# Patient Record
Sex: Male | Born: 1975
Health system: Southern US, Community
[De-identification: ages and names within clinical notes are randomized; demographics above are authoritative.]

## PROBLEM LIST (undated history)

## (undated) DIAGNOSIS — E785 Hyperlipidemia, unspecified: Secondary | ICD-10-CM

## (undated) DIAGNOSIS — B019 Varicella without complication: Secondary | ICD-10-CM

## (undated) DIAGNOSIS — L732 Hidradenitis suppurativa: Secondary | ICD-10-CM

## (undated) DIAGNOSIS — K612 Anorectal abscess: Secondary | ICD-10-CM

## (undated) DIAGNOSIS — I10 Essential (primary) hypertension: Secondary | ICD-10-CM

## (undated) DIAGNOSIS — L081 Erythrasma: Secondary | ICD-10-CM

## (undated) DIAGNOSIS — E781 Pure hyperglyceridemia: Secondary | ICD-10-CM

## (undated) HISTORY — DX: Varicella without complication: B01.9

## (undated) HISTORY — DX: Pure hyperglyceridemia: E78.1

## (undated) HISTORY — DX: Anorectal abscess: K61.2

## (undated) HISTORY — DX: Essential (primary) hypertension: I10

## (undated) HISTORY — DX: Erythrasma: L08.1

## (undated) HISTORY — DX: Hyperlipidemia, unspecified: E78.5

---

## 2003-10-26 ENCOUNTER — Ambulatory Visit: Payer: Self-pay | Admitting: General Practice

## 2005-02-22 ENCOUNTER — Ambulatory Visit: Payer: Self-pay | Admitting: General Practice

## 2006-05-01 ENCOUNTER — Ambulatory Visit: Payer: Self-pay | Admitting: General Practice

## 2007-10-27 ENCOUNTER — Ambulatory Visit: Payer: Self-pay | Admitting: General Practice

## 2017-04-25 ENCOUNTER — Ambulatory Visit: Payer: BLUE CROSS/BLUE SHIELD | Admitting: Primary Care

## 2017-04-25 ENCOUNTER — Encounter: Payer: Self-pay | Admitting: Primary Care

## 2017-04-25 ENCOUNTER — Encounter (INDEPENDENT_AMBULATORY_CARE_PROVIDER_SITE_OTHER): Payer: Self-pay

## 2017-04-25 VITALS — BP 154/94 | HR 93 | Temp 98.1°F | Ht 72.75 in | Wt 312.5 lb

## 2017-04-25 DIAGNOSIS — E781 Pure hyperglyceridemia: Secondary | ICD-10-CM | POA: Diagnosis not present

## 2017-04-25 DIAGNOSIS — I10 Essential (primary) hypertension: Secondary | ICD-10-CM | POA: Diagnosis not present

## 2017-04-25 HISTORY — DX: Essential (primary) hypertension: I10

## 2017-04-25 HISTORY — DX: Pure hyperglyceridemia: E78.1

## 2017-04-25 LAB — COMPREHENSIVE METABOLIC PANEL
ALT: 27 U/L (ref 0–53)
AST: 26 U/L (ref 0–37)
Albumin: 4.3 g/dL (ref 3.5–5.2)
Alkaline Phosphatase: 57 U/L (ref 39–117)
BILIRUBIN TOTAL: 0.6 mg/dL (ref 0.2–1.2)
BUN: 13 mg/dL (ref 6–23)
CO2: 26 meq/L (ref 19–32)
CREATININE: 0.8 mg/dL (ref 0.40–1.50)
Calcium: 9.4 mg/dL (ref 8.4–10.5)
Chloride: 99 mEq/L (ref 96–112)
GFR: 113.22 mL/min (ref >60.00)
GLUCOSE: 90 mg/dL (ref 70–99)
Potassium: 4.1 mEq/L (ref 3.5–5.1)
Sodium: 135 mEq/L (ref 135–145)
Total Protein: 8 g/dL (ref 6.0–8.3)

## 2017-04-25 LAB — HEMOGLOBIN A1C: HEMOGLOBIN A1C: 5 % (ref 4.6–6.5)

## 2017-04-25 LAB — LIPID PANEL
Cholesterol: 233 mg/dL — ABNORMAL HIGH (ref 0–200)
HDL: 38.2 mg/dL — AB (ref >39.00)
Total CHOL/HDL Ratio: 6
Triglycerides: 602 mg/dL — ABNORMAL HIGH (ref 0.0–149.0)

## 2017-04-25 LAB — LDL CHOLESTEROL, DIRECT: Direct LDL: 122 mg/dL

## 2017-04-25 MED ORDER — FENOFIBRATE 160 MG PO TABS: ORAL_TABLET | ORAL | 3 refills | Status: DC

## 2017-04-25 MED ORDER — AMLODIPINE BESYLATE 10 MG PO TABS: ORAL_TABLET | ORAL | 0 refills | Status: DC

## 2017-04-25 NOTE — Assessment & Plan Note (Signed)
Out of fenofibrate for several weeks, refills provided today. Discussed the importance of a healthy diet and regular exercise in order for weight loss, and to reduce the risk of any potential medical problems. Lipid panel pending today.

## 2017-04-25 NOTE — Progress Notes (Signed)
Subjective:    Patient ID: [name], male    DOB: 1975/01/31, 42 y.o.   MRN: [id number]  HPI  [name] is a 42 year old male who presents today to establish care and discuss the problems mentioned below. Will obtain old records.  1) Essential Hypertension: Diagnosed 5-6 years ago. He has family history of hypertension in mother and both sets of grandparents. Currently managed on lisinopril 20 mg. He would like to switch from lisinopril to another medication due to decrease in sexual drive. He ran out of his lisinopril 20 mg several weeks ago.   He checks his BP at home when on the medication which runs 130's/70's.   BP Readings from Last 3 Encounters:  04/25/17 (!) 154/94     2) Hypertriglyceridemia: Currently managed on fenofibrate 160 mg. His last cholesterol check was over one year ago. He ran out of his fenofibrate several weeks ago.   Diet currently consists of:  Breakfast: English muffin, bacon, egg whites; granola bar Lunch: Take out, fast food Dinner: Chicken, vegetables, steak, starch Snacks: Nuts, granola bar Desserts: None Beverages: Regular green tea, water, occasional soda, occasional alcohol  Exercise: He is exercising at home daily   Review of Systems  Constitutional: Negative for fatigue.  Eyes: Negative for visual disturbance.  Respiratory: Negative for shortness of breath.   Cardiovascular: Negative for chest pain.  Neurological: Negative for dizziness and headaches.  Psychiatric/Behavioral: The patient is nervous/anxious.        Past Medical History:  Diagnosis Date  . Hyperlipidemia   . Hypertension      Social History   Socioeconomic History  . Marital status: Single    Spouse name: Not on file  . Number of children: Not on file  . Years of education: Not on file  . Highest education level: Not on file  Occupational History  . Not on file  Social Needs  . Financial resource strain: Not on file  . Food insecurity:   Worry: Not on file    Inability: Not on file  . Transportation needs:    Medical: Not on file    Non-medical: Not on file  Tobacco Use  . Smoking status: Never Smoker  . Smokeless tobacco: Never Used  Substance and Sexual Activity  . Alcohol use: Yes  . Drug use: Never  . Sexual activity: Not on file  Lifestyle  . Physical activity:    Days per week: Not on file    Minutes per session: Not on file  . Stress: Not on file  Relationships  . Social connections:    Talks on phone: Not on file    Gets together: Not on file    Attends religious service: Not on file    Active member of club or organization: Not on file    Attends meetings of clubs or organizations: Not on file    Relationship status: Not on file  . Intimate partner violence:    Fear of current or ex partner: Not on file    Emotionally abused: Not on file    Physically abused: Not on file    Forced sexual activity: Not on file  Other Topics Concern  . Not on file  Social History Narrative   Married.   No children.   Works as Medical illustrator.    Enjoys playing guitar, lifting weights, walking.     Family History  Problem Relation Age of Onset  . Arthritis Mother   .  Diabetes Mother   . Heart disease Mother   . Hyperlipidemia Mother   . Hypertension Mother   . Cancer Father        lung  . COPD Father   . Arthritis Sister   . Arthritis Maternal Grandmother   . Diabetes Maternal Grandmother   . Heart disease Maternal Grandmother   . Hyperlipidemia Maternal Grandmother   . Hypertension Maternal Grandmother   . Arthritis Maternal Grandfather   . Heart attack Maternal Grandfather   . Heart disease Maternal Grandfather   . Hyperlipidemia Maternal Grandfather   . Hypertension Maternal Grandfather   . Arthritis Paternal Grandmother   . Melanoma Paternal Grandmother   . Hyperlipidemia Paternal Grandmother   . Hypertension Paternal Grandmother   . Arthritis Paternal Grandfather   . Diabetes  Paternal Grandfather   . Heart attack Paternal Grandfather   . Hyperlipidemia Paternal Grandfather   . Hypertension Paternal Grandfather     No Known Allergies  Current Outpatient Medications on File Prior to Visit  Medication Sig Dispense Refill  . APPLE CIDER VINEGAR PO Take 450 mg by mouth daily.    . B Complex-Biotin-FA (B-COMPLEX PO) Take 35 mg by mouth daily.    . famotidine (PEPCID) 20 MG tablet Take 20 mg by mouth daily.    . Misc Natural Products (RELAX & SLEEP PO) Take 325 mg by mouth daily.    Marland Kitchen. UNABLE TO FIND Take 2,000 mg by mouth 2 (two) times daily. Med Name: BEET ROOT     No current facility-administered medications on file prior to visit.     BP (!) 154/94   Pulse 93   Temp 98.1 F (36.7 C) (Oral)   Ht 6' 0.75" (1.848 m)   Wt (!) 312 lb 8 oz (141.7 kg)   SpO2 98%   BMI 41.51 kg/m    Objective:   Physical Exam  Constitutional: He is oriented to person, place, and time. He appears well-nourished.  Neck: Neck supple.  Cardiovascular: Normal rate and regular rhythm.  Pulmonary/Chest: Effort normal and breath sounds normal. He has no wheezes. He has no rales.  Neurological: He is alert and oriented to person, place, and time.  Skin: Skin is warm and dry.  Psychiatric: He has a normal mood and affect.          Assessment & Plan:

## 2017-04-25 NOTE — Assessment & Plan Note (Signed)
Above goal, off BP meds for 2 weeks. Rx for Amlodipine 10 mg tablets sent to pharmacy given side effects of decreased libido with lisinopril.  Follow up in 2-3 weeks for blood pressure check

## 2017-04-25 NOTE — Patient Instructions (Addendum)
Stop by the lab prior to leaving today. I will notify you of your results once received.   Start amlodipine 10 mg tablets for high blood pressure. Take 1 tablet by mouth once daily.   Resume your fenofibrate tablets.   Schedule a follow up visit in 2-3 weeks for blood pressure check and anxiety.   It was a pleasure to meet you today! Please don't hesitate to call or message me with any questions. Welcome to [facility]!

## 2017-05-09 ENCOUNTER — Encounter: Payer: Self-pay | Admitting: Primary Care

## 2017-05-09 ENCOUNTER — Ambulatory Visit: Payer: BLUE CROSS/BLUE SHIELD | Admitting: Primary Care

## 2017-05-09 VITALS — BP 160/84 | HR 110 | Temp 98.0°F | Ht 72.75 in | Wt 313.0 lb

## 2017-05-09 DIAGNOSIS — F411 Generalized anxiety disorder: Secondary | ICD-10-CM

## 2017-05-09 DIAGNOSIS — E781 Pure hyperglyceridemia: Secondary | ICD-10-CM

## 2017-05-09 DIAGNOSIS — I10 Essential (primary) hypertension: Secondary | ICD-10-CM | POA: Diagnosis not present

## 2017-05-09 MED ORDER — ESCITALOPRAM OXALATE 10 MG PO TABS: 10.0000 mg | ORAL_TABLET | Freq: Every day | ORAL | 1 refills | Status: DC

## 2017-05-09 NOTE — Progress Notes (Signed)
Subjective:    Patient ID: [name], male    DOB: 01/12/1976, 42 y.o.   MRN: [id number]  HPI  [name] is a 42 year old male who presents today for follow up of hypertension.   He was last evaluated in mid April 2019 with complaints of elevated blood pressure readings since off of lisinopril 20 mg. He was switched to Amlodipine 10 mg as he experienced decreased sexual libido with lisinopril 20 mg.   BP Readings from Last 3 Encounters:  05/09/17 (!) 160/92  04/25/17 (!) 154/94   Since his last visit he's been checking his BP at home which has been running 130/80's, sometimes lower. 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 He denies headaches, dizziness, chest pain.   He thinks his elevated blood pressure readings are secondary to stress and anxiety. He endorses daily worry, anxiety, stress. Symptoms have been present for about one year and are affecting his work and home life. He is interested in trying treatment for anxiety. GAD 7 score of 12 today.   Review of Systems  Eyes: Negative for visual disturbance.  Respiratory: Negative for shortness of breath.   Cardiovascular: Negative for chest pain.  Neurological: Negative for dizziness and headaches.  Psychiatric/Behavioral: The patient is nervous/anxious.        See HPI       Past Medical History:  Diagnosis Date  . Hyperlipidemia   . Hypertension      Social History   Socioeconomic History  . Marital status: Single    Spouse name: Not on file  . Number of children: Not on file  . Years of education: Not on file  . Highest education level: Not on file  Occupational History  . Not on file  Social Needs  . Financial resource strain: Not on file  . Food insecurity:    Worry: Not on file    Inability: Not on file  . Transportation needs:    Medical: Not on file    Non-medical: Not on file  Tobacco Use  .  Smoking status: Never Smoker  . Smokeless tobacco: Never Used  Substance and Sexual Activity  . Alcohol use: Yes  . Drug use: Never  . Sexual activity: Not on file  Lifestyle  . Physical activity:    Days per week: Not on file    Minutes per session: Not on file  . Stress: Not on file  Relationships  . Social connections:    Talks on phone: Not on file    Gets together: Not on file    Attends religious service: Not on file    Active member of club or organization: Not on file    Attends meetings of clubs or organizations: Not on file    Relationship status: Not on file  . Intimate partner violence:    Fear of current or ex partner: Not on file    Emotionally abused: Not on file    Physically abused: Not on file    Forced sexual activity: Not on file  Other Topics Concern  . Not on file  Social History Narrative   Married.   No children.   Works as Medical illustratorengineering technician.    Enjoys playing guitar, lifting weights, walking.      Family History  Problem Relation Age of Onset  . Arthritis Mother   . Diabetes Mother   .  Heart disease Mother   . Hyperlipidemia Mother   . Hypertension Mother   . Cancer Father        lung  . COPD Father   . Arthritis Sister   . Arthritis Maternal Grandmother   . Diabetes Maternal Grandmother   . Heart disease Maternal Grandmother   . Hyperlipidemia Maternal Grandmother   . Hypertension Maternal Grandmother   . Arthritis Maternal Grandfather   . Heart attack Maternal Grandfather   . Heart disease Maternal Grandfather   . Hyperlipidemia Maternal Grandfather   . Hypertension Maternal Grandfather   . Arthritis Paternal Grandmother   . Melanoma Paternal Grandmother   . Hyperlipidemia Paternal Grandmother   . Hypertension Paternal Grandmother   . Arthritis Paternal Grandfather   . Diabetes Paternal Grandfather   . Heart attack Paternal Grandfather   . Hyperlipidemia Paternal Grandfather   . Hypertension Paternal Grandfather     No  Known Allergies  Current Outpatient Medications on File Prior to Visit  Medication Sig Dispense Refill  . amLODipine (NORVASC) 10 MG tablet Take 1 tablet by mouth once daily for high blood pressure. 30 tablet 0  . APPLE CIDER VINEGAR PO Take 450 mg by mouth daily.    . B Complex-Biotin-FA (B-COMPLEX PO) Take 35 mg by mouth daily.    . famotidine (PEPCID) 20 MG tablet Take 20 mg by mouth daily.    . fenofibrate 160 MG tablet Take 1 tablet by mouth once daily for high cholesterol. 90 tablet 3  . Misc Natural Products (RELAX & SLEEP PO) Take 325 mg by mouth daily.    Marland Kitchen UNABLE TO FIND Take 2,000 mg by mouth 2 (two) times daily. Med Name: BEET ROOT     No current facility-administered medications on file prior to visit.     BP (!) 160/92 (BP Location: Left Arm, Patient Position: Sitting, Cuff Size: Large)   Pulse (!) 110   Temp 98 F (36.7 C) (Oral)   Ht 6' 0.75" (1.848 m)   Wt (!) 313 lb (142 kg)   SpO2 97%   BMI 41.58 kg/m    Objective:   Physical Exam  Constitutional: He appears well-nourished.  Neck: Neck supple.  Cardiovascular: Normal rate and regular rhythm.  Pulmonary/Chest: Effort normal and breath sounds normal.  Skin: Skin is warm and dry.  Psychiatric:  Appears nervous during exam          Assessment & Plan:

## 2017-05-09 NOTE — Patient Instructions (Signed)
Start escitalopram (Lexapro) 10 mg tablets for anxiety. Start by taking 1/2 tablet daily for 6 days, then increase to 1 full tablet thereafter.  Please call me if you have any problems with Lexapro.  Continue to monitor your blood pressure once daily at home, report readings at or above 135/90.  It's important to improve your diet by reducing consumption of fast food, fried food, processed snack foods, sugary drinks. Increase consumption of fresh vegetables and fruits, whole grains, water.  Ensure you are drinking 64 ounces of water daily.  Start exercising. You should be getting 150 minutes of moderate intensity exercise weekly.  Please schedule a follow up appointment in 1 month.  It was a pleasure to see you today!   DASH Eating Plan DASH stands for "Dietary Approaches to Stop Hypertension." The DASH eating plan is a healthy eating plan that has been shown to reduce high blood pressure (hypertension). It may also reduce your risk for type 2 diabetes, heart disease, and stroke. The DASH eating plan may also help with weight loss. What are tips for following this plan? General guidelines  Avoid eating more than 2,300 mg (milligrams) of salt (sodium) a day. If you have hypertension, you may need to reduce your sodium intake to 1,500 mg a day.  Limit alcohol intake to no more than 1 drink a day for nonpregnant women and 2 drinks a day for men. One drink equals 12 oz of beer, 5 oz of wine, or 1 oz of hard liquor.  Work with your health care provider to maintain a healthy body weight or to lose weight. Ask what an ideal weight is for you.  Get at least 30 minutes of exercise that causes your heart to beat faster (aerobic exercise) most days of the week. Activities may include walking, swimming, or biking.  Work with your health care provider or diet and nutrition specialist (dietitian) to adjust your eating plan to your individual calorie needs. Reading food labels  Check food labels  for the amount of sodium per serving. Choose foods with less than 5 percent of the Daily Value of sodium. Generally, foods with less than 300 mg of sodium per serving fit into this eating plan.  To find whole grains, look for the word "whole" as the first word in the ingredient list. Shopping  Buy products labeled as "low-sodium" or "no salt added."  Buy fresh foods. Avoid canned foods and premade or frozen meals. Cooking  Avoid adding salt when cooking. Use salt-free seasonings or herbs instead of table salt or sea salt. Check with your health care provider or pharmacist before using salt substitutes.  Do not fry foods. Cook foods using healthy methods such as baking, boiling, grilling, and broiling instead.  Cook with heart-healthy oils, such as olive, canola, soybean, or sunflower oil. Meal planning   Eat a balanced diet that includes: ? 5 or more servings of fruits and vegetables each day. At each meal, try to fill half of your plate with fruits and vegetables. ? Up to 6-8 servings of whole grains each day. ? Less than 6 oz of lean meat, poultry, or fish each day. A 3-oz serving of meat is about the same size as a deck of cards. One egg equals 1 oz. ? 2 servings of low-fat dairy each day. ? A serving of nuts, seeds, or beans 5 times each week. ? Heart-healthy fats. Healthy fats called Omega-3 fatty acids are found in foods such as flaxseeds and coldwater fish,  like sardines, salmon, and mackerel.  Limit how much you eat of the following: ? Canned or prepackaged foods. ? Food that is high in trans fat, such as fried foods. ? Food that is high in saturated fat, such as fatty meat. ? Sweets, desserts, sugary drinks, and other foods with added sugar. ? Full-fat dairy products.  Do not salt foods before eating.  Try to eat at least 2 vegetarian meals each week.  Eat more home-cooked food and less restaurant, buffet, and fast food.  When eating at a restaurant, ask that your food  be prepared with less salt or no salt, if possible. What foods are recommended? The items listed may not be a complete list. Talk with your dietitian about what dietary choices are best for you. Grains Whole-grain or whole-wheat bread. Whole-grain or whole-wheat pasta. Brown rice. Orpah Cobbatmeal. Quinoa. Bulgur. Whole-grain and low-sodium cereals. Pita bread. Low-fat, low-sodium crackers. Whole-wheat flour tortillas. Vegetables Fresh or frozen vegetables (raw, steamed, roasted, or grilled). Low-sodium or reduced-sodium tomato and vegetable juice. Low-sodium or reduced-sodium tomato sauce and tomato paste. Low-sodium or reduced-sodium canned vegetables. Fruits All fresh, dried, or frozen fruit. Canned fruit in natural juice (without added sugar). Meat and other protein foods Skinless chicken or Malaw[place]urkey. Ground chicken or [place]urkey. Pork with fat trimmed off. Fish and seafood. Egg whites. Dried beans, peas, or lentils. Unsalted nuts, nut butters, and seeds. Unsalted canned beans. Lean cuts of beef with fat trimmed off. Low-sodium, lean deli meat. Dairy Low-fat (1%) or fat-free (skim) milk. Fat-free, low-fat, or reduced-fat cheeses. Nonfat, low-sodium ricotta or cottage cheese. Low-fat or nonfat yogurt. Low-fat, low-sodium cheese. Fats and oils Soft margarine without trans fats. Vegetable oil. Low-fat, reduced-fat, or light mayonnaise and salad dressings (reduced-sodium). Canola, safflower, olive, soybean, and sunflower oils. Avocado. Seasoning and other foods Herbs. Spices. Seasoning mixes without salt. Unsalted popcorn and pretzels. Fat-free sweets. What foods are not recommended? The items listed may not be a complete list. Talk with your dietitian about what dietary choices are best for you. Grains Baked goods made with fat, such as croissants, muffins, or some breads. Dry pasta or rice meal packs. Vegetables Creamed or fried vegetables. Vegetables in a cheese sauce. Regular canned vegetables (not  low-sodium or reduced-sodium). Regular canned tomato sauce and paste (not low-sodium or reduced-sodium). Regular tomato and vegetable juice (not low-sodium or reduced-sodium). Rosita FirePickles. Olives. Fruits Canned fruit in a light or heavy syrup. Fried fruit. Fruit in cream or butter sauce. Meat and other protein foods Fatty cuts of meat. Ribs. Fried meat. Tomasa BlaseBacon. Sausage. Bologna and other processed lunch meats. Salami. Fatback. Hotdogs. Bratwurst. Salted nuts and seeds. Canned beans with added salt. Canned or smoked fish. Whole eggs or egg yolks. Chicken or Malaw[place]urkey with skin. Dairy Whole or 2% milk, cream, and half-and-half. Whole or full-fat cream cheese. Whole-fat or sweetened yogurt. Full-fat cheese. Nondairy creamers. Whipped toppings. Processed cheese and cheese spreads. Fats and oils Butter. Stick margarine. Lard. Shortening. Ghee. Bacon fat. Tropical oils, such as coconut, palm kernel, or palm oil. Seasoning and other foods Salted popcorn and pretzels. Onion salt, garlic salt, seasoned salt, table salt, and sea salt. Worcestershire sauce. Tartar sauce. Barbecue sauce. Teriyaki sauce. Soy sauce, including reduced-sodium. Steak sauce. Canned and packaged gravies. Fish sauce. Oyster sauce. Cocktail sauce. Horseradish that you find on the shelf. Ketchup. Mustard. Meat flavorings and tenderizers. Bouillon cubes. Hot sauce and Tabasco sauce. Premade or packaged marinades. Premade or packaged taco seasonings. Relishes. Regular salad dressings. Where to find more information:  National Heart, Lung, and Blood Institute: PopSteam.is  American Heart Association: www.heart.org Summary  The DASH eating plan is a healthy eating plan that has been shown to reduce high blood pressure (hypertension). It may also reduce your risk for type 2 diabetes, heart disease, and stroke.  With the DASH eating plan, you should limit salt (sodium) intake to 2,300 mg a day. If you have hypertension, you may need to reduce  your sodium intake to 1,500 mg a day.  When on the DASH eating plan, aim to eat more fresh fruits and vegetables, whole grains, lean proteins, low-fat dairy, and heart-healthy fats.  Work with your health care provider or diet and nutrition specialist (dietitian) to adjust your eating plan to your individual calorie needs. This information is not intended to replace advice given to you by your health care provider. Make sure you discuss any questions you have with your health care provider. Document Released: 12/21/2010 Document Revised: 12/26/2015 Document Reviewed: 12/26/2015 Elsevier Interactive Patient Education  [employer].

## 2017-05-09 NOTE — Assessment & Plan Note (Addendum)
Home BP readings running 130/80 on average, some times lower. Do suspect white coat syndrome and anxiety to be playing a role in high BP readings during office visits. It's hard to believe that his BP today is "worse" after initiation of medication. GAD 7 score of 12 today.  Given that his home readings are within normal range, clear evidence of anxiety, will treat anxiety today and will have him closely monitor home BP and report readings at or above 135/90.   Follow up in 4 weeks.

## 2017-05-09 NOTE — Assessment & Plan Note (Signed)
Resumed fenofibrate last visit, has been working on diet. Repeat cholesterol levels during next visit.

## 2017-05-09 NOTE — Assessment & Plan Note (Addendum)
GAD 7 score of 12 today. Do believe that anxiety is playing a role in elevated blood pressure readings during office visits.   Discussed different options for treatment, he opts for medication. Rx for Lexapro 10 mg tablets sent to pharmacy. Patient is to take 1/2 tablet daily for 8 days, then advance to 1 full tablet thereafter. We discussed possible side effects of headache, GI upset, drowsiness, and SI/HI. If thoughts of SI/HI develop, we discussed to present to the emergency immediately. Patient verbalized understanding.   Follow up in 4 weeks for re-evaluation.    Follow up in 4 weeks for re-evaluation.

## 2017-06-04 ENCOUNTER — Encounter: Payer: Self-pay | Admitting: Internal Medicine

## 2017-06-04 ENCOUNTER — Ambulatory Visit: Payer: BLUE CROSS/BLUE SHIELD | Admitting: Internal Medicine

## 2017-06-04 VITALS — BP 166/102 | HR 88 | Temp 98.7°F | Wt 306.0 lb

## 2017-06-04 DIAGNOSIS — M109 Gout, unspecified: Secondary | ICD-10-CM | POA: Diagnosis not present

## 2017-06-04 DIAGNOSIS — L03032 Cellulitis of left toe: Secondary | ICD-10-CM

## 2017-06-04 MED ORDER — PREDNISONE 10 MG PO TABS: ORAL_TABLET | ORAL | 0 refills | Status: DC

## 2017-06-04 MED ORDER — CEPHALEXIN 500 MG PO CAPS: 500.0000 mg | ORAL_CAPSULE | Freq: Three times a day (TID) | ORAL | 0 refills | Status: DC

## 2017-06-05 ENCOUNTER — Encounter: Payer: Self-pay | Admitting: Internal Medicine

## 2017-06-05 NOTE — Patient Instructions (Signed)

## 2017-06-05 NOTE — Progress Notes (Signed)
Subjective:    Patient ID: [name], male    DOB: 1975-06-02, 42 y.o.   MRN: [id number]  HPI  Pt presents to the clinic today with with c/o left foot pain, swelling and redness. He reports this started 1 week ago. He reports most of his pain is at the joint at the base of the left great toe. He describes the pain as sharp and throbbing. Most of the redness and swelling is located around the edge of the nail bed. He reports he had a callus on the side of the toe that he shaved off. After that, he started to notice the redness, pain and swelling. He has not taken anything OTC for his symptoms. He does have a history of gout and reports this feels the same.   Review of Systems      Past Medical History:  Diagnosis Date  . Hyperlipidemia   . Hypertension     Current Outpatient Medications  Medication Sig Dispense Refill  . amLODipine (NORVASC) 10 MG tablet Take 1 tablet by mouth once daily for high blood pressure. 30 tablet 0  . APPLE CIDER VINEGAR PO Take 450 mg by mouth daily.    . B Complex-Biotin-FA (B-COMPLEX PO) Take 35 mg by mouth daily.    Marland Kitchen escitalopram (LEXAPRO) 10 MG tablet Take 1 tablet (10 mg total) by mouth daily. 30 tablet 1  . famotidine (PEPCID) 20 MG tablet Take 20 mg by mouth daily.    . fenofibrate 160 MG tablet Take 1 tablet by mouth once daily for high cholesterol. 90 tablet 3  . Misc Natural Products (RELAX & SLEEP PO) Take 325 mg by mouth daily.    Marland Kitchen UNABLE TO FIND Take 2,000 mg by mouth 2 (two) times daily. Med Name: BEET ROOT    . cephALEXin (KEFLEX) 500 MG capsule Take 1 capsule (500 mg total) by mouth 3 (three) times daily. 30 capsule 0  . predniSONE (DELTASONE) 10 MG tablet Take 6 tabs day 1, 5 tabs day 2, 4 tabs day 3, 3 tabs day 4, 2 tabs day 5, 1 tab day 6 21 tablet 0   No current facility-administered medications for this visit.     No Known Allergies  Family History  Problem Relation Age of Onset  . Arthritis Mother   . Diabetes  Mother   . Heart disease Mother   . Hyperlipidemia Mother   . Hypertension Mother   . Cancer Father        lung  . COPD Father   . Arthritis Sister   . Arthritis Maternal Grandmother   . Diabetes Maternal Grandmother   . Heart disease Maternal Grandmother   . Hyperlipidemia Maternal Grandmother   . Hypertension Maternal Grandmother   . Arthritis Maternal Grandfather   . Heart attack Maternal Grandfather   . Heart disease Maternal Grandfather   . Hyperlipidemia Maternal Grandfather   . Hypertension Maternal Grandfather   . Arthritis Paternal Grandmother   . Melanoma Paternal Grandmother   . Hyperlipidemia Paternal Grandmother   . Hypertension Paternal Grandmother   . Arthritis Paternal Grandfather   . Diabetes Paternal Grandfather   . Heart attack Paternal Grandfather   . Hyperlipidemia Paternal Grandfather   . Hypertension Paternal Grandfather     Social History   Socioeconomic History  . Marital status: Single    Spouse name: Not on file  . Number of children: Not on file  . Years of education: Not on file  .  Highest education level: Not on file  Occupational History  . Not on file  Social Needs  . Financial resource strain: Not on file  . Food insecurity:    Worry: Not on file    Inability: Not on file  . Transportation needs:    Medical: Not on file    Non-medical: Not on file  Tobacco Use  . Smoking status: Never Smoker  . Smokeless tobacco: Never Used  Substance and Sexual Activity  . Alcohol use: Yes  . Drug use: Never  . Sexual activity: Not on file  Lifestyle  . Physical activity:    Days per week: Not on file    Minutes per session: Not on file  . Stress: Not on file  Relationships  . Social connections:    Talks on phone: Not on file    Gets together: Not on file    Attends religious service: Not on file    Active member of club or organization: Not on file    Attends meetings of clubs or organizations: Not on file    Relationship status:  Not on file  . Intimate partner violence:    Fear of current or ex partner: Not on file    Emotionally abused: Not on file    Physically abused: Not on file    Forced sexual activity: Not on file  Other Topics Concern  . Not on file  Social History Narrative   Married.   No children.   Works as Medical illustrator.    Enjoys playing guitar, lifting weights, walking.     Constitutional: Denies fever, malaise, fatigue, headache or abrupt weight changes.  Musculoskeletal: Pt reports left foot pain and swelling. Denies decrease in range of motion, difficulty with gait, muscle pain.  Skin: Pt reports redness and warmth of left foot. Denies rashes, lesions or ulcercations.   No other specific complaints in a complete review of systems (except as listed in HPI above).  Objective:   Physical Exam   BP (!) 166/102   Pulse 88   Temp 98.7 F (37.1 C) (Oral)   Wt (!) 306 lb (138.8 kg)   SpO2 98%   BMI 40.65 kg/m  Wt Readings from Last 3 Encounters:  06/04/17 (!) 306 lb (138.8 kg)  05/09/17 (!) 313 lb (142 kg)  04/25/17 (!) 312 lb 8 oz (141.7 kg)    General: Appears his stated age, well developed, well nourished in NAD. Skin: Open callus to left medial great toe, appears to have some pus just under the skin. Redness and swelling to the lateral nail bed concerning for paronychia. Musculoskeletal: Redness, swelling and pain with palpation over the left MTP.    BMET    Component Value Date/Time   NA 135 04/25/2017 0959   K 4.1 04/25/2017 0959   CL 99 04/25/2017 0959   CO2 26 04/25/2017 0959   GLUCOSE 90 04/25/2017 0959   BUN 13 04/25/2017 0959   CREATININE 0.80 04/25/2017 0959   CALCIUM 9.4 04/25/2017 0959    Lipid Panel     Component Value Date/Time   CHOL 233 (H) 04/25/2017 0959   TRIG (H) 04/25/2017 0959    602.0 Triglyceride is over 400; calculations on Lipids are invalid.   HDL 38.20 (L) 04/25/2017 0959   CHOLHDL 6 04/25/2017 0959    CBC No results found  for: WBC, RBC, HGB, HCT, PLT, MCV, MCH, MCHC, RDW, LYMPHSABS, MONOABS, EOSABS, BASOSABS  Hgb A1C Lab Results  Component Value Date  HGBA1C 5.0 04/25/2017           Assessment & Plan:   Paronychia of Left Great Toe:  Epsom Salt soaks for 10 minutes daily eRx for Keflex 500 mg TID x 10 days  Gout of Left MTP:  eRx for Pred Taper x 6 days Encouraged elevation and ice Discussed foods/medicatoins to avoid  Return precautions discussed Nicki Reaper, NP

## 2017-06-11 ENCOUNTER — Encounter: Payer: Self-pay | Admitting: Primary Care

## 2017-06-11 ENCOUNTER — Telehealth: Payer: Self-pay | Admitting: Primary Care

## 2017-06-11 ENCOUNTER — Ambulatory Visit: Payer: BLUE CROSS/BLUE SHIELD | Admitting: Primary Care

## 2017-06-11 VITALS — BP 144/80 | HR 108 | Temp 98.0°F | Ht 72.75 in | Wt 294.2 lb

## 2017-06-11 DIAGNOSIS — L03317 Cellulitis of buttock: Secondary | ICD-10-CM | POA: Diagnosis not present

## 2017-06-11 LAB — CBC WITH DIFFERENTIAL/PLATELET
BASOS PCT: 3.6 % — AB (ref 0.0–3.0)
Basophils Absolute: 0.7 10*3/uL — ABNORMAL HIGH (ref 0.0–0.1)
EOS PCT: 0.5 % (ref 0.0–5.0)
Eosinophils Absolute: 0.1 10*3/uL (ref 0.0–0.7)
HEMATOCRIT: 42.8 % (ref 39.0–52.0)
HEMOGLOBIN: 14.8 g/dL (ref 13.0–17.0)
LYMPHS PCT: 8 % — AB (ref 12.0–46.0)
Lymphs Abs: 1.6 10*3/uL (ref 0.7–4.0)
MCHC: 34.7 g/dL (ref 30.0–36.0)
MCV: 91.1 fl (ref 78.0–100.0)
Monocytes Absolute: 2.5 10*3/uL — ABNORMAL HIGH (ref 0.1–1.0)
Monocytes Relative: 12.5 % — ABNORMAL HIGH (ref 3.0–12.0)
NEUTROS ABS: 14.8 10*3/uL — AB (ref 1.4–7.7)
Neutrophils Relative %: 75.4 % (ref 43.0–77.0)
PLATELETS: 390 10*3/uL (ref 150.0–400.0)
RBC: 4.7 Mil/uL (ref 4.22–5.81)
RDW: 13.5 % (ref 11.5–15.5)

## 2017-06-11 LAB — BASIC METABOLIC PANEL
BUN: 17 mg/dL (ref 6–23)
CO2: 24 mEq/L (ref 19–32)
Calcium: 9.6 mg/dL (ref 8.4–10.5)
Chloride: 95 mEq/L — ABNORMAL LOW (ref 96–112)
Creatinine, Ser: 0.97 mg/dL (ref 0.40–1.50)
GFR: 90.59 mL/min (ref >60.00)
GLUCOSE: 115 mg/dL — AB (ref 70–99)
POTASSIUM: 4 meq/L (ref 3.5–5.1)
Sodium: 132 mEq/L — ABNORMAL LOW (ref 135–145)

## 2017-06-11 MED ORDER — SULFAMETHOXAZOLE-TRIMETHOPRIM 800-160 MG PO TABS: 1.0000 | ORAL_TABLET | Freq: Two times a day (BID) | ORAL | 0 refills | Status: DC

## 2017-06-11 MED ORDER — TRAMADOL HCL 50 MG PO TABS: 50.0000 mg | ORAL_TABLET | Freq: Three times a day (TID) | ORAL | 0 refills | Status: DC | PRN

## 2017-06-11 NOTE — Telephone Encounter (Signed)
Noted  

## 2017-06-11 NOTE — Patient Instructions (Addendum)
Start Bactrim DS (sulfamethoxazole/trimethoprim) tablets for infection. Take 1 tablet by mouth twice daily for 10 days.  Try using a Sitz bath for discomfort to the buttocks area.   Stop by the lab prior to leaving today. I will notify you of your results once received.   You may take Tramadol every 8 hours as needed for pain.   Please schedule a follow up visit for Thursday this week. Please go to the hospital sooner if you start to feel worse.   It was a pleasure to see you today!

## 2017-06-11 NOTE — Progress Notes (Signed)
Subjective:    Patient ID: [name], male    DOB: 10/22/1975, 42 y.o.   MRN: [id number]  HPI  Mr. Fakhouri is a 42 year old male who presents today with a chief complaint of buttocks and rectal pain. His symptoms began one week ago after getting off of a golf cart.He's often getting in and out of golf carts during the day in the heat.    His symptoms have progressed over the past weekend and are now severe.  He thinks he had fevers over the weekend while at the beach, did not check his temperature but felt chills with sweats.   He describes his pain as a constant knife like pain shooting into his buttocks and rectum. He'll have to roll over to lay down, cannot apply direct pressure to his buttocks without pain. He's been using preporation H wipes and cream OTC with very little improvement in symptoms. He denies rectal bleeding, constipation, diarrhea, abdominal pain, vomiting.   He was treated one week ago for paronychia and acute gout, treated with Cephalexin and prednisone courses for which he took for 4 days and quit as they caused nausea. He's been taking Ibuprofen and Tylenol for pain without improvement. His last dose of Ibuprofen was yesterday.  Review of Systems  Constitutional: Positive for chills.  Respiratory: Negative for cough.   Gastrointestinal: Negative for abdominal pain, blood in stool, constipation, diarrhea and nausea.  Skin:       Painful buttocks  Hematological: Negative for adenopathy.       Past Medical History:  Diagnosis Date  . Hyperlipidemia   . Hypertension      Social History   Socioeconomic History  . Marital status: Single    Spouse name: Not on file  . Number of children: Not on file  . Years of education: Not on file  . Highest education level: Not on file  Occupational History  . Not on file  Social Needs  . Financial resource strain: Not on file  . Food insecurity:    Worry: Not on file    Inability: Not on file  .  Transportation needs:    Medical: Not on file    Non-medical: Not on file  Tobacco Use  . Smoking status: Never Smoker  . Smokeless tobacco: Never Used  Substance and Sexual Activity  . Alcohol use: Yes  . Drug use: Never  . Sexual activity: Not on file  Lifestyle  . Physical activity:    Days per week: Not on file    Minutes per session: Not on file  . Stress: Not on file  Relationships  . Social connections:    Talks on phone: Not on file    Gets together: Not on file    Attends religious service: Not on file    Active member of club or organization: Not on file    Attends meetings of clubs or organizations: Not on file    Relationship status: Not on file  . Intimate partner violence:    Fear of current or ex partner: Not on file    Emotionally abused: Not on file    Physically abused: Not on file    Forced sexual activity: Not on file  Other Topics Concern  . Not on file  Social History Narrative   Married.   No children.   Works as Medical illustrator.    Enjoys playing guitar, lifting weights, walking.    No past surgical history on  file.  Family History  Problem Relation Age of Onset  . Arthritis Mother   . Diabetes Mother   . Heart disease Mother   . Hyperlipidemia Mother   . Hypertension Mother   . Cancer Father        lung  . COPD Father   . Arthritis Sister   . Arthritis Maternal Grandmother   . Diabetes Maternal Grandmother   . Heart disease Maternal Grandmother   . Hyperlipidemia Maternal Grandmother   . Hypertension Maternal Grandmother   . Arthritis Maternal Grandfather   . Heart attack Maternal Grandfather   . Heart disease Maternal Grandfather   . Hyperlipidemia Maternal Grandfather   . Hypertension Maternal Grandfather   . Arthritis Paternal Grandmother   . Melanoma Paternal Grandmother   . Hyperlipidemia Paternal Grandmother   . Hypertension Paternal Grandmother   . Arthritis Paternal Grandfather   . Diabetes Paternal Grandfather     . Heart attack Paternal Grandfather   . Hyperlipidemia Paternal Grandfather   . Hypertension Paternal Grandfather     No Known Allergies  Current Outpatient Medications on File Prior to Visit  Medication Sig Dispense Refill  . amLODipine (NORVASC) 10 MG tablet Take 1 tablet by mouth once daily for high blood pressure. 30 tablet 0  . APPLE CIDER VINEGAR PO Take 450 mg by mouth daily.    . B Complex-Biotin-FA (B-COMPLEX PO) Take 35 mg by mouth daily.    . cephALEXin (KEFLEX) 500 MG capsule Take 1 capsule (500 mg total) by mouth 3 (three) times daily. 30 capsule 0  . escitalopram (LEXAPRO) 10 MG tablet Take 1 tablet (10 mg total) by mouth daily. 30 tablet 1  . famotidine (PEPCID) 20 MG tablet Take 20 mg by mouth daily.    . fenofibrate 160 MG tablet Take 1 tablet by mouth once daily for high cholesterol. 90 tablet 3  . Misc Natural Products (RELAX & SLEEP PO) Take 325 mg by mouth daily.    . predniSONE (DELTASONE) 10 MG tablet Take 6 tabs day 1, 5 tabs day 2, 4 tabs day 3, 3 tabs day 4, 2 tabs day 5, 1 tab day 6 21 tablet 0  . UNABLE TO FIND Take 2,000 mg by mouth 2 (two) times daily. Med Name: BEET ROOT     No current facility-administered medications on file prior to visit.     BP (!) 144/80   Pulse (!) 108   Temp 98 F (36.7 C) (Oral)   Ht 6' 0.75" (1.848 m)   Wt 294 lb 4 oz (133.5 kg)   SpO2 98%   BMI 39.09 kg/m    Objective:   Physical Exam  Constitutional: He appears well-nourished.  Cardiovascular: Regular rhythm.  Sinus tachycardia  Genitourinary: Rectal exam shows external hemorrhoid.     Genitourinary Comments: 2-3 mm non thrombosed hemorrhoid to external rectum. Moderate erythema with firm mass to right buttocks.   Skin:     Moderate erythema and firm mass to left buttocks in a vertical layout. No scrotal involvement.            Assessment & Plan:  Cellulitis:  Noted to right buttocks. No obvious rectal/scrotal cause, no open wounds. Moderate  cellulitis present. Check CBC with diff and BMP. Rx for Bactrim DS tablets provided given inability to tolerate Cephalexin. Rx for Tramadol provided to use PRN pain. Discussed sitz baths, donut pillow. Strict emergency precautions provided. Follow up in 2 days.  Update: CBC today with moderate-sever leukocytosis, left  shift. He was tachycardic in office but appeared stable and was afebrile. Strict ED precautions provided again if no improvement in symptoms. Will see him in 2 days for follow up.   Doreene Nest, NP

## 2017-06-11 NOTE — Telephone Encounter (Signed)
FMLA paperwork in [employer] in box °

## 2017-06-11 NOTE — Telephone Encounter (Signed)
[facility] lab called with critical results. White Blood Count 19.7 Results given to Mayra Reel, NP at 11:15am -Wendi Maya, RTR

## 2017-06-12 NOTE — Telephone Encounter (Signed)
Completed and placed on [facility] desk. 

## 2017-06-13 ENCOUNTER — Encounter: Payer: Self-pay | Admitting: Primary Care

## 2017-06-13 ENCOUNTER — Emergency Department: Payer: BLUE CROSS/BLUE SHIELD

## 2017-06-13 ENCOUNTER — Emergency Department
Admission: EM | Admit: 2017-06-13 | Discharge: 2017-06-13 | Disposition: A | Discharge status: Home / Self Care | Payer: BLUE CROSS/BLUE SHIELD | Attending: Emergency Medicine | Admitting: Emergency Medicine

## 2017-06-13 ENCOUNTER — Other Ambulatory Visit: Payer: Self-pay

## 2017-06-13 ENCOUNTER — Ambulatory Visit: Payer: BLUE CROSS/BLUE SHIELD | Admitting: Primary Care

## 2017-06-13 VITALS — BP 124/62 | HR 97 | Temp 97.9°F | Ht 72.75 in | Wt 294.2 lb

## 2017-06-13 DIAGNOSIS — L03818 Cellulitis of other sites: Secondary | ICD-10-CM | POA: Diagnosis present

## 2017-06-13 DIAGNOSIS — L0231 Cutaneous abscess of buttock: Secondary | ICD-10-CM

## 2017-06-13 DIAGNOSIS — E871 Hypo-osmolality and hyponatremia: Secondary | ICD-10-CM | POA: Diagnosis not present

## 2017-06-13 DIAGNOSIS — L03317 Cellulitis of buttock: Secondary | ICD-10-CM

## 2017-06-13 DIAGNOSIS — K612 Anorectal abscess: Secondary | ICD-10-CM | POA: Diagnosis not present

## 2017-06-13 DIAGNOSIS — Z79899 Other long term (current) drug therapy: Secondary | ICD-10-CM | POA: Insufficient documentation

## 2017-06-13 DIAGNOSIS — I10 Essential (primary) hypertension: Secondary | ICD-10-CM | POA: Diagnosis not present

## 2017-06-13 DIAGNOSIS — K611 Rectal abscess: Secondary | ICD-10-CM | POA: Diagnosis not present

## 2017-06-13 LAB — COMPREHENSIVE METABOLIC PANEL
ALT: 31 U/L (ref 17–63)
AST: 29 U/L (ref 15–41)
Albumin: 3.3 g/dL — ABNORMAL LOW (ref 3.5–5.0)
Alkaline Phosphatase: 90 U/L (ref 38–126)
Anion gap: 14 (ref 5–15)
BUN: 18 mg/dL (ref 6–20)
CHLORIDE: 89 mmol/L — AB (ref 101–111)
CO2: 23 mmol/L (ref 22–32)
CREATININE: 1.19 mg/dL (ref 0.61–1.24)
Calcium: 9.2 mg/dL (ref 8.9–10.3)
Glucose, Bld: 111 mg/dL — ABNORMAL HIGH (ref 65–99)
Potassium: 4 mmol/L (ref 3.5–5.1)
Sodium: 126 mmol/L — ABNORMAL LOW (ref 135–145)
Total Bilirubin: 0.7 mg/dL (ref 0.3–1.2)
Total Protein: 8.5 g/dL — ABNORMAL HIGH (ref 6.5–8.1)

## 2017-06-13 LAB — CBC WITH DIFFERENTIAL/PLATELET
Basophils Absolute: 0 10*3/uL (ref 0–0.1)
Basophils Relative: 0 %
EOS PCT: 1 %
Eosinophils Absolute: 0.1 10*3/uL (ref 0–0.7)
HCT: 42.4 % (ref 40.0–52.0)
Hemoglobin: 14.8 g/dL (ref 13.0–18.0)
LYMPHS ABS: 1 10*3/uL (ref 1.0–3.6)
LYMPHS PCT: 4 %
MCH: 31.5 pg (ref 26.0–34.0)
MCHC: 34.8 g/dL (ref 32.0–36.0)
MCV: 90.7 fL (ref 80.0–100.0)
MONO ABS: 1.9 10*3/uL — AB (ref 0.2–1.0)
MONOS PCT: 8 %
Neutro Abs: 20 10*3/uL — ABNORMAL HIGH (ref 1.4–6.5)
Neutrophils Relative %: 87 %
PLATELETS: 486 10*3/uL — AB (ref 150–440)
RBC: 4.68 MIL/uL (ref 4.40–5.90)
RDW: 13.6 % (ref 11.5–14.5)
WBC: 23 10*3/uL — AB (ref 3.8–10.6)

## 2017-06-13 MED ORDER — SODIUM CHLORIDE 0.9 % IV BOLUS
1000.0000 mL | Freq: Once | INTRAVENOUS | Status: AC
  Administered 2017-06-13: 1000 mL via INTRAVENOUS

## 2017-06-13 MED ORDER — OXYCODONE-ACETAMINOPHEN 5-325 MG PO TABS
2.0000 | ORAL_TABLET | Freq: Once | ORAL | Status: AC
  Administered 2017-06-13: 2 via ORAL
  Filled 2017-06-13: qty 2

## 2017-06-13 MED ORDER — PIPERACILLIN-TAZOBACTAM 3.375 G IVPB 30 MIN
3.3750 g | Freq: Once | INTRAVENOUS | Status: AC
  Administered 2017-06-13: 3.375 g via INTRAVENOUS
  Filled 2017-06-13: qty 50

## 2017-06-13 MED ORDER — LIDOCAINE-EPINEPHRINE (PF) 2 %-1:200000 IJ SOLN
20.0000 mL | Freq: Once | INTRAMUSCULAR | Status: DC
  Filled 2017-06-13 (×2): qty 20

## 2017-06-13 MED ORDER — IOPAMIDOL (ISOVUE-300) INJECTION 61%
100.0000 mL | Freq: Once | INTRAVENOUS | Status: AC | PRN
  Administered 2017-06-13: 100 mL via INTRAVENOUS

## 2017-06-13 MED ORDER — SULFAMETHOXAZOLE-TRIMETHOPRIM 800-160 MG PO TABS: 1.0000 | ORAL_TABLET | Freq: Two times a day (BID) | ORAL | 0 refills | Status: DC

## 2017-06-13 MED ORDER — LIDOCAINE HCL (PF) 1 % IJ SOLN
20.0000 mL | Freq: Once | INTRAMUSCULAR | Status: AC
  Administered 2017-06-13: 20 mL via INTRADERMAL

## 2017-06-13 MED ORDER — LIDOCAINE HCL (PF) 1 % IJ SOLN
INTRAMUSCULAR | Status: AC
  Administered 2017-06-13: 20 mL via INTRADERMAL
  Filled 2017-06-13: qty 10

## 2017-06-13 MED ORDER — CEPHALEXIN 500 MG PO CAPS: 500.0000 mg | ORAL_CAPSULE | Freq: Three times a day (TID) | ORAL | 0 refills | Status: DC

## 2017-06-13 MED ORDER — ONDANSETRON HCL 4 MG/2ML IJ SOLN
4.0000 mg | Freq: Once | INTRAMUSCULAR | Status: AC
  Administered 2017-06-13: 4 mg via INTRAVENOUS
  Filled 2017-06-13: qty 2

## 2017-06-13 MED ORDER — HYDROMORPHONE HCL 1 MG/ML IJ SOLN
1.0000 mg | Freq: Once | INTRAMUSCULAR | Status: AC
  Administered 2017-06-13: 1 mg via INTRAVENOUS
  Filled 2017-06-13: qty 1

## 2017-06-13 NOTE — ED Notes (Signed)
First Nurse Note:  Patient sent from [place] at [facility] with cellulitis of the buttocks.  WBC - 19.7 per report.  Alert and oriented.

## 2017-06-13 NOTE — Telephone Encounter (Signed)
Patient aware Copy for pt Copy for scan

## 2017-06-13 NOTE — ED Triage Notes (Signed)
Pt reports that he has "cellulitis" on his buttocks - pt is on Septra DS - the area looks better - PCP requested pt to come to ED for IV atb

## 2017-06-13 NOTE — Telephone Encounter (Signed)
Paperwork faxed °

## 2017-06-13 NOTE — Progress Notes (Signed)
Subjective:    Patient ID: [name], male    DOB: [birth year], 42 y.o.   MRN: [id number]  HPI  [name] is a 42 year old male who presents today for follow up of cellulitis.  He was last evaluated two days ago with reports of buttocks and rectal pain. He was found to have cellulitis of the left buttocks,  WBC count of 19 that was noted later that day. He was treated with oral Bactrim DS tablets and instructed to follow up.  Since his last visit he's noticed some improvement but is still in a moderate amount of discomfort. He denies fevers since Tuesday. He continues to have little appetite, is drinking Pedialyte and protein drinks. He had one episode of vomiting 2 days ago, this was liquid mostly.   Review of Systems  Constitutional: Positive for fatigue. Negative for fever.  Respiratory: Negative for shortness of breath.   Cardiovascular: Negative for chest pain.  Skin: Positive for color change and wound.       Past Medical History:  Diagnosis Date  . Hyperlipidemia   . Hypertension      Social History   Socioeconomic History  . Marital status: Single    Spouse name: Not on file  . Number of children: Not on file  . Years of education: Not on file  . Highest education level: Not on file  Occupational History  . Not on file  Social Needs  . Financial resource strain: Not on file  . Food insecurity:    Worry: Not on file    Inability: Not on file  . Transportation needs:    Medical: Not on file    Non-medical: Not on file  Tobacco Use  . Smoking status: Never Smoker  . Smokeless tobacco: Never Used  Substance and Sexual Activity  . Alcohol use: Yes  . Drug use: Never  . Sexual activity: Not on file  Lifestyle  . Physical activity:    Days per week: Not on file    Minutes per session: Not on file  . Stress: Not on file  Relationships  . Social connections:    Talks on phone: Not on file    Gets together: Not on file    Attends religious  service: Not on file    Active member of club or organization: Not on file    Attends meetings of clubs or organizations: Not on file    Relationship status: Not on file  . Intimate partner violence:    Fear of current or ex partner: Not on file    Emotionally abused: Not on file    Physically abused: Not on file    Forced sexual activity: Not on file  Other Topics Concern  . Not on file  Social History Narrative   Married.   No children.   Works as Medical illustrator.    Enjoys playing guitar, lifting weights, walking.    No past surgical history on file.  Family History  Problem Relation Age of Onset  . Arthritis Mother   . Diabetes Mother   . Heart disease Mother   . Hyperlipidemia Mother   . Hypertension Mother   . Cancer Father        lung  . COPD Father   . Arthritis Sister   . Arthritis Maternal Grandmother   . Diabetes Maternal Grandmother   . Heart disease Maternal Grandmother   . Hyperlipidemia Maternal Grandmother   . Hypertension Maternal Grandmother   .  Arthritis Maternal Grandfather   . Heart attack Maternal Grandfather   . Heart disease Maternal Grandfather   . Hyperlipidemia Maternal Grandfather   . Hypertension Maternal Grandfather   . Arthritis Paternal Grandmother   . Melanoma Paternal Grandmother   . Hyperlipidemia Paternal Grandmother   . Hypertension Paternal Grandmother   . Arthritis Paternal Grandfather   . Diabetes Paternal Grandfather   . Heart attack Paternal Grandfather   . Hyperlipidemia Paternal Grandfather   . Hypertension Paternal Grandfather     No Known Allergies  Current Outpatient Medications on File Prior to Visit  Medication Sig Dispense Refill  . amLODipine (NORVASC) 10 MG tablet Take 1 tablet by mouth once daily for high blood pressure. 30 tablet 0  . APPLE CIDER VINEGAR PO Take 450 mg by mouth daily.    . B Complex-Biotin-FA (B-COMPLEX PO) Take 35 mg by mouth daily.    . cephALEXin (KEFLEX) 500 MG capsule Take 1  capsule (500 mg total) by mouth 3 (three) times daily. 30 capsule 0  . escitalopram (LEXAPRO) 10 MG tablet Take 1 tablet (10 mg total) by mouth daily. 30 tablet 1  . famotidine (PEPCID) 20 MG tablet Take 20 mg by mouth daily.    . fenofibrate 160 MG tablet Take 1 tablet by mouth once daily for high cholesterol. 90 tablet 3  . Misc Natural Products (RELAX & SLEEP PO) Take 325 mg by mouth daily.    . predniSONE (DELTASONE) 10 MG tablet Take 6 tabs day 1, 5 tabs day 2, 4 tabs day 3, 3 tabs day 4, 2 tabs day 5, 1 tab day 6 21 tablet 0  . sulfamethoxazole-trimethoprim (BACTRIM DS,SEPTRA DS) 800-160 MG tablet Take 1 tablet by mouth 2 (two) times daily. 20 tablet 0  . traMADol (ULTRAM) 50 MG tablet Take 1 tablet (50 mg total) by mouth every 8 (eight) hours as needed for severe pain. 15 tablet 0  . UNABLE TO FIND Take 2,000 mg by mouth 2 (two) times daily. Med Name: BEET ROOT     No current facility-administered medications on file prior to visit.     BP 124/62   Pulse 97   Temp 97.9 F (36.6 C) (Oral)   Ht 6' 0.75" (1.848 m)   Wt 294 lb 4 oz (133.5 kg)   SpO2 98%   BMI 39.09 kg/m    Objective:   Physical Exam  Constitutional: He appears well-nourished.  Cardiovascular: Normal rate and regular rhythm.  Skin: Skin is warm and dry.  Moderate erythema and firmness to right inner and medial buttocks, fluctuant now to inner buttocks near rectum. Small amount of stool remnants noted. Moderate tenderness/discomfort. Unable to sit.           Assessment & Plan:  Cellulitis:  Initially evaluated Tuesday this week, treated with oral Bactrim DS tablets. WBC count returned later that day with count of 19. Today he's not much improved, major change includes fluctuant tissue to right inner buttocks near rectum.  Likely needs to undergo I&D, and given location of wound near rectum would be very reticent to do this procedure in the office given potential for infection. Also needs IV antibiotics and  repeat labs.  Patient sent to [facility] ED for further treatment. Johny Drilling called report to the RN in Triage to expect patient.   Doreene Nest, NP

## 2017-06-13 NOTE — ED Notes (Signed)
Pt states he is having some drainage from the site.

## 2017-06-13 NOTE — Discharge Instructions (Signed)
Take Bactrim and Keflex to continue treating your soft tissue infection.  Keep the area of the draining abscess clean and dry. Follow up in surgery clinic  in 4-5 days to continue monitoring your progress. Return to the ED if your condition worsens.

## 2017-06-13 NOTE — Procedures (Signed)
SURGICAL OPERATIVE REPORT  DATE OF PROCEDURE: 06/13/2017  ATTENDING: Barbara Cower E. Earlene Plater, MD  ANESTHESIA: Local  PRE-OPERATIVE DIAGNOSIS: Anorectal abscess (icd-10's: K61.2)  POST-OPERATIVE DIAGNOSIS: Anorectal abscess (icd-10's: K61.2)  PROCEDURE(S):  1.) Incision and drainage of large complex Right > Left anorectal abscess communicating towards scrotum and across midline (cpt: 16109)  INTRAOPERATIVE FINDINGS: >300 mL of foul-smelling brownish purulent fluid under significant pressure erupted and further drained from large and loculated Right > Left deep anorectal abscess with complete disruption of all loculations and substantial relief with improved surrounding erythema and patient well-able to tolerate drainage and packing  INTRAVENOUS FLUIDS: 0 mL crystalloid   ESTIMATED BLOOD LOSS: Minimal (<20 mL)   URINE OUTPUT: No Foley catheter   SPECIMENS: Contents of anorectal abscess for culture  IMPLANTS: None  DRAINS: Abscess cavity filled with rolled gauze packing  COMPLICATIONS: None apparent  CONDITION AT END OF PROCEDURE: Hemodynamically stable and awake, much more comfortable than prior to abscess drainage  INDICATIONS FOR PROCEDURE:  Patient is a 42 y.o. male who presented to [facility] ED for peri-anorectal pain, erythema, and swelling x nearly 1 week, for which patient was started on oral antibiotics x 2 days by patient's primary care physician. All risks, benefits, and alternatives to bedside vs OR incision and drainage of anorectal abscess were discussed with the patient and his wife, all of patient's and his wife's questions were answered to their expressed satisfaction, patient expressed his wish to proceed with bedside incision and drainage, and informed consent was accordingly obtained and documented at that time.  DETAILS OF PROCEDURE: Patient was appropriately identified. With patient laying on his Right side,  operative site was prepped and draped in the usual fashion using betadine, and following a brief time out, the focus of maximal fluctuance was identified and a 2 cm incision was made oriented away from anus using an #11 blade scalpel, upon which >300 mL of foul-smelling brownish-purulent fluid immediately erupted under significant pressure from patient's wound with immediate substantial relief expressed, and deep abscess culture was obtained. Additional similar fluid was then expressed, and intra-abscess septations/loculations were then disrupted using a hemostat (superficially) and then more extensively using a gloved finger in every direction, including towards patient's perineum/scrotum, parallel to his rectum, and his buttock. The abscess cavity was then irrigated, and dry gauze was inserted and removed, after which a roll of gauze was advanced into the former abscess cavity to promote drainage and prevent closure of the skin over the drained abscess cavity. Surrounding skin was then cleaned and dried, and clean dry mesh underwear was applied. I was present for all of the above procedure, and no complications were apparent.

## 2017-06-13 NOTE — ED Notes (Signed)
First Nurse Note:  Patient to Room 3, Stephaine RN aware of placement in room.

## 2017-06-13 NOTE — Patient Instructions (Signed)
Go to the emergency department for further evaluation of the wound.  Tell them you had labs done Tuesday and that you've taken Bactrim DS tablets.   It was a pleasure to see you today!

## 2017-06-13 NOTE — Consult Note (Signed)
SURGICAL CONSULTATION NOTE (initial) - cpt: 99244  HISTORY OF PRESENT ILLNESS (HPI):  42 y.o. male presented to [facility] ED today for evaluation of severe peri-anal / buttock pain. Patient reports he first started "not feeling well" approximately 1 week ago, just before traveling to [facility] [place] for the weekend, during which he describes he laid in bed and was unable to enjoy due to his pain. He accordingly presented to his primary care physician and was started on Bactrim 2 days ago. Throughout this past week, his pain, erythema, and swelling have continued to worsen, prompting ED evaluation today. While in the ED, patient and his wife describe his Right buttock started to drain purulent fluid on his boxers, bed, and the floor with only slight relief. He otherwise denies fever/chills, N/V, CP, or SOB and has continued to pass +flatus and +BM's, though pain worse with BM's or any other application of buttock/anal pressure.  Surgery is consulted by ED physician Dr. Scotty Court in this context for evaluation and management of peri-anal abscess.  PAST MEDICAL HISTORY (PMH):  Past Medical History:  Diagnosis Date  . Hyperlipidemia   . Hypertension      PAST SURGICAL HISTORY (PSH):  History reviewed. No pertinent surgical history.   MEDICATIONS:  Prior to Admission medications   Medication Sig Start Date End Date Taking? Authorizing Provider  amLODipine (NORVASC) 10 MG tablet Take 1 tablet by mouth once daily for high blood pressure. 04/25/17  Yes Doreene Nest, NP  APPLE CIDER VINEGAR PO Take 450 mg by mouth daily.   Yes [provider]  B Complex-Biotin-FA (B-COMPLEX PO) Take 35 mg by mouth daily.   Yes [provider]  cephALEXin (KEFLEX) 500 MG capsule Take 1 capsule (500 mg total) by mouth 3 (three) times daily. 06/04/17  Yes Lorre Munroe, NP  escitalopram (LEXAPRO) 10 MG tablet Take 1 tablet (10 mg total) by mouth daily. 05/09/17  Yes Doreene Nest, NP  famotidine  (PEPCID) 20 MG tablet Take 20 mg by mouth daily.   Yes [provider]  fenofibrate 160 MG tablet Take 1 tablet by mouth once daily for high cholesterol. 04/25/17  Yes Doreene Nest, NP  Misc Natural Products (RELAX & SLEEP PO) Take 325 mg by mouth daily.   Yes [provider]  sulfamethoxazole-trimethoprim (BACTRIM DS,SEPTRA DS) 800-160 MG tablet Take 1 tablet by mouth 2 (two) times daily. 06/11/17  Yes Doreene Nest, NP  traMADol (ULTRAM) 50 MG tablet Take 1 tablet (50 mg total) by mouth every 8 (eight) hours as needed for severe pain. 06/11/17  Yes Doreene Nest, NP  UNABLE TO FIND Take 2,000 mg by mouth 2 (two) times daily. Med Name: BEET ROOT   Yes [provider]  predniSONE (DELTASONE) 10 MG tablet Take 6 tabs day 1, 5 tabs day 2, 4 tabs day 3, 3 tabs day 4, 2 tabs day 5, 1 tab day 6 Patient not taking: Reported on 06/13/2017 06/04/17   Lorre Munroe, NP     ALLERGIES:  No Known Allergies   SOCIAL HISTORY:  Social History   Socioeconomic History  . Marital status: Married    Spouse name: Not on file  . Number of children: Not on file  . Years of education: Not on file  . Highest education level: Not on file  Occupational History  . Not on file  Social Needs  . Financial resource strain: Not on file  . Food insecurity:  Worry: Not on file    Inability: Not on file  . Transportation needs:    Medical: Not on file    Non-medical: Not on file  Tobacco Use  . Smoking status: Never Smoker  . Smokeless tobacco: Never Used  Substance and Sexual Activity  . Alcohol use: Yes  . Drug use: Never  . Sexual activity: Not on file  Lifestyle  . Physical activity:    Days per week: Not on file    Minutes per session: Not on file  . Stress: Not on file  Relationships  . Social connections:    Talks on phone: Not on file    Gets together: Not on file    Attends religious service: Not on file    Active member of club or organization: Not on  file    Attends meetings of clubs or organizations: Not on file    Relationship status: Not on file  . Intimate partner violence:    Fear of current or ex partner: Not on file    Emotionally abused: Not on file    Physically abused: Not on file    Forced sexual activity: Not on file  Other Topics Concern  . Not on file  Social History Narrative   Married.   No children.   Works as Medical illustrator.    Enjoys playing guitar, lifting weights, walking.    The patient currently resides (home / rehab facility / nursing home): Home The patient normally is (ambulatory / bedbound): Ambulatory   FAMILY HISTORY:  Family History  Problem Relation Age of Onset  . Arthritis Mother   . Diabetes Mother   . Heart disease Mother   . Hyperlipidemia Mother   . Hypertension Mother   . Cancer Father        lung  . COPD Father   . Arthritis Sister   . Arthritis Maternal Grandmother   . Diabetes Maternal Grandmother   . Heart disease Maternal Grandmother   . Hyperlipidemia Maternal Grandmother   . Hypertension Maternal Grandmother   . Arthritis Maternal Grandfather   . Heart attack Maternal Grandfather   . Heart disease Maternal Grandfather   . Hyperlipidemia Maternal Grandfather   . Hypertension Maternal Grandfather   . Arthritis Paternal Grandmother   . Melanoma Paternal Grandmother   . Hyperlipidemia Paternal Grandmother   . Hypertension Paternal Grandmother   . Arthritis Paternal Grandfather   . Diabetes Paternal Grandfather   . Heart attack Paternal Grandfather   . Hyperlipidemia Paternal Grandfather   . Hypertension Paternal Grandfather      REVIEW OF SYSTEMS:  Constitutional: denies weight loss, fever, chills, or sweats  Eyes: denies any other vision changes, history of eye injury  ENT: denies sore throat, hearing problems  Respiratory: denies shortness of breath, wheezing  Cardiovascular: denies chest pain, palpitations  Gastrointestinal: abdominal pain, N/V, and  bowel function as per HPI Genitourinary: denies burning with urination or urinary frequency Musculoskeletal: denies any other joint pains or cramps  Skin: denies any other rashes or skin discolorations  Neurological: denies any other headache, dizziness, weakness  Psychiatric: denies any other depression, anxiety   All other review of systems were negative   VITAL SIGNS:  Temp:  [97.7 F (36.5 C)-97.9 F (36.6 C)] 97.7 F (36.5 C) (05/30 0955) Pulse Rate:  [83-98] 83 (05/30 1336) Resp:  [14-16] 16 (05/30 1336) BP: (124-138)/(62-79) 138/73 (05/30 1336) SpO2:  [96 %-98 %] 96 % (05/30 1336) Weight:  [294 lb (133.4  kg)-294 lb 4 oz (133.5 kg)] 294 lb (133.4 kg) (05/30 0955)     Height:  (185.4 cm) Weight: 294 lb (133.4 kg) BMI (Calculated): 38.8   INTAKE/OUTPUT:  This shift: Total I/O In: 1000 [IV Piggyback:1000] Out: -   Last 2 shifts: @   PHYSICAL EXAM:  Constitutional:  -- Obese body habitus  -- Awake, alert, and oriented x3, no apparent distress Eyes:  -- Pupils equally round and reactive to light  -- No scleral icterus, B/L no occular discharge Ear, nose, throat: -- Neck is FROM WNL -- No jugular venous distension  Pulmonary:  -- No wheezes or rhales -- Equal breath sounds bilaterally -- Breathing non-labored at rest Cardiovascular:  -- S1, S2 present  -- No pericardial rubs  Gastrointestinal:  -- Abdomen soft, nontender, non-distended, no guarding or rebound tenderness -- No abdominal masses appreciated, pulsatile or otherwise  Anorectal: -- Right > Left tender erythematous fluctuant swelling with small focus draining copious purulent fluid without any crepitus -- anal sphincter tone WNL and no gross blood or appreciable fissures or fistula Musculoskeletal and Integumentary:  -- Wounds or skin discoloration: None appreciated except as described above (anorectal) -- Extremities: B/L UE and LE FROM, hands and feet warm, no edema  Neurologic:  --  Motor function: Intact and symmetric -- Sensation: Intact and symmetric Psychiatric:  -- Mood and affect WNL  Labs:  CBC Latest Ref Rng & Units 06/13/2017 06/11/2017  WBC 3.8 - 10.6 K/uL 23.0(H) 19.7 Repeated and verified X2.(HH)  Hemoglobin 13.0 - 18.0 g/dL 54.0 98.1  Hematocrit 19.1 - 52.0 % 42.4 42.8  Platelets 150 - 440 K/uL 486(H) 390.0   CMP Latest Ref Rng & Units 06/13/2017 06/11/2017 04/25/2017  Glucose 65 - 99 mg/dL 478(G) 956(O) 90  BUN 6 - 20 mg/dL Creatinine 0.61 - 1.24 mg/dL 1.30 8.65 7.84  Sodium 135 - 145 mmol/L 126(L) 132(L) 135  Potassium 3.5 - 5.1 mmol/L 4.0 4.0 4.1  Chloride 101 - 111 mmol/L 89(L) 95(L) 99  CO2 22 - 32 mmol/L Calcium 8.9 - 10.3 mg/dL 9.2 9.6 9.4  Total Protein 6.5 - 8.1 g/dL 6.9(G) - 8.0  Total Bilirubin 0.3 - 1.2 mg/dL 0.7 - 0.6  Alkaline Phos 38 - 126 U/L 90 - 57  AST 15 - 41 U/L 29 - 26  ALT 17 - 63 U/L 31 - 27   Imaging studies:  CT Abdomen and Pelvis with Contrast (06/13/2017) - personally reviewed and discussed with patient, his wife, and ED physician There is a multiloculated fluid collection in the perineal region that measures 16.8 x 9.9 x 7.2 cm. This fluid collection surrounds the anus and extends anteriorly and superiorly into the base of the penis. This is consistent with abscess.  Assessment/Plan: (ICD-10's: K68.2) 42 y.o. male with a very large and painful inadequately draining anorectal abscess with no clinical evidence to suggest necrotizing soft tissue infection, complicated by pertinent comorbidities including HTN, HLD, and GERD.   - pain control prn  - CT findings discussed with patient and his wife  - antibiotics to cover gram positive and gram negative bacteria  - all risks, benefits, and alternatives to bedside vs OR incision and drainage of large anorectal abscess were discussed with the patient and his wife, all of their questions were answered to their expressed satisfaction, patient expresses he  wishes to proceed with bedside incision and drainage, and informed consent was accordingly obtained and documented.   -  bedside incision and drainage of large anorectal abscess was performed with >300 mL of brownish-purulent fluid obtained consistent with CT findings, and patient was able to tolerate with substantial relief drainage including blunt gloved finger and hemostat disruption of loculations and packing  - considering drainage results consistent with CT findings, patient's substantial relief following well-tolerated procedure, and well-informed patient and wife, discharge home with close outpatient surgical follow-up as requested by patient and his wife is acceptable  - instructed removal of rolled gauze packing tomorrow by patient's wife and completion of prescribed antibiotic (such as doxycycline)  - reasons to call surgery office or return to ED clearly and specifically discussed with patient and his wife  - may repeat CBC (outpatient or ED) +/- CT if not feeling markedly better as described to be expected  - medical management of comorbidities (home medications)  All of the above findings and recommendations were discussed with the patient and his wife, and all of patient's and his wife's questions were answered to their expressed satisfaction.  Thank you for the opportunity to participate in this patient's care.   -- Scherrie Gerlach Earlene Plater, MD, RPVI [place]: [facility] - Partnering for exceptional care. Office: [phone]

## 2017-06-13 NOTE — ED Provider Notes (Signed)
[facility] Emergency Department Provider Note  ____________________________________________  Time seen: Approximately 2:06 PM  I have reviewed the triage vital signs and the nursing notes.   HISTORY  Chief Complaint No chief complaint on file.    HPI [name] is a 42 y.o. male with a history of hypertension and hyperlipidemia sent to the ED for evaluation of a gluteal cellulitis. He was started on Bactrim 2 days ago by his primary care doctor and reports that the area is improving but was told to come to the ED for IV antibiotic/primary care doctor today. Denies fevers or chills or abdominal pain. Gluteal pain is nonradiating, worse with pressure, no alleviating factors, moderate intensity, aching.      Past Medical History:  Diagnosis Date  . Hyperlipidemia   . Hypertension      Patient Active Problem List   Diagnosis Date Noted  . GAD (generalized anxiety disorder) 05/09/2017  . Essential hypertension 04/25/2017  . Hypertriglyceridemia 04/25/2017     History reviewed. No pertinent surgical history.   Prior to Admission medications   Medication Sig Start Date End Date Taking? Authorizing Provider  amLODipine (NORVASC) 10 MG tablet Take 1 tablet by mouth once daily for high blood pressure. 04/25/17  Yes Doreene Nest, NP  APPLE CIDER VINEGAR PO Take 450 mg by mouth daily.   Yes [provider]  B Complex-Biotin-FA (B-COMPLEX PO) Take 35 mg by mouth daily.   Yes [provider]  cephALEXin (KEFLEX) 500 MG capsule Take 1 capsule (500 mg total) by mouth 3 (three) times daily. 06/04/17  Yes Lorre Munroe, NP  escitalopram (LEXAPRO) 10 MG tablet Take 1 tablet (10 mg total) by mouth daily. 05/09/17  Yes Doreene Nest, NP  famotidine (PEPCID) 20 MG tablet Take 20 mg by mouth daily.   Yes [provider]  fenofibrate 160 MG tablet Take 1 tablet by mouth once daily for high cholesterol. 04/25/17  Yes  Doreene Nest, NP  Misc Natural Products (RELAX & SLEEP PO) Take 325 mg by mouth daily.   Yes [provider]  sulfamethoxazole-trimethoprim (BACTRIM DS,SEPTRA DS) 800-160 MG tablet Take 1 tablet by mouth 2 (two) times daily. 06/11/17  Yes Doreene Nest, NP  traMADol (ULTRAM) 50 MG tablet Take 1 tablet (50 mg total) by mouth every 8 (eight) hours as needed for severe pain. 06/11/17  Yes Doreene Nest, NP  UNABLE TO FIND Take 2,000 mg by mouth 2 (two) times daily. Med Name: BEET ROOT   Yes [provider]  predniSONE (DELTASONE) 10 MG tablet Take 6 tabs day 1, 5 tabs day 2, 4 tabs day 3, 3 tabs day 4, 2 tabs day 5, 1 tab day 6 Patient not taking: Reported on 06/13/2017 06/04/17   Lorre Munroe, NP     Allergies Patient has no known allergies.   Family History  Problem Relation Age of Onset  . Arthritis Mother   . Diabetes Mother   . Heart disease Mother   . Hyperlipidemia Mother   . Hypertension Mother   . Cancer Father        lung  . COPD Father   . Arthritis Sister   . Arthritis Maternal Grandmother   . Diabetes Maternal Grandmother   . Heart disease Maternal Grandmother   . Hyperlipidemia Maternal Grandmother   . Hypertension Maternal Grandmother   . Arthritis Maternal Grandfather   . Heart attack Maternal Grandfather   . Heart disease  Maternal Grandfather   . Hyperlipidemia Maternal Grandfather   . Hypertension Maternal Grandfather   . Arthritis Paternal Grandmother   . Melanoma Paternal Grandmother   . Hyperlipidemia Paternal Grandmother   . Hypertension Paternal Grandmother   . Arthritis Paternal Grandfather   . Diabetes Paternal Grandfather   . Heart attack Paternal Grandfather   . Hyperlipidemia Paternal Grandfather   . Hypertension Paternal Grandfather     Social History Social History   Tobacco Use  . Smoking status: Never Smoker  . Smokeless tobacco: Never Used  Substance Use Topics  . Alcohol use: Yes  . Drug use: Never     Review of Systems  Constitutional:   No fever or chills.  ENT:   No sore throat. No rhinorrhea. Cardiovascular:   No chest pain or syncope. Respiratory:   No dyspnea or cough. Gastrointestinal:   Negative for abdominal pain, vomiting and diarrhea.  Musculoskeletal:   positive as above for gluteal pain and swelling All other systems reviewed and are negative except as documented above in ROS and HPI.  ____________________________________________   PHYSICAL EXAM:  VITAL SIGNS: ED Triage Vitals  Enc Vitals Group     BP 06/13/17 0955 134/79     Pulse Rate 06/13/17 0955 98     Resp 06/13/17 0955 14     Temp 06/13/17 0955 97.7 F (36.5 C)     Temp Source 06/13/17 0955 Oral     SpO2 06/13/17 0955 98 %     Weight 06/13/17 0955 294 lb (133.4 kg)     Height 06/13/17 0955  (1.854 m)     Head Circumference --      Peak Flow --      Pain Score 06/13/17 0954 7     Pain Loc --      Pain Edu? --      Excl. in GC? --     Vital signs reviewed, nursing assessments reviewed.   Constitutional:   Alert and oriented. Well appearing and in no distress. Eyes:   Conjunctivae are normal. EOMI. PERRL. ENT      Head:   Normocephalic and atraumatic.      Nose:   No congestion/rhinnorhea.       Mouth/Throat:   MMM, no pharyngeal erythema. No peritonsillar mass.       Neck:   No meningismus. Full ROM. Hematological/Lymphatic/Immunilogical:   No cervical lymphadenopathy. Cardiovascular:   RRR. Symmetric bilateral radial and DP pulses.  No murmurs.  Respiratory:   Normal respiratory effort without tachypnea/retractions. Breath sounds are clear and equal bilaterally. No wheezes/rales/rhonchi. Gastrointestinal:   Soft with mild lower abdominal tenderness. Non distended. There is no CVA tenderness.  No rebound, rigidity, or guarding. there is a large area of induration and fluctuance on the bilateral gluteus perianally. No spontaneous drainage Genitourinary:   normal, no hernia or perineal  inflammation Musculoskeletal:   Normal range of motion in all extremities. No joint effusions.  No lower extremity tenderness.  No edema. Neurologic:   Normal speech and language.  Motor grossly intact. No acute focal neurologic deficits are appreciated.  Skin:    Skin is warm, dry with inflammatory changes of the above ____________________________________________    LABS (pertinent positives/negatives) (all labs ordered are listed, but only abnormal results are displayed) Labs Reviewed  CBC WITH DIFFERENTIAL/PLATELET - Abnormal; Notable for the following components:      Result Value   WBC 23.0 (*)    Platelets 486 (*)    Neutro  Abs 20.0 (*)    Monocytes Absolute 1.9 (*)    All other components within normal limits  COMPREHENSIVE METABOLIC PANEL - Abnormal; Notable for the following components:   Sodium 126 (*)    Chloride 89 (*)    Glucose, Bld 111 (*)    Total Protein 8.5 (*)    Albumin 3.3 (*)    All other components within normal limits   ____________________________________________   EKG    ____________________________________________    RADIOLOGY  Ct Abdomen Pelvis W Contrast  Result Date: 06/13/2017 CLINICAL DATA:  Cellulitis of the buttocks. Acute generalized abdominal pain. EXAM: CT ABDOMEN AND PELVIS WITH CONTRAST TECHNIQUE: Multidetector CT imaging of the abdomen and pelvis was performed using the standard protocol following bolus administration of intravenous contrast. CONTRAST:  ISOVUE-300 IOPAMIDOL (ISOVUE-300) INJECTION 61% COMPARISON:  None. FINDINGS: Lower chest: No acute abnormality. Hepatobiliary: No focal liver abnormality is seen. No gallstones, gallbladder wall thickening, or biliary dilatation. Pancreas: Unremarkable. No pancreatic ductal dilatation or surrounding inflammatory changes. Spleen: Normal in size without focal abnormality. Adrenals/Urinary Tract: Adrenal glands appear normal. No renal or ureteral calculi are noted. Mild bilateral  hydroureteronephrosis is noted secondary to distended urinary bladder. This is concerning for bladder outlet obstruction. Stomach/Bowel: The stomach appears normal. The appendix appears normal. There is no evidence of bowel obstruction. Vascular/Lymphatic: No significant vascular findings are present. No enlarged abdominal or pelvic lymph nodes. Reproductive: Prostate is unremarkable. Other: There is a multiloculated fluid collection in the perineal region that measures 16.8 x 9.9 x 7.2 cm. This fluid collection surrounds the anus and extends anteriorly and superiorly into the base of the penis. This is consistent with abscess. This is concerning for developing Fournier's gangrene. Musculoskeletal: No acute or significant osseous findings. IMPRESSION: 16.8 x 9.9 x 7.2 cm multiloculated abscess is noted in the perineal region which surrounds the anus and extends anteriorly and superiorly and involves the base of the penis. This is concerning for developing Fournier's gangrene. Critical Value/emergent results were called by telephone at the time of interpretation on 06/13/2017 at 12:27 pm to Dr. Sharman Cheek , who verbally acknowledged these results. Mild bilateral hydroureteronephrosis is noted secondary to urinary bladder distention, which may be due to bladder outlet obstruction secondary to the previously described abscess. Electronically Signed   By: Lupita Raider, M.D.   On: 06/13/2017 12:27    ____________________________________________   PROCEDURES Procedures  ____________________________________________  DIFFERENTIAL DIAGNOSIS   gluteal cellulitis and abscess, perirectal abscess, bowel perforation  CLINICAL IMPRESSION / ASSESSMENT AND PLAN / ED COURSE  Pertinent labs & imaging results that were available during my care of the patient were reviewed by me and considered in my medical decision making (see chart for details).      Clinical Course as of Jun 13 1520  Thu Jun 13, 2017   1048 Peri-anal abscess on exam. High wbc, low NA. Will get CT a/p to eval for peri-rectal abscess. IVF and zosyn IV now for infection.    [PS]  1235 CT discussed with radiology, large abscess, concerning for Fournier's gangrene. However, no gas in CT, does not appear to be a necrotizing infection. Case d/w surgery Dr. Earlene Plater who will eval in ED.    [PS]  1519 surgery reports several 100 cc of pus was drained and patient is improved and suitable for discharge home. Continue Bactrim, I'll add Keflex as well, follow up with surgery clinic.vital signs remain normal, patient is not septic.   [PS]  Clinical Course User Index [PS] Sharman Cheek, MD     ----------------------------------------- 2:10 PM on 06/13/2017 -----------------------------------------  D/w surgery, plans to I&D at bedside and assess symptom improvement to determine dispo.  ____________________________________________   FINAL CLINICAL IMPRESSION(S) / ED DIAGNOSES    Final diagnoses:  Perirectal abscess  Abscess and cellulitis of gluteal region  Hyponatremia     ED Discharge Orders    None      Portions of this note were generated with dragon dictation software. Dictation errors may occur despite best attempts at proofreading.    Sharman Cheek, MD 06/13/17 [phone]

## 2017-06-14 ENCOUNTER — Telehealth: Payer: Self-pay

## 2017-06-14 ENCOUNTER — Encounter: Payer: Self-pay | Admitting: Primary Care

## 2017-06-14 NOTE — Telephone Encounter (Signed)
[facility] 06/13/17; [facility].Please advise.

## 2017-06-14 NOTE — Telephone Encounter (Signed)
Copied from CRM [id number]. Topic: General - Other >> Jun 14, 2017  8:40 AM Oneal Grout wrote: Reason for CRM: Needing work note for patient to be out till 06/19/17, patient seen on 06/13/17.

## 2017-06-14 NOTE — Telephone Encounter (Signed)
Please notify patient that I reviewed his visit from [facility] and see that his abscess was drained. I'd like to see him back in the office next Tuesday before returning back to work, please schedule. Work note is ready for pick up.

## 2017-06-14 NOTE — Telephone Encounter (Addendum)
Per DPR, left detail message of Graylon Gunning comments for patient to call back.  Left work note in the front office for pickup.

## 2017-06-17 ENCOUNTER — Encounter: Payer: Self-pay | Admitting: Primary Care

## 2017-06-17 DIAGNOSIS — B019 Varicella without complication: Secondary | ICD-10-CM | POA: Insufficient documentation

## 2017-06-17 HISTORY — DX: Varicella without complication: B01.9

## 2017-06-17 NOTE — Telephone Encounter (Signed)
Per DPR, left detail message of Kate Clark's comments for patient to call back 

## 2017-06-17 NOTE — Telephone Encounter (Signed)
Faxed the letter to [phone]

## 2017-06-17 NOTE — Telephone Encounter (Signed)
Wife called requesting that letter to be faxed to [phone]. If unable to fax please contact Addison Baileyina Moccio at ph# [phone].

## 2017-06-18 ENCOUNTER — Encounter: Payer: Self-pay | Admitting: Primary Care

## 2017-06-18 NOTE — Telephone Encounter (Signed)
Zella BallRobin, can you fax his work note? It should be up front in the file folder under his last name? Also FYI on his FMLA paperwork, it should be coming soon.

## 2017-06-18 NOTE — Telephone Encounter (Signed)
Delmer Islamobin, FYI. He Is needing FMLA date extension with a return date of 06/24/17.

## 2017-06-21 ENCOUNTER — Encounter: Payer: Self-pay | Admitting: Surgery

## 2017-06-21 ENCOUNTER — Ambulatory Visit (INDEPENDENT_AMBULATORY_CARE_PROVIDER_SITE_OTHER): Payer: BLUE CROSS/BLUE SHIELD | Admitting: Surgery

## 2017-06-21 VITALS — BP 175/95 | HR 90 | Temp 98.4°F | Ht 73.0 in | Wt 287.6 lb

## 2017-06-21 DIAGNOSIS — K612 Anorectal abscess: Secondary | ICD-10-CM

## 2017-06-21 DIAGNOSIS — L732 Hidradenitis suppurativa: Secondary | ICD-10-CM | POA: Insufficient documentation

## 2017-06-21 NOTE — Patient Instructions (Signed)
Please finish all Antibiotics.   Please see your follow up appointment listed below.

## 2017-06-21 NOTE — Progress Notes (Signed)
Surgical Clinic Progress/Follow-up Note   HPI:  42 y.o. Male presents to clinic for post-op follow-up 1 week s/p bedside excision and drainage of a very large, but non-loculated, peri-anorectal abscess [name]([name], 06/13/2017). Patient reports he's been feeling much better with much improved peri-anal and buttock pain and a small amount of continued drainage from his wound, for which he's been applying a dry gauze to protect his clothing. Patient otherwise has been tolerating regular diet with +flatus and normal BM's, denies N/V, fever/chills, CP, or SOB.  Review of Systems:  Constitutional: denies fever/chills  Respiratory: denies shortness of breath, wheezing  Cardiovascular: denies chest pain, palpitations  Gastrointestinal: denies abdominal pain or N/V with bowel function as per interval history  Vital Signs:  BP (!) 175/95   Pulse 90   Temp 98.4 F (36.9 C) (Oral)   Ht 6\' 1"  (1.854 m)   Wt 287 lb 9.6 oz (130.5 kg)   BMI 37.94 kg/m    Physical Exam:  Constitutional:  -- Normal body habitus  -- Awake, alert, and oriented x3  Pulmonary:  -- No crackles -- Equal breath sounds bilaterally -- Breathing non-labored at rest Cardiovascular:  -- S1, S2 present  -- No pericardial rubs  Gastrointestinal:  -- Soft and non-distended, non-tender, no guarding/rebound tenderness -- No abdominal masses appreciated, pulsatile or otherwise  Anorectal: -- minimally tender peri-anal excision and drainage site remains opened with a small amount of non-purulent serosanguinous drainage and no surrounding erythema Skin: Denies any other rashes or skin discolorations except post-surgical wounds Musculoskeletal / Integumentary:  -- Wounds or skin discoloration: None appreciated except post-surgical incisions as described above (Anorectal) -- Extremities: B/L UE and LE FROM, hands and feet warm, no edema   Assessment:  42 y.o. yo Male with a problem list including...  Patient Active Problem List   Diagnosis Date Noted  . Anorectal abscess   . Chickenpox 06/17/2017  . GAD (generalized anxiety disorder) 05/09/2017  . Essential hypertension 04/25/2017  . Hypertriglyceridemia 04/25/2017    presents to clinic for post-op follow-up evaluation, doing well 1 week s/p bedside excision and drainage of a very large, but non-loculated, peri-anorectal abscess [name]([name], 06/13/2017).  Plan:              - maintain hydration and continue high-fiber diet              - sitz baths up to TID discussed and encouraged for relief  - complete prescribed course of antibiotics even if feeling better/well             - return to clinic in 1 week for one additional follow-up after completion of antibiotics  - instructed to call office if any questions or concerns  All of the above recommendations were discussed with the patient and patient's wife, and all of patient's and family's questions were answered to their expressed satisfaction.  -- Scherrie GerlachJason E. Cody Plateravis, MD, RPVI [place]: [facility] General Surgery - Partnering for exceptional care. Office: [phone]

## 2017-06-26 ENCOUNTER — Encounter: Payer: Self-pay | Admitting: Surgery

## 2017-06-26 ENCOUNTER — Ambulatory Visit (INDEPENDENT_AMBULATORY_CARE_PROVIDER_SITE_OTHER): Payer: BLUE CROSS/BLUE SHIELD | Admitting: Surgery

## 2017-06-26 VITALS — BP 163/101 | HR 99 | Ht 72.0 in | Wt 293.0 lb

## 2017-06-26 DIAGNOSIS — K612 Anorectal abscess: Secondary | ICD-10-CM

## 2017-06-26 MED ORDER — DOXYCYCLINE HYCLATE 100 MG PO TABS: 100.0000 mg | ORAL_TABLET | Freq: Two times a day (BID) | ORAL | 0 refills | Status: AC

## 2017-06-26 NOTE — Patient Instructions (Addendum)
Return in one week.  Rx sent to pharmacy. The patient is aware to call back for any questions or concerns.

## 2017-06-26 NOTE — Progress Notes (Signed)
Surgical Clinic Progress/Follow-up Note   HPI:  42 y.o. Male presents to clinic for subsequent post-op follow-up 2 weeks s/p bedside incision and drainage of a very large simple non-loculated peri-anorectal abscess Earlene Plater(Barrett Holthaus, 06/13/2017). Patient reports minimal residual pain with "tapering" purulent drainage, for which he's continued to apply dry gauze. Patient otherwise states he completed his 10-day prescribed course of antibiotics 2 days ago, continues to report tolerating regular diet with +flatus and normal BM's, denies N/V, fever/chills, CP, or SOB.  Review of Systems:  Constitutional: denies fever/chills  Respiratory: denies shortness of breath, wheezing  Cardiovascular: denies chest pain, palpitations  Gastrointestinal: abdominal and peri-anorectal/buttock pain, N/V, and bowel function as per interval history Skin: Denies any other rashes or skin discolorations except post-surgical wounds as per interval history  Vital Signs:  BP (!) 163/101   Pulse 99   Ht 6' (1.829 m)   Wt 293 lb (132.9 kg)   BMI 39.74 kg/m    Physical Exam:  Constitutional:  -- Obese body habitus  -- Awake, alert, and oriented x3  Pulmonary:  -- No crackles -- Equal breath sounds bilaterally -- Breathing non-labored at rest Cardiovascular:  -- S1, S2 present  -- No pericardial rubs  Gastrointestinal:  -- Soft and non-distended, non-tender to palpation, no guarding/rebound tenderness -- No abdominal masses appreciated, pulsatile or otherwise  Anorectal: -- minimally tender peri-anal excision and drainage site remains opened with a small amount of creamy drainage and no surrounding erythema Musculoskeletal / Integumentary:  -- Wounds or skin discoloration: None appreciated except post-surgical incisions as described above (Anorectal) -- Extremities: B/L UE and LE FROM, hands and feet warm, no edema   Assessment:  [age] y.o. yo Male with a problem list including...  Patient Active Problem List   Diagnosis Date Noted  . Anorectal abscess   . Chickenpox 06/17/2017  . GAD (generalized anxiety disorder) 05/09/2017  . Essential hypertension 04/25/2017  . Hypertriglyceridemia 04/25/2017    presents to clinic for subsequent post-op follow-up evaluation, doing well 2 weeks s/p bedside excision and drainage of a very large, but non-loculated, peri-anorectal abscess Earlene Plater(Dorthey Depace, 06/13/2017).  Plan:   - will extend antibiotics with doxycycline x 7 days  - continue current wound management with gauze to affected area  - return to clinic next week, instructed to call office if any questions or concerns  - continue sitz baths TID or as able as needed for pain relief  - maintain hydration and continue high-fiber diet  All of the above recommendations were discussed with the patient and patient's wife, and all of patient's and family's questions were answered to their expressed satisfaction.  -- Scherrie GerlachJason E. Earlene Plateravis, MD, RPVI [place]: [facility] General Surgery - Partnering for exceptional care. Office: [phone]

## 2017-06-27 ENCOUNTER — Other Ambulatory Visit: Payer: Self-pay | Admitting: Primary Care

## 2017-06-27 DIAGNOSIS — F411 Generalized anxiety disorder: Secondary | ICD-10-CM

## 2017-07-01 ENCOUNTER — Telehealth: Payer: Self-pay | Admitting: Primary Care

## 2017-07-01 NOTE — Telephone Encounter (Signed)
Spouse dropped of [employer] forms to be filled out  In [employer] in box

## 2017-07-01 NOTE — Telephone Encounter (Signed)
Completed and placed on [facility] desk. 

## 2017-07-01 NOTE — Telephone Encounter (Signed)
Spouse is aware paperwork is ready.she is aware pt needs to fill out his part and turn in  Copy for pt Copy for scan

## 2017-07-04 ENCOUNTER — Encounter: Payer: Self-pay | Admitting: Surgery

## 2017-07-04 ENCOUNTER — Ambulatory Visit (INDEPENDENT_AMBULATORY_CARE_PROVIDER_SITE_OTHER): Payer: BLUE CROSS/BLUE SHIELD | Admitting: Surgery

## 2017-07-04 VITALS — BP 171/109 | HR 98 | Temp 98.3°F | Ht 72.0 in | Wt 298.0 lb

## 2017-07-04 DIAGNOSIS — Z09 Encounter for follow-up examination after completed treatment for conditions other than malignant neoplasm: Secondary | ICD-10-CM

## 2017-07-04 NOTE — Patient Instructions (Signed)
Please continue sitz baths three times daily.  Please see your follow up appointment listed below.

## 2017-07-04 NOTE — Progress Notes (Signed)
07/04/2017  HPI: Patient is status post incision and drainage of a perianal abscess on 5/30 by Dr. Earlene Plateravis.  He presents today for further checkup.  Patient reports that he still having drainage from the wound particularly when he sits on a hard surface.  He does report however that the amount of tenderness and swelling has continued to improve.  He just finished his last course of antibiotic yesterday.  Denies any fevers, chills, worsening pain.  Reports having loose stools and some diarrhea with the antibiotics.  Vital signs: BP (!) 171/109   Pulse 98   Temp 98.3 F (36.8 C) (Oral)   Ht 6' (1.829 m)   Wt 298 lb (135.2 kg)   BMI 40.42 kg/m    Physical Exam: Constitutional: No acute distress Rectal: Patient has a right perianal incision measuring about 2 cm in size.  There is good granulation tissue at the bed of the wound.  Probing with Q-tip only shows a small cavity.  There is an area that still has some mild induration lateral to the incision site about 3 cm away from it.  However when doing manual pressure in that area, there is no further pus elucidated.  There is some mild serous to seropurulent fluid on manual pressure of the entire buttocks area but otherwise no specific focal area of another abscess cavity.  There is some erythema in the medial buttocks on both sides, likely from excoriation due to fluid rather than actual infection.  Wound dressed with clean gauze again.  Assessment/Plan: 42 year old male status post incision and drainage of perianal abscess on 5/30.  Patient is overall healing appropriately although there is still a little bit of an area of induration.  At this point however I do not think he needs any further antibiotics given that he has been on antibiotics for almost 3 weeks to a month.  The patient has not been doing sitz baths at home so we did stress the importance of doing sitz bath at least twice a day if not 3 times a day in order to allow for better drainage  and healing of the area.  The patient has been having a hard time doing that because this sits bath bucket is very small.  In light of this, discussed with the patient that he may do this in his bathtub instead.  He will follow-up next week for another wound check.   Howie IllJose Luis Latanga Nedrow, MD [facility]

## 2017-07-05 ENCOUNTER — Ambulatory Visit: Payer: BLUE CROSS/BLUE SHIELD | Admitting: Primary Care

## 2017-07-05 DIAGNOSIS — Z0289 Encounter for other administrative examinations: Secondary | ICD-10-CM

## 2017-07-09 ENCOUNTER — Encounter: Payer: Self-pay | Admitting: Primary Care

## 2017-07-10 ENCOUNTER — Encounter: Payer: Self-pay | Admitting: Primary Care

## 2017-07-10 ENCOUNTER — Telehealth: Payer: Self-pay | Admitting: Primary Care

## 2017-07-10 NOTE — Telephone Encounter (Signed)
Completed and handed to Robin. °

## 2017-07-10 NOTE — Telephone Encounter (Signed)
Spouse dropped off paperwork  She stated pt first day out of work was 06/04/17  He returned to work 6/10  Paperwork in [employer] in box

## 2017-07-11 ENCOUNTER — Encounter: Payer: Self-pay | Admitting: Surgery

## 2017-07-11 ENCOUNTER — Ambulatory Visit (INDEPENDENT_AMBULATORY_CARE_PROVIDER_SITE_OTHER): Payer: BLUE CROSS/BLUE SHIELD | Admitting: Surgery

## 2017-07-11 VITALS — BP 201/121 | HR 103 | Temp 98.7°F | Wt 297.0 lb

## 2017-07-11 DIAGNOSIS — K612 Anorectal abscess: Secondary | ICD-10-CM

## 2017-07-11 NOTE — Progress Notes (Signed)
Outpatient Surgical Follow Up  07/11/2017  [name] is an 42 y.o. male.   CC patient describes continued drainage.  HPI: This patient underwent an I&D at the bedside in the emergency room on May 30 of a perirectal abscess.  He has been seen twice by Dr. Earlene Plater and once by Dr. Aleen Campi in the office where he continues to have drainage.  He states that the drainage now is more bloody but no blood on the stool from the rectum.  Denies fevers or chills.  Past Medical History:  Diagnosis Date  . Anorectal abscess   . Chickenpox 06/17/2017  . Essential hypertension 04/25/2017  . Hyperlipidemia   . Hypertension   . Hypertriglyceridemia 04/25/2017    No past surgical history on file.  Family History  Problem Relation Age of Onset  . Arthritis Mother   . Diabetes Mother   . Heart disease Mother   . Hyperlipidemia Mother   . Hypertension Mother   . Cancer Father        lung  . COPD Father   . Arthritis Sister   . Arthritis Maternal Grandmother   . Diabetes Maternal Grandmother   . Heart disease Maternal Grandmother   . Hyperlipidemia Maternal Grandmother   . Hypertension Maternal Grandmother   . Arthritis Maternal Grandfather   . Heart attack Maternal Grandfather   . Heart disease Maternal Grandfather   . Hyperlipidemia Maternal Grandfather   . Hypertension Maternal Grandfather   . Arthritis Paternal Grandmother   . Melanoma Paternal Grandmother   . Hyperlipidemia Paternal Grandmother   . Hypertension Paternal Grandmother   . Arthritis Paternal Grandfather   . Diabetes Paternal Grandfather   . Heart attack Paternal Grandfather   . Hyperlipidemia Paternal Grandfather   . Hypertension Paternal Grandfather     Social History:  reports that he has never smoked. He has never used smokeless tobacco. He reports that he drinks alcohol. He reports that he does not use drugs.  Allergies: No Known Allergies  Medications reviewed.   Review of Systems:   Review of  Systems  Constitutional: Negative.   HENT: Negative.   Eyes: Negative.   Respiratory: Negative.   Cardiovascular: Negative.   Gastrointestinal: Negative for blood in stool and melena.  Genitourinary: Negative.   Musculoskeletal: Negative.   Skin: Negative.   Endo/Heme/Allergies: Negative.      Physical Exam:  There were no vitals taken for this visit.  Physical Exam  Constitutional: He appears well-developed and well-nourished. No distress.  Afebrile  HENT:  Head: Normocephalic and atraumatic.  Eyes: Pupils are equal, round, and reactive to light. EOM are normal.  Pulmonary/Chest: Effort normal. No respiratory distress.  Genitourinary:  Genitourinary Comments: Left buttock wound draining serous fluid with slight blood tinge no purulence no induration soft and nontender no sign of recurrent abscess.  Skin: He is not diaphoretic.  Vitals reviewed.     No results found for this or any previous visit (from the past 48 hour(s)). No results found.  Assessment/Plan:  Patient seems frustrated over the lack of progress but he is draining only serous fluid and the incision is large enough that it is adequately drained.  I see no sign of induration or recurrent infection.  I would not start him on antibiotics at this point although that was discussed I think that the risks associated with indiscriminate antibiotic use outweighs any kind of benefit in this noninfected wound.  And that since I agree very much with Dr. Adelene Idler  assessment from last visit. Discussed with him the pathophysiology of this I see no sign of a fistula formation at this time.  Have him come back and see Dr. Earlene Plateravis at the next available meeting time.  Lattie Hawichard E Massai Hankerson, MD, FACS

## 2017-07-11 NOTE — Patient Instructions (Signed)
Please apply A&D cream on the left buttock to prevent more skin irritation.  We will see you back to check on your progress.

## 2017-07-23 ENCOUNTER — Encounter: Payer: Self-pay | Admitting: Surgery

## 2017-07-23 ENCOUNTER — Ambulatory Visit (INDEPENDENT_AMBULATORY_CARE_PROVIDER_SITE_OTHER): Payer: BLUE CROSS/BLUE SHIELD | Admitting: Surgery

## 2017-07-23 VITALS — BP 184/129 | HR 99 | Temp 98.1°F | Ht 72.0 in | Wt 298.4 lb

## 2017-07-23 DIAGNOSIS — K612 Anorectal abscess: Secondary | ICD-10-CM

## 2017-07-23 MED ORDER — AMOXICILLIN-POT CLAVULANATE 875-125 MG PO TABS: 1.0000 | ORAL_TABLET | Freq: Two times a day (BID) | ORAL | 0 refills | Status: AC

## 2017-07-23 MED ORDER — OXYCODONE HCL 5 MG PO TABS: 5.0000 mg | ORAL_TABLET | Freq: Four times a day (QID) | ORAL | 0 refills | Status: DC | PRN

## 2017-07-23 NOTE — Progress Notes (Signed)
07/23/2017  HPI: Patient is s/p I&D of perianal abscess by Dr. Earlene Plateravis on 5/30.  He presents for further follow up.  He was seen on 6/27 by Dr. Excell Seltzerooper and seemed to continue to improve.  However, the patient reports that about a week ago now, he started having more bloody drainage from the wound and now the area that was sore on the right buttocks has become more tender.  He continues to have a large amount of drainage and uses pads which he says get saturated during the day.    Denies any fevers, chills, chest pain, shortness of breath.  Denies any worsening pain with bowel movement.  Vital signs: BP (!) 184/129   Pulse 99   Temp 98.1 F (36.7 C) (Oral)   Ht 6' (1.829 m)   Wt 298 lb 6.4 oz (135.4 kg)   BMI 40.47 kg/m    Physical Exam: Constitutional: No acute distress Rectal:  Perianal wound from prior I&D remains open, with seropurulent fluid draining.  There is an area about 1.5 inches diameter in the medial right buttocks where it was sore and mildly indurated before and now has purple skin discoloration and fluctuance, consistent with abscess.  There is a smaller area measuring 5 mm diameter just right of midline, anterior to the anal verge, which also has purple discoloration.  Assessment/Plan: 42 yo male s/p I&D of perianal abscess.  Based on his exam and symptoms, he has a persistent or worsening abscess which requires new I&D procedure.  Discussed with the patient the risks of bleeding, infection, and injury to surrounding structures and he's willing to proceed.  Procedure:  I&D of perianal abscess  Surgeon:  Henrene DodgeJose Lateia Fraser, MD  Anesthesia:  5 ml of 1% lidocaine with epi  Complications:  None  Specimen: None  The perianal area was prepped with betadine.  5 mL of 1% lidocaine with epi was infused intradermally at the area of fluctuance.  A cruciate incision was made using #11 scalpel.  This had some bloody purulent fluid expressed.  Forceps was used to go into the cavity and  break up any loculations.  The cavity extended to the prior I&D site, overall with a 5 cm size diameter under the skin.  The cavity was irrigated with saline and packed with 1/2 inch nugauze.  The wound was cleaned and dressed with gauze.  There were no complications and all sharps were appropriately disposed of.  The patient will follow up in two days for wound check and will be given a prescription for Oxycodone for pain control as well as Augmentin to treat the new abscess.   Howie IllJose Luis Sriyan Cutting, MD [facility]

## 2017-07-23 NOTE — Patient Instructions (Signed)
Please pick up your medication at the pharmacy. If the packing material comes out do not put it back.  Please see your follow up appointment listed below.

## 2017-07-25 ENCOUNTER — Ambulatory Visit (INDEPENDENT_AMBULATORY_CARE_PROVIDER_SITE_OTHER): Payer: BLUE CROSS/BLUE SHIELD | Admitting: Surgery

## 2017-07-25 ENCOUNTER — Encounter: Payer: Self-pay | Admitting: Surgery

## 2017-07-25 ENCOUNTER — Encounter: Payer: Self-pay | Admitting: Primary Care

## 2017-07-25 ENCOUNTER — Ambulatory Visit: Payer: BLUE CROSS/BLUE SHIELD | Admitting: Primary Care

## 2017-07-25 VITALS — BP 180/120 | HR 81 | Temp 97.3°F | Wt 299.0 lb

## 2017-07-25 VITALS — BP 190/100 | HR 98 | Temp 98.5°F | Ht 72.0 in | Wt 304.5 lb

## 2017-07-25 DIAGNOSIS — Z09 Encounter for follow-up examination after completed treatment for conditions other than malignant neoplasm: Secondary | ICD-10-CM

## 2017-07-25 DIAGNOSIS — I10 Essential (primary) hypertension: Secondary | ICD-10-CM

## 2017-07-25 MED ORDER — LOSARTAN POTASSIUM 50 MG PO TABS: ORAL_TABLET | ORAL | 0 refills | Status: DC

## 2017-07-25 NOTE — Assessment & Plan Note (Signed)
Continued elevated readings despite treatment of anxiety. Also recovering from peri-rectal abscess which could be contributing, but given persisted elevated readings we will need to treat.  Rx for Losartan 50 mg tablets sent to pharmacy. Will have him continue to monitor BP and follow up in 2-3 weeks for re-check and BMP.

## 2017-07-25 NOTE — Patient Instructions (Signed)
Start losartan 50 mg tablets for high blood pressure. Take 1 tablet by mouth once daily.  Continue monitoring your blood pressure daily, around the same time of day, for the next several weeks.  Ensure that you have rested for 30 minutes prior to checking your blood pressure. Record your readings and bring them to your next visit.  Schedule a follow up visit in 2-3 weeks for blood pressure check.  It was a pleasure to see you today!   DASH Eating Plan DASH stands for "Dietary Approaches to Stop Hypertension." The DASH eating plan is a healthy eating plan that has been shown to reduce high blood pressure (hypertension). It may also reduce your risk for type 2 diabetes, heart disease, and stroke. The DASH eating plan may also help with weight loss. What are tips for following this plan? General guidelines  Avoid eating more than 2,300 mg (milligrams) of salt (sodium) a day. If you have hypertension, you may need to reduce your sodium intake to 1,500 mg a day.  Limit alcohol intake to no more than 1 drink a day for nonpregnant women and 2 drinks a day for men. One drink equals 12 oz of beer, 5 oz of wine, or 1 oz of hard liquor.  Work with your health care provider to maintain a healthy body weight or to lose weight. Ask what an ideal weight is for you.  Get at least 30 minutes of exercise that causes your heart to beat faster (aerobic exercise) most days of the week. Activities may include walking, swimming, or biking.  Work with your health care provider or diet and nutrition specialist (dietitian) to adjust your eating plan to your individual calorie needs. Reading food labels  Check food labels for the amount of sodium per serving. Choose foods with less than 5 percent of the Daily Value of sodium. Generally, foods with less than 300 mg of sodium per serving fit into this eating plan.  To find whole grains, look for the word "whole" as the first word in the ingredient  list. Shopping  Buy products labeled as "low-sodium" or "no salt added."  Buy fresh foods. Avoid canned foods and premade or frozen meals. Cooking  Avoid adding salt when cooking. Use salt-free seasonings or herbs instead of table salt or sea salt. Check with your health care provider or pharmacist before using salt substitutes.  Do not fry foods. Cook foods using healthy methods such as baking, boiling, grilling, and broiling instead.  Cook with heart-healthy oils, such as olive, canola, soybean, or sunflower oil. Meal planning   Eat a balanced diet that includes: ? 5 or more servings of fruits and vegetables each day. At each meal, try to fill half of your plate with fruits and vegetables. ? Up to 6-8 servings of whole grains each day. ? Less than 6 oz of lean meat, poultry, or fish each day. A 3-oz serving of meat is about the same size as a deck of cards. One egg equals 1 oz. ? 2 servings of low-fat dairy each day. ? A serving of nuts, seeds, or beans 5 times each week. ? Heart-healthy fats. Healthy fats called Omega-3 fatty acids are found in foods such as flaxseeds and coldwater fish, like sardines, salmon, and mackerel.  Limit how much you eat of the following: ? Canned or prepackaged foods. ? Food that is high in trans fat, such as fried foods. ? Food that is high in saturated fat, such as fatty meat. ?  Sweets, desserts, sugary drinks, and other foods with added sugar. ? Full-fat dairy products.  Do not salt foods before eating.  Try to eat at least 2 vegetarian meals each week.  Eat more home-cooked food and less restaurant, buffet, and fast food.  When eating at a restaurant, ask that your food be prepared with less salt or no salt, if possible. What foods are recommended? The items listed may not be a complete list. Talk with your dietitian about what dietary choices are best for you. Grains Whole-grain or whole-wheat bread. Whole-grain or whole-wheat pasta. Brown  rice. [place]. Bulgur. Whole-grain and low-sodium cereals. Pita bread. Low-fat, low-sodium crackers. Whole-wheat flour tortillas. Vegetables Fresh or frozen vegetables (raw, steamed, roasted, or grilled). Low-sodium or reduced-sodium tomato and vegetable juice. Low-sodium or reduced-sodium tomato sauce and tomato paste. Low-sodium or reduced-sodium canned vegetables. Fruits All fresh, dried, or frozen fruit. Canned fruit in natural juice (without added sugar). Meat and other protein foods Skinless chicken or [place]. Ground chicken or [place]. Pork with fat trimmed off. Fish and seafood. Egg whites. Dried beans, peas, or lentils. Unsalted nuts, nut butters, and seeds. Unsalted canned beans. Lean cuts of beef with fat trimmed off. Low-sodium, lean deli meat. Dairy Low-fat (1%) or fat-free (skim) milk. Fat-free, low-fat, or reduced-fat cheeses. Nonfat, low-sodium ricotta or cottage cheese. Low-fat or nonfat yogurt. Low-fat, low-sodium cheese. Fats and oils Soft margarine without trans fats. Vegetable oil. Low-fat, reduced-fat, or light mayonnaise and salad dressings (reduced-sodium). Canola, safflower, olive, soybean, and sunflower oils. Avocado. Seasoning and other foods Herbs. Spices. Seasoning mixes without salt. Unsalted popcorn and pretzels. Fat-free sweets. What foods are not recommended? The items listed may not be a complete list. Talk with your dietitian about what dietary choices are best for you. Grains Baked goods made with fat, such as croissants, muffins, or some breads. Dry pasta or rice meal packs. Vegetables Creamed or fried vegetables. Vegetables in a cheese sauce. Regular canned vegetables (not low-sodium or reduced-sodium). Regular canned tomato sauce and paste (not low-sodium or reduced-sodium). Regular tomato and vegetable juice (not low-sodium or reduced-sodium). Angie Fava. Olives. Fruits Canned fruit in a light or heavy syrup. Fried fruit. Fruit in cream or butter  sauce. Meat and other protein foods Fatty cuts of meat. Ribs. Fried meat. Berniece Salines. Sausage. Bologna and other processed lunch meats. Salami. Fatback. Hotdogs. Bratwurst. Salted nuts and seeds. Canned beans with added salt. Canned or smoked fish. Whole eggs or egg yolks. Chicken or [place] with skin. Dairy Whole or 2% milk, cream, and half-and-half. Whole or full-fat cream cheese. Whole-fat or sweetened yogurt. Full-fat cheese. Nondairy creamers. Whipped toppings. Processed cheese and cheese spreads. Fats and oils Butter. Stick margarine. Lard. Shortening. Ghee. Bacon fat. Tropical oils, such as coconut, palm kernel, or palm oil. Seasoning and other foods Salted popcorn and pretzels. Onion salt, garlic salt, seasoned salt, table salt, and sea salt. Worcestershire sauce. Tartar sauce. Barbecue sauce. Teriyaki sauce. Soy sauce, including reduced-sodium. Steak sauce. Canned and packaged gravies. Fish sauce. Oyster sauce. Cocktail sauce. Horseradish that you find on the shelf. Ketchup. Mustard. Meat flavorings and tenderizers. Bouillon cubes. Hot sauce and Tabasco sauce. Premade or packaged marinades. Premade or packaged taco seasonings. Relishes. Regular salad dressings. Where to find more information:  National Heart, Lung, and [place]: https://wilson-eaton.com/  American Heart Association: www.heart.org Summary  The DASH eating plan is a healthy eating plan that has been shown to reduce high blood pressure (hypertension). It may also reduce your risk for type 2 diabetes,  heart disease, and stroke.  With the DASH eating plan, you should limit salt (sodium) intake to 2,300 mg a day. If you have hypertension, you may need to reduce your sodium intake to 1,500 mg a day.  When on the DASH eating plan, aim to eat more fresh fruits and vegetables, whole grains, lean proteins, low-fat dairy, and heart-healthy fats.  Work with your health care provider or diet and nutrition specialist (dietitian) to adjust  your eating plan to your individual calorie needs. This information is not intended to replace advice given to you by your health care provider. Make sure you discuss any questions you have with your health care provider. Document Released: 12/21/2010 Document Revised: 12/26/2015 Document Reviewed: 12/26/2015 Elsevier Interactive Patient Education  [employer].

## 2017-07-25 NOTE — Progress Notes (Signed)
Subjective:    Patient ID: Cody BottomChristopher D Padilla, male    DOB: 01/21/1975, 42 y.o.   MRN: [id number]  HPI  [name] is a 42 year old male who presents today for follow up of hypertension.  He was last evaluated for hypertension in late April 2019. His elevated blood pressure readings were suspected to be secondary to white coat syndrome and anxiety, so his anxiety was treated. Unfortunately, several weeks later he developed cellulitis secondary to a perirectal abscess and has been struggling with recovery from I&D.   BP Readings from Last 3 Encounters:  07/25/17 (!) 190/100  07/23/17 (!) 184/129  07/11/17 (!) 201/121   He's checking his blood pressure at home which are running 140's/70-80's. He denies chest pain, dizziness, headaches. He was once on Amlodipine in the past which caused ankle edema, also on lisinopril in the past which caused decreased libido and cough.   Review of Systems  Eyes: Negative for visual disturbance.  Respiratory: Negative for shortness of breath.   Cardiovascular: Negative for chest pain.  Neurological: Negative for dizziness and headaches.       Past Medical History:  Diagnosis Date  . Anorectal abscess   . Chickenpox 06/17/2017  . Essential hypertension 04/25/2017  . Hyperlipidemia   . Hypertension   . Hypertriglyceridemia 04/25/2017     Social History   Socioeconomic History  . Marital status: Married    Spouse name: Not on file  . Number of children: Not on file  . Years of education: Not on file  . Highest education level: Not on file  Occupational History  . Not on file  Social Needs  . Financial resource strain: Not on file  . Food insecurity:    Worry: Not on file    Inability: Not on file  . Transportation needs:    Medical: Not on file    Non-medical: Not on file  Tobacco Use  . Smoking status: Never Smoker  . Smokeless tobacco: Never Used  Substance and Sexual Activity  . Alcohol use: Yes  . Drug use: Never  .  Sexual activity: Not on file  Lifestyle  . Physical activity:    Days per week: Not on file    Minutes per session: Not on file  . Stress: Not on file  Relationships  . Social connections:    Talks on phone: Not on file    Gets together: Not on file    Attends religious service: Not on file    Active member of club or organization: Not on file    Attends meetings of clubs or organizations: Not on file    Relationship status: Not on file  . Intimate partner violence:    Fear of current or ex partner: Not on file    Emotionally abused: Not on file    Physically abused: Not on file    Forced sexual activity: Not on file  Other Topics Concern  . Not on file  Social History Narrative   Married.   No children.   Works as Medical illustratorengineering technician.    Enjoys playing guitar, lifting weights, walking.    No past surgical history on file.  Family History  Problem Relation Age of Onset  . Arthritis Mother   . Diabetes Mother   . Heart disease Mother   . Hyperlipidemia Mother   . Hypertension Mother   . Cancer Father        lung  . COPD Father   .  Arthritis Sister   . Arthritis Maternal Grandmother   . Diabetes Maternal Grandmother   . Heart disease Maternal Grandmother   . Hyperlipidemia Maternal Grandmother   . Hypertension Maternal Grandmother   . Arthritis Maternal Grandfather   . Heart attack Maternal Grandfather   . Heart disease Maternal Grandfather   . Hyperlipidemia Maternal Grandfather   . Hypertension Maternal Grandfather   . Arthritis Paternal Grandmother   . Melanoma Paternal Grandmother   . Hyperlipidemia Paternal Grandmother   . Hypertension Paternal Grandmother   . Arthritis Paternal Grandfather   . Diabetes Paternal Grandfather   . Heart attack Paternal Grandfather   . Hyperlipidemia Paternal Grandfather   . Hypertension Paternal Grandfather     No Known Allergies  Current Outpatient Medications on File Prior to Visit  Medication Sig Dispense Refill    . amoxicillin-clavulanate (AUGMENTIN) 875-125 MG tablet Take 1 tablet by mouth 2 (two) times daily for 10 days. 20 tablet 0  . APPLE CIDER VINEGAR PO Take 450 mg by mouth daily.    . B Complex-Biotin-FA (B-COMPLEX PO) Take 35 mg by mouth daily.    Marland Kitchen escitalopram (LEXAPRO) 10 MG tablet TAKE 1 TABLET BY MOUTH EVERY DAY 90 tablet 0  . famotidine (PEPCID) 20 MG tablet Take 20 mg by mouth daily.    . fenofibrate 160 MG tablet Take 1 tablet by mouth once daily for high cholesterol. 90 tablet 3  . Misc Natural Products (RELAX & SLEEP PO) Take 325 mg by mouth daily.    Marland Kitchen oxyCODONE (ROXICODONE) 5 MG immediate release tablet Take 1 tablet (5 mg total) by mouth every 6 (six) hours as needed. 20 tablet 0  . UNABLE TO FIND Take 2,000 mg by mouth 2 (two) times daily. Med Name: BEET ROOT     No current facility-administered medications on file prior to visit.     BP (!) 190/100   Pulse 98   Temp 98.5 F (36.9 C) (Oral)   Ht 6' (1.829 m)   Wt (!) 304 lb 8 oz (138.1 kg)   SpO2 95%   BMI 41.30 kg/m    Objective:   Physical Exam  Constitutional: He appears well-nourished.  Neck: Neck supple.  Cardiovascular: Normal rate and regular rhythm.  Respiratory: Effort normal and breath sounds normal.  Skin: Skin is warm and dry.           Assessment & Plan:

## 2017-07-25 NOTE — Patient Instructions (Signed)
Please continue your antibiotics.  Please do sitz baths at least twice a day and/or after each bowel movement.

## 2017-07-25 NOTE — Progress Notes (Signed)
07/25/2017  HPI:  Patient is s/p I&D of perianal abscess by Dr. Earlene Plateravis on 5/30, with repeat I&D on 7/9 by me due to persistent abscess and drainage.  He presents today for follow up.  Patient feels that the amount of drainage has decreased and the pain has somewhat improved.  Denies any worsening pain, fevers, or chills.  He's taking the Augmentin antibiotic as indicated.  Vital signs: BP (!) 180/120   Pulse 81   Temp (!) 97.3 F (36.3 C) (Oral)   Wt 299 lb (135.6 kg)   BMI 40.55 kg/m    Physical Exam: Constitutional: No acute distress Rectal:  New I&D site healing well without any further purulent fluid draining from it or from the prior I&D site.  Both remain open with healthy tissue.  No significant erythema or induration.  Wick of gauze placed on recent I&D had fallen off.  New dry external gauze applied.  Assessment/Plan: 42 yo male s/p repeat I&D of perianal abscess.  --Discussed with patient that he appears to be healing well and there is no noticeable purulent drainage at this time.  He should continue with dry gauze and change as needed. --He should do sitz baths twice a day and/or after bowel movements to keep the area clean and dry. --Continue taking antibiotic and complete course. --Follow up in 1 week to assess his wounds.   Cody IllJose Luis Ceana Fiala, MD [facility]

## 2017-08-02 ENCOUNTER — Encounter: Payer: Self-pay | Admitting: Surgery

## 2017-08-02 ENCOUNTER — Ambulatory Visit (INDEPENDENT_AMBULATORY_CARE_PROVIDER_SITE_OTHER): Payer: BLUE CROSS/BLUE SHIELD | Admitting: Surgery

## 2017-08-02 VITALS — BP 168/113 | HR 96 | Ht 75.0 in | Wt 304.0 lb

## 2017-08-02 DIAGNOSIS — Z09 Encounter for follow-up examination after completed treatment for conditions other than malignant neoplasm: Secondary | ICD-10-CM

## 2017-08-02 DIAGNOSIS — K612 Anorectal abscess: Secondary | ICD-10-CM

## 2017-08-02 NOTE — Patient Instructions (Addendum)
Follow up two weeks . should do sitz baths twice a day and/or after bowel movements to keep the area clean and dry

## 2017-08-02 NOTE — Progress Notes (Signed)
08/02/2017  HPI: Patient is s/p I&D of perianal abscess by Dr. Earlene Plateravis on 5/30, with repeat I&D on 7/9 by me due to persistent abscess and drainage.  He presents for further follow up.  He reports that he finished the antibiotic course and denies any further pain at the I&D sites.  Reports minimal drainage from the wounds.    Vital signs: BP (!) 168/113   Pulse 96   Ht 6\' 3"  (1.905 m)   Wt (!) 304 lb (137.9 kg)   BMI 38.00 kg/m    Physical Exam: Constitutional: No acute distress Rectal:  I&D sites healing well, with less induration and erythema.  The patient does have excoriation of the skin, but on exam it appears that it's related to stool rather than drainage from the wounds.  Only minimal amount of purulent fluid on manual expression.   Assessment/Plan: 42 yo male s/p two I&D for perianal abscess.  --No further antibiotics needed at this time. --Again recommended that he continue with Sitz baths twice a day at least to keep the area clean.  He also needs to do better hygiene to avoid excoriation.  This could have contributed to his abscess worsening in the first place.  Recommended barrier cream if needed to help with the excoriation. --Patient will follow up in two weeks to re-evaluate his progress.     Howie IllJose Luis Masaji Billups, MD [facility]

## 2017-08-09 ENCOUNTER — Encounter: Payer: Self-pay | Admitting: Primary Care

## 2017-08-09 ENCOUNTER — Ambulatory Visit: Payer: BLUE CROSS/BLUE SHIELD | Admitting: Primary Care

## 2017-08-09 VITALS — BP 160/90 | HR 97 | Temp 98.4°F | Ht 75.0 in | Wt 302.2 lb

## 2017-08-09 DIAGNOSIS — I10 Essential (primary) hypertension: Secondary | ICD-10-CM | POA: Diagnosis not present

## 2017-08-09 DIAGNOSIS — K612 Anorectal abscess: Secondary | ICD-10-CM | POA: Diagnosis not present

## 2017-08-09 LAB — BASIC METABOLIC PANEL
BUN: 12 mg/dL (ref 6–23)
CO2: 23 mEq/L (ref 19–32)
Calcium: 9 mg/dL (ref 8.4–10.5)
Chloride: 103 mEq/L (ref 96–112)
Creatinine, Ser: 0.83 mg/dL (ref 0.40–1.50)
GFR: 108.36 mL/min (ref >60.00)
Glucose, Bld: 104 mg/dL — ABNORMAL HIGH (ref 70–99)
POTASSIUM: 4 meq/L (ref 3.5–5.1)
SODIUM: 137 meq/L (ref 135–145)

## 2017-08-09 MED ORDER — LOSARTAN POTASSIUM 100 MG PO TABS: ORAL_TABLET | ORAL | 0 refills | Status: DC

## 2017-08-09 NOTE — Assessment & Plan Note (Signed)
Improved but not yet at goal. Increase Losartan to 100 mg daily. BMP pending today. Will have him monitor BP readings and we will contact him for readings in 2 weeks. If above goal then consider HCTZ.

## 2017-08-09 NOTE — Assessment & Plan Note (Signed)
No obvious infection apparent, however, did not some skin breakdown/ulceration. Will have him closely monitor site, especially given fevers. Discussed if fevers persist to go to ED. He will call surgical office today.

## 2017-08-09 NOTE — Progress Notes (Signed)
Subjective:    Patient ID: [name], male    DOB: 03/19/1975, 42 y.o.   MRN: [id number]  HPI  [name] is a 42 year old male who presents today for follow up of hypertension.  He was last evaluated 2 weeks ago for continued elevated BP readings despite treatment and near recovery from perirectal abscess and also improvement in anxiety. He was initiated on losartan 50 mg and asked to return for follow up.  BP Readings from Last 3 Encounters:  08/09/17 (!) 160/90  08/02/17 (!) 168/113  07/25/17 (!) 180/120   He is checking his BP at home which is running 150-160's/90-100's. He denies chest pain, dizziness, shortness of breath.   He has noticed intermittent fever with chills, also peri-rectal "leaking" from the surgical site of his recent abscess. The drainage is clear. He plans on notifying the surgical center.   Review of Systems  Constitutional: Positive for fever.  Respiratory: Negative for shortness of breath.   Cardiovascular: Negative for chest pain.  Genitourinary:       Leaking to peri-rectal surgical site.  Neurological: Negative for dizziness and headaches.       Past Medical History:  Diagnosis Date  . Anorectal abscess   . Chickenpox 06/17/2017  . Essential hypertension 04/25/2017  . Hyperlipidemia   . Hypertension   . Hypertriglyceridemia 04/25/2017     Social History   Socioeconomic History  . Marital status: Married    Spouse name: Not on file  . Number of children: Not on file  . Years of education: Not on file  . Highest education level: Not on file  Occupational History  . Not on file  Social Needs  . Financial resource strain: Not on file  . Food insecurity:    Worry: Not on file    Inability: Not on file  . Transportation needs:    Medical: Not on file    Non-medical: Not on file  Tobacco Use  . Smoking status: Never Smoker  . Smokeless tobacco: Never Used  Substance and Sexual Activity  . Alcohol use: Yes  . Drug use:  Never  . Sexual activity: Not on file  Lifestyle  . Physical activity:    Days per week: Not on file    Minutes per session: Not on file  . Stress: Not on file  Relationships  . Social connections:    Talks on phone: Not on file    Gets together: Not on file    Attends religious service: Not on file    Active member of club or organization: Not on file    Attends meetings of clubs or organizations: Not on file    Relationship status: Not on file  . Intimate partner violence:    Fear of current or ex partner: Not on file    Emotionally abused: Not on file    Physically abused: Not on file    Forced sexual activity: Not on file  Other Topics Concern  . Not on file  Social History Narrative   Married.   No children.   Works as Medical illustratorengineering technician.    Enjoys playing guitar, lifting weights, walking.    No past surgical history on file.  Family History  Problem Relation Age of Onset  . Arthritis Mother   . Diabetes Mother   . Heart disease Mother   . Hyperlipidemia Mother   . Hypertension Mother   . Cancer Father  lung  . COPD Father   . Arthritis Sister   . Arthritis Maternal Grandmother   . Diabetes Maternal Grandmother   . Heart disease Maternal Grandmother   . Hyperlipidemia Maternal Grandmother   . Hypertension Maternal Grandmother   . Arthritis Maternal Grandfather   . Heart attack Maternal Grandfather   . Heart disease Maternal Grandfather   . Hyperlipidemia Maternal Grandfather   . Hypertension Maternal Grandfather   . Arthritis Paternal Grandmother   . Melanoma Paternal Grandmother   . Hyperlipidemia Paternal Grandmother   . Hypertension Paternal Grandmother   . Arthritis Paternal Grandfather   . Diabetes Paternal Grandfather   . Heart attack Paternal Grandfather   . Hyperlipidemia Paternal Grandfather   . Hypertension Paternal Grandfather     No Known Allergies  Current Outpatient Medications on File Prior to Visit  Medication Sig  Dispense Refill  . APPLE CIDER VINEGAR PO Take 450 mg by mouth daily.    . B Complex-Biotin-FA (B-COMPLEX PO) Take 35 mg by mouth daily.    Marland Kitchen escitalopram (LEXAPRO) 10 MG tablet TAKE 1 TABLET BY MOUTH EVERY DAY 90 tablet 0  . famotidine (PEPCID) 20 MG tablet Take 20 mg by mouth daily.    . fenofibrate 160 MG tablet Take 1 tablet by mouth once daily for high cholesterol. 90 tablet 3  . Misc Natural Products (RELAX & SLEEP PO) Take 325 mg by mouth daily.    Marland Kitchen oxyCODONE (ROXICODONE) 5 MG immediate release tablet Take 1 tablet (5 mg total) by mouth every 6 (six) hours as needed. 20 tablet 0  . UNABLE TO FIND Take 2,000 mg by mouth 2 (two) times daily. Med Name: BEET ROOT     No current facility-administered medications on file prior to visit.     BP (!) 160/90 (BP Location: Right Arm, Patient Position: Sitting, Cuff Size: Large)   Pulse 97   Temp 98.4 F (36.9 C) (Oral)   Ht 6\' 3"  (1.905 m)   Wt (!) 302 lb 4 oz (137.1 kg)   SpO2 98%   BMI 37.78 kg/m    Objective:   Physical Exam  Constitutional: He appears well-nourished.  Neck: Neck supple.  Cardiovascular: Normal rate and regular rhythm.  Respiratory: Effort normal and breath sounds normal.  Skin: Skin is warm.  Mild erythema surrounding post surgical site, evidence of skin breakdown to right inner gluteal fold. No purulent drainage noted.           Assessment & Plan:

## 2017-08-09 NOTE — Patient Instructions (Signed)
We've increased the dose of your losartan to 100 mg. You may take two of the 50 mg tablets until your bottle is gone. I sent a prescription for 100 mg tablets to your pharmacy.  Stop by the lab prior to leaving today. I will notify you of your results once received.   Please notify the surgical office of your fevers. Continue Ibuprofen and monitor the site as discussed.  Continue to monitor your blood pressure daily, I'll call you for readings in 2 weeks.  It was a pleasure to see you today!

## 2017-08-15 ENCOUNTER — Ambulatory Visit: Payer: BLUE CROSS/BLUE SHIELD | Admitting: Surgery

## 2017-08-16 ENCOUNTER — Other Ambulatory Visit: Payer: Self-pay | Admitting: Primary Care

## 2017-08-16 DIAGNOSIS — I10 Essential (primary) hypertension: Secondary | ICD-10-CM

## 2017-08-20 ENCOUNTER — Encounter: Payer: Self-pay | Admitting: Surgery

## 2017-08-20 ENCOUNTER — Ambulatory Visit (INDEPENDENT_AMBULATORY_CARE_PROVIDER_SITE_OTHER): Payer: BLUE CROSS/BLUE SHIELD | Admitting: Surgery

## 2017-08-20 VITALS — BP 167/109 | HR 101 | Temp 97.7°F | Ht 75.0 in | Wt 307.0 lb

## 2017-08-20 DIAGNOSIS — K612 Anorectal abscess: Secondary | ICD-10-CM

## 2017-08-20 NOTE — Progress Notes (Signed)
Surgical Clinic Progress/Follow-up Note   HPI:  42 y.o. Male presents to clinic for follow-up evaluation s/p drainage of a large complex Right > Left peri-anorectal abscess extending to his scrotum and across midline parallel to his rectum [name]([name], 06/13/2017) with subsequent incision and drainage for a Right buttock abscess ([name], 07/23/2017). At his last appointment with Dr. Aleen CampiPiscoya, patient reported having completed his prescribed antibiotics. Patient now describes resolution of drainage over the past 2 weeks, though he and his wife express frustration with the need to be re-assessed and further office visits, requesting instead to follow-up as needed going forward. He otherwise says his Right buttock I&D sites have healed, while the peri-anal wound has been more slow to heal. He otherwise denies pain, fever/chills, and constipation or loose BM's (since completing antibiotics). He has been tolerating regular diet with +flatus and denies N/V, CP, or SOB.  Review of Systems:  Constitutional: denies fever/chills  Respiratory: denies shortness of breath, wheezing  Cardiovascular: denies chest pain, palpitations  Gastrointestinal: abdominal and anorectal pain, N/V, and bowel function as per interval history Skin: Denies any other rashes or skin discolorations except post-surgical wounds as per interval history  Vital Signs:  BP (!) 167/109   Pulse (!) 101   Temp 97.7 F (36.5 C) (Oral)   Ht 6\' 3"  (1.905 m)   Wt (!) 307 lb (139.3 kg)   BMI 38.37 kg/m    Physical Exam:  Constitutional:  -- Obese body habitus  -- Awake, alert, and oriented x3  Pulmonary:  -- No crackles -- Equal breath sounds bilaterally -- Breathing non-labored at rest Cardiovascular:  -- S1, S2 present  -- No pericardial rubs  Gastrointestinal:  -- Soft and non-distended, non-tender, no guarding/rebound tenderness -- Right buttock incision and drainage sites are well-approximated and healed without any surrounding  erythema or drainage, while peri-anal site appears with healthy viable granulation tissue, no surrounding erythema or purulent drainage, and no appreciable fluctuance, though moist mucus-like fluid along anal canal -- No abdominal masses appreciated, pulsatile or otherwise  Musculoskeletal / Integumentary:  -- Wounds or skin discoloration: None appreciated except post-surgical incisions as described above (GI) -- Extremities: B/L UE and LE FROM, hands and feet warm   Assessment:  42 y.o. yo Male with a problem list including...  Patient Active Problem List   Diagnosis Date Noted  . Anorectal abscess   . Chickenpox 06/17/2017  . GAD (generalized anxiety disorder) 05/09/2017  . Essential hypertension 04/25/2017  . Hypertriglyceridemia 04/25/2017    presents to clinic for follow-up evaluation, doing overall well s/p drainage of a large complex Right > Left peri-anorectal abscess extending to his scrotum and across midline parallel to his rectum [name]([name], 06/13/2017) with subsequent incision and drainage for a Right buttock abscess ([name], 07/23/2017) with ongoing wound healing and challenges involving personal hygiene.  Plan:              - maintain hydration with heart-healthy high-fiber diet             - okay to submerge incisions under water (baths, swimming) prn as requested, though importance of keeping peri-anal site as clean and dry as possible again discussed and strongly encouraged, including wipes, gauze/pads, loose shorts, and sitz baths             - return to clinic as needed, also as reqeusted, and instructed to call office if any questions or concerns  All of the above recommendations were discussed with the patient and patient's wife,  and all of patient's and family's questions were answered to their expressed satisfaction.  -- Cody Gerlach Cody Plater, MD, RPVI : [facility] - Partnering for exceptional care. Office: [phone]

## 2017-08-20 NOTE — Patient Instructions (Signed)
Please call our office if you have questions or concerns.   

## 2017-09-02 ENCOUNTER — Ambulatory Visit: Payer: BLUE CROSS/BLUE SHIELD | Admitting: Internal Medicine

## 2017-09-02 ENCOUNTER — Encounter: Payer: Self-pay | Admitting: Internal Medicine

## 2017-09-02 VITALS — BP 154/98 | HR 81 | Temp 98.4°F | Wt 306.0 lb

## 2017-09-02 DIAGNOSIS — K611 Rectal abscess: Secondary | ICD-10-CM | POA: Diagnosis not present

## 2017-09-02 MED ORDER — DOXYCYCLINE HYCLATE 100 MG PO TABS: 100.0000 mg | ORAL_TABLET | Freq: Two times a day (BID) | ORAL | 0 refills | Status: DC

## 2017-09-02 NOTE — Progress Notes (Signed)
Subjective:    Patient ID: Cody BottomChristopher D Talmadge, male    DOB: 03/31/1975, 42 y.o.   MRN: [id number]  HPI  Pt presents to the clinic today with c/o a sore spot on his buttocks. He reports he noticed this 10 days ago. The are is red, swollen and tender. He has noticed some drainage from the area. He denies fever, chills or body aches. He reports abscess/cellulitus of buttocks back in 05/2017. He has tried Septra, Doxycycline and Augmentin. He has been to the ER for the same. He has been seeing generally surgery, Dr. Earlene Plateravis for there same. Note from 08/2017 reviewed.  Review of Systems      Past Medical History:  Diagnosis Date  . Anorectal abscess   . Chickenpox 06/17/2017  . Essential hypertension 04/25/2017  . Hyperlipidemia   . Hypertension   . Hypertriglyceridemia 04/25/2017    Current Outpatient Medications  Medication Sig Dispense Refill  . APPLE CIDER VINEGAR PO Take 450 mg by mouth daily.    . B Complex-Biotin-FA (B-COMPLEX PO) Take 35 mg by mouth daily.    Marland Kitchen. escitalopram (LEXAPRO) 10 MG tablet TAKE 1 TABLET BY MOUTH EVERY DAY 90 tablet 0  . famotidine (PEPCID) 20 MG tablet Take 20 mg by mouth daily.    . fenofibrate 160 MG tablet Take 1 tablet by mouth once daily for high cholesterol. 90 tablet 3  . losartan (COZAAR) 100 MG tablet Take 1 tablet by mouth once daily for blood pressure. 90 tablet 0  . Misc Natural Products (RELAX & SLEEP PO) Take 325 mg by mouth daily.    Marland Kitchen. UNABLE TO FIND Take 2,000 mg by mouth 2 (two) times daily. Med Name: BEET ROOT     No current facility-administered medications for this visit.     No Known Allergies  Family History  Problem Relation Age of Onset  . Arthritis Mother   . Diabetes Mother   . Heart disease Mother   . Hyperlipidemia Mother   . Hypertension Mother   . Cancer Father        lung  . COPD Father   . Arthritis Sister   . Arthritis Maternal Grandmother   . Diabetes Maternal Grandmother   . Heart disease Maternal  Grandmother   . Hyperlipidemia Maternal Grandmother   . Hypertension Maternal Grandmother   . Arthritis Maternal Grandfather   . Heart attack Maternal Grandfather   . Heart disease Maternal Grandfather   . Hyperlipidemia Maternal Grandfather   . Hypertension Maternal Grandfather   . Arthritis Paternal Grandmother   . Melanoma Paternal Grandmother   . Hyperlipidemia Paternal Grandmother   . Hypertension Paternal Grandmother   . Arthritis Paternal Grandfather   . Diabetes Paternal Grandfather   . Heart attack Paternal Grandfather   . Hyperlipidemia Paternal Grandfather   . Hypertension Paternal Grandfather     Social History   Socioeconomic History  . Marital status: Married    Spouse name: Not on file  . Number of children: Not on file  . Years of education: Not on file  . Highest education level: Not on file  Occupational History  . Not on file  Social Needs  . Financial resource strain: Not on file  . Food insecurity:    Worry: Not on file    Inability: Not on file  . Transportation needs:    Medical: Not on file    Non-medical: Not on file  Tobacco Use  . Smoking status: Never Smoker  .  Smokeless tobacco: Never Used  Substance and Sexual Activity  . Alcohol use: Yes  . Drug use: Never  . Sexual activity: Not on file  Lifestyle  . Physical activity:    Days per week: Not on file    Minutes per session: Not on file  . Stress: Not on file  Relationships  . Social connections:    Talks on phone: Not on file    Gets together: Not on file    Attends religious service: Not on file    Active member of club or organization: Not on file    Attends meetings of clubs or organizations: Not on file    Relationship status: Not on file  . Intimate partner violence:    Fear of current or ex partner: Not on file    Emotionally abused: Not on file    Physically abused: Not on file    Forced sexual activity: Not on file  Other Topics Concern  . Not on file  Social  History Narrative   Married.   No children.   Works as Medical illustrator.    Enjoys playing guitar, lifting weights, walking.     Constitutional: Denies fever, malaise, fatigue, headache or abrupt weight changes.  HEENT: Denies eye pain, eye redness, ear pain, ringing in the ears, wax buildup, runny nose, nasal congestion, bloody nose, or sore throat. Respiratory: Denies difficulty breathing, shortness of breath, cough or sputum production.   Cardiovascular: Denies chest pain, chest tightness, palpitations or swelling in the hands or feet.  Gastrointestinal: Denies abdominal pain, bloating, constipation, diarrhea or blood in the stool.  GU: Denies urgency, frequency, pain with urination, burning sensation, blood in urine, odor or discharge. Musculoskeletal: Denies decrease in range of motion, difficulty with gait, muscle pain or joint pain and swelling.  Skin: Denies redness, rashes, lesions or ulcercations.  Neurological: Denies dizziness, difficulty with memory, difficulty with speech or problems with balance and coordination.  Psych: Denies anxiety, depression, SI/HI.  No other specific complaints in a complete review of systems (except as listed in HPI above).  Objective:   Physical Exam   BP (!) 154/98   Pulse 81   Temp 98.4 F (36.9 C) (Oral)   Wt (!) 306 lb (138.8 kg)   SpO2 97%   BMI 38.25 kg/m  Wt Readings from Last 3 Encounters:  09/02/17 (!) 306 lb (138.8 kg)  08/20/17 (!) 307 lb (139.3 kg)  08/09/17 (!) 302 lb 4 oz (137.1 kg)    General: Appears his stated age, obese in NAD. Skin: 0.5 cm linear open abscess drainage pus. No cellulitis noted.   BMET    Component Value Date/Time   NA 137 08/09/2017 0742   K 4.0 08/09/2017 0742   CL 103 08/09/2017 0742   CO2 23 08/09/2017 0742   GLUCOSE 104 (H) 08/09/2017 0742   BUN 12 08/09/2017 0742   CREATININE 0.83 08/09/2017 0742   CALCIUM 9.0 08/09/2017 0742   GFRNONAA >60 06/13/2017 0959   GFRAA >60  06/13/2017 0959    Lipid Panel     Component Value Date/Time   CHOL 233 (H) 04/25/2017 0959   TRIG (H) 04/25/2017 0959    602.0 Triglyceride is over 400; calculations on Lipids are invalid.   HDL 38.20 (L) 04/25/2017 0959   CHOLHDL 6 04/25/2017 0959    CBC    Component Value Date/Time   WBC 23.0 (H) 06/13/2017 0959   RBC 4.68 06/13/2017 0959   HGB 14.8 06/13/2017 0959  HCT 42.4 06/13/2017 0959   PLT 486 (H) 06/13/2017 0959   MCV 90.7 06/13/2017 0959   MCH 31.5 06/13/2017 0959   MCHC 34.8 06/13/2017 0959   RDW 13.6 06/13/2017 0959   LYMPHSABS 1.0 06/13/2017 0959   MONOABS 1.9 (H) 06/13/2017 0959   EOSABS 0.1 06/13/2017 0959   BASOSABS 0.0 06/13/2017 0959    Hgb A1C Lab Results  Component Value Date   HGBA1C 5.0 04/25/2017           Assessment & Plan:   Abscess of Right Buttock:  Ongoing issue Concern for persistent infection Wound cultured today eRx for Doxycycline 100 mg BID- avoid sun exposure Follow up with Dr. Earlene Plateravis if seems to be worsening  Return precautions discussed Nicki Reaperegina Leilyn Frayre, NP

## 2017-09-02 NOTE — Addendum Note (Signed)
Addended by: Roena MaladyEVONTENNO, MELANIE Y on: 09/02/2017 11:07 AM   Modules accepted: Orders

## 2017-09-02 NOTE — Patient Instructions (Signed)
Perianal Abscess  An abscess is an infected area that is filled with pus. A perianal abscess occurs in the perineum, which is the area between the anus and the scrotum in males and between the anus and the vagina in females. Perianal abscesses can vary in size. Without treatment, a perianal abscess can become larger and cause other problems.  What are the causes?  This condition is caused by:  · Waste from damaged or dead tissue (debris) that plugs up glands in the perineum. When this happens, an abscess may form.  · Infections of the perineum.    What are the signs or symptoms?  Common symptoms of this condition include:  · Swelling and redness in the area of the abscess. The redness may go beyond the abscess and appear as a red streak on the skin.  · Pain in the area of the abscess, including pain when sitting, walking, or passing stool.    Other possible symptoms include:  · A visible, painful lump, or a lump that can be felt when touched.  · Bleeding or pus-like discharge from the area.  · Fever.  · General weakness.    How is this diagnosed?  This condition is diagnosed based on your medical history and a physical exam of the affected area.  · This may involve examining the rectal area with a gloved hand (digital rectal exam).  · Sometimes, the health care provider needs to look into the rectum using a probe or a scope.  · For women, it may require a careful vaginal exam.    How is this treated?  Treatment for this condition may include:  · Making a cut (incision) in the abscess to drain the pus. This can sometimes be done in your health care provider's office or an emergency department after you are given medicine to numb the area (local anesthetic).  · Surgery to drain the abscess. This is for larger or deeper abscesses.  · Antibiotic medicines, if there is infection in the surrounding tissue (cellulitis).  · Having gauze packed into the abscess to continue draining the area.  · Frequent baths in warm water  that is deep enough to cover your hips and buttocks (sitz baths). These help the wound heal and they make the abscess less likely to come back.    Follow these instructions at home:  Medicines  · Take over-the-counter and prescription medicines for pain, fever, or discomfort only as told by your health care provider.  · If you were prescribed an antibiotic medicine, use it as told by your health care provider. Do not stop using the antibiotic even if you start to feel better.  · Do not drive or use heavy machinery while taking prescription pain medicine.  Wound care    · Keep the skin around the wound clean and dry. Avoid cleaning the area too much.  · Avoid scratching the wound.  · Avoid using colored or perfumed toilet papers.  · Take a sitz bath 3-4 times a day and after bowel movements. This will help reduce pain and swelling.  · If directed, apply ice to the injured area:  ? Put ice in a plastic bag.  ? Place a towel between your skin and the bag.  ? Leave the ice on for 20 minutes, 2-3 times a day.  · Check your incision area every day for signs of infection. Check for:  ? More redness, swelling, or pain.  ? More fluid or blood.  ?   Warmth.  ? Pus or a bad smell.  Gauze  · If gauze was used in the abscess, follow instructions from your health care provider about removing or changing the gauze. It can usually be removed in 2-3 days.  · Wash your hands with soap and water before you remove or change your gauze. If soap and water are not available, use hand sanitizer.  · If one or more drains were placed in the abscess cavity, be careful not to pull at them. Your health care provider will tell you how long they need to remain in place.  General instructions  · Keep all follow-up visits as told by your health care provider. This is important.  Contact a health care provider if:  · You have trouble passing stool or passing urine.  · Your pain or swelling in the affected area does not seem to be getting  better.  · The gauze packing or the drains come out before the planned time.  Get help right away if:  · You have problems moving or using your legs.  · You have severe or increasing pain.  · Your swelling in the affected area suddenly gets worse.  · You have a large increase in bleeding or passing of pus.  · You have chills or a fever.  This information is not intended to replace advice given to you by your health care provider. Make sure you discuss any questions you have with your health care provider.  Document Released: 02/07/2006 Document Revised: 07/22/2015 Document Reviewed: 06/13/2015  Elsevier Interactive Patient Education © 2018 [employer] Inc[employer]

## 2017-09-06 ENCOUNTER — Encounter: Payer: Self-pay | Admitting: Internal Medicine

## 2017-09-06 ENCOUNTER — Encounter: Payer: Self-pay | Admitting: Surgery

## 2017-09-06 LAB — WOUND CULTURE
MICRO NUMBER: 90984276
SPECIMEN QUALITY:: ADEQUATE

## 2017-09-09 ENCOUNTER — Telehealth: Payer: Self-pay | Admitting: *Deleted

## 2017-09-09 NOTE — Telephone Encounter (Signed)
Ok for work note? 

## 2017-09-09 NOTE — Telephone Encounter (Signed)
Copied from CRM [phone]. Topic: General - Other >> Sep 09, 2017  7:38 AM Gerrianne ScalePayne, Angela L wrote: Reason for CRM: pt requesting to extend work note until Friday since [place] didn't want him to be exposed to heat and sun due to antibiotic please call [phone]

## 2017-09-09 NOTE — Telephone Encounter (Signed)
Work note left in front office.... Called pt but unable to lmovm.Marland Kitchen..Marland Kitchen

## 2017-09-12 ENCOUNTER — Telehealth: Payer: Self-pay | Admitting: Primary Care

## 2017-09-12 NOTE — Telephone Encounter (Signed)
Spouse dropped off short term disability form to be filled out  In [name]'s in box

## 2017-09-13 DIAGNOSIS — Z0279 Encounter for issue of other medical certificate: Secondary | ICD-10-CM

## 2017-09-13 NOTE — Telephone Encounter (Signed)
Done, will give back to Zella BallRobin as soon as I return to the office.

## 2017-09-13 NOTE — Telephone Encounter (Signed)
Spouse aware paperwork is ready for pick up Copy for pt copyfor scan Copy for billing

## 2017-09-18 ENCOUNTER — Encounter: Payer: Self-pay | Admitting: Primary Care

## 2017-10-01 ENCOUNTER — Other Ambulatory Visit: Payer: Self-pay | Admitting: Primary Care

## 2017-10-01 DIAGNOSIS — F411 Generalized anxiety disorder: Secondary | ICD-10-CM

## 2017-11-06 ENCOUNTER — Other Ambulatory Visit: Payer: Self-pay | Admitting: Primary Care

## 2017-11-06 DIAGNOSIS — I10 Essential (primary) hypertension: Secondary | ICD-10-CM

## 2017-11-12 NOTE — Telephone Encounter (Signed)
[name], will you schedule him for Friday at 9 am? Looks like it's the last slot for the week. If that doesn't work for him then get him in with one of our other doctors.  I'm full this week.

## 2017-11-13 ENCOUNTER — Telehealth: Payer: Self-pay | Admitting: Primary Care

## 2017-11-13 NOTE — Telephone Encounter (Signed)
Lvm for pt and pt's wife asking for a call back. Pt needs to be scheduled to come in this week per Jae Dire (per [employer]).

## 2017-11-15 ENCOUNTER — Encounter: Payer: Self-pay | Admitting: Primary Care

## 2017-11-15 ENCOUNTER — Ambulatory Visit: Payer: BLUE CROSS/BLUE SHIELD | Admitting: Primary Care

## 2017-11-15 VITALS — BP 136/80 | HR 94 | Temp 98.2°F | Ht 75.0 in | Wt 324.8 lb

## 2017-11-15 DIAGNOSIS — K611 Rectal abscess: Secondary | ICD-10-CM | POA: Diagnosis not present

## 2017-11-15 LAB — CBC WITH DIFFERENTIAL/PLATELET
BASOS PCT: 0.6 % (ref 0.0–3.0)
Basophils Absolute: 0 10*3/uL (ref 0.0–0.1)
EOS ABS: 0.1 10*3/uL (ref 0.0–0.7)
EOS PCT: 1.4 % (ref 0.0–5.0)
HEMATOCRIT: 42.5 % (ref 39.0–52.0)
Hemoglobin: 14.7 g/dL (ref 13.0–17.0)
LYMPHS PCT: 27.2 % (ref 12.0–46.0)
Lymphs Abs: 1.9 10*3/uL (ref 0.7–4.0)
MCHC: 34.6 g/dL (ref 30.0–36.0)
MCV: 91.6 fl (ref 78.0–100.0)
MONOS PCT: 10 % (ref 3.0–12.0)
Monocytes Absolute: 0.7 10*3/uL (ref 0.1–1.0)
NEUTROS ABS: 4.3 10*3/uL (ref 1.4–7.7)
Neutrophils Relative %: 60.8 % (ref 43.0–77.0)
PLATELETS: 275 10*3/uL (ref 150.0–400.0)
RBC: 4.65 Mil/uL (ref 4.22–5.81)
RDW: 14 % (ref 11.5–15.5)
WBC: 7.1 10*3/uL (ref 4.0–10.5)

## 2017-11-15 LAB — HEMOGLOBIN A1C: Hgb A1c MFr Bld: 5.4 % (ref 4.6–6.5)

## 2017-11-15 NOTE — Assessment & Plan Note (Signed)
Question recurrent infection. Wound culture sent off. Check CBC to rule out anemia and infection. Referral placed to wound clinic for further evaluation. Consider infectious disease if needed.

## 2017-11-15 NOTE — Patient Instructions (Signed)
Stop by the lab prior to leaving today. I will notify you of your results once received.   Stop by the front desk and speak Shirlee Limerick regarding your referral to the wound clinic.   Try sitz baths for discomfort. Be sure to keep the area clean and dry.   It was a pleasure to see you today!

## 2017-11-15 NOTE — Progress Notes (Signed)
Subjective:    Patient ID: [name], male    DOB: [birth year], 43 y.o.   MRN: [id number]  HPI  [name] is a 42 year old male with a history of perirectal abscess since late May 2019. He's undergone I&D to the perirectal area x 2 with his last procedure being in August 2019.   He's noticed bleeding intermittently since his last I&D in August 2019. Bleeding is daily but some weeks are less than others. He's wearing pads daily, sometimes soils two pads daily. This is causing chaffing to his buttocks. He denies fevers, weakness, fatigue.   Review of Systems  Constitutional: Negative for fever.  Skin: Positive for rash and wound.       Past Medical History:  Diagnosis Date  . Anorectal abscess   . Chickenpox 06/17/2017  . Essential hypertension 04/25/2017  . Hyperlipidemia   . Hypertension   . Hypertriglyceridemia 04/25/2017     Social History   Socioeconomic History  . Marital status: Married    Spouse name: Not on file  . Number of children: Not on file  . Years of education: Not on file  . Highest education level: Not on file  Occupational History  . Not on file  Social Needs  . Financial resource strain: Not on file  . Food insecurity:    Worry: Not on file    Inability: Not on file  . Transportation needs:    Medical: Not on file    Non-medical: Not on file  Tobacco Use  . Smoking status: Never Smoker  . Smokeless tobacco: Never Used  Substance and Sexual Activity  . Alcohol use: Yes  . Drug use: Never  . Sexual activity: Not on file  Lifestyle  . Physical activity:    Days per week: Not on file    Minutes per session: Not on file  . Stress: Not on file  Relationships  . Social connections:    Talks on phone: Not on file    Gets together: Not on file    Attends religious service: Not on file    Active member of club or organization: Not on file    Attends meetings of clubs or organizations: Not on file    Relationship status: Not on file   . Intimate partner violence:    Fear of current or ex partner: Not on file    Emotionally abused: Not on file    Physically abused: Not on file    Forced sexual activity: Not on file  Other Topics Concern  . Not on file  Social History Narrative   Married.   No children.   Works as Medical illustrator.    Enjoys playing guitar, lifting weights, walking.    No past surgical history on file.  Family History  Problem Relation Age of Onset  . Arthritis Mother   . Diabetes Mother   . Heart disease Mother   . Hyperlipidemia Mother   . Hypertension Mother   . Cancer Father        lung  . COPD Father   . Arthritis Sister   . Arthritis Maternal Grandmother   . Diabetes Maternal Grandmother   . Heart disease Maternal Grandmother   . Hyperlipidemia Maternal Grandmother   . Hypertension Maternal Grandmother   . Arthritis Maternal Grandfather   . Heart attack Maternal Grandfather   . Heart disease Maternal Grandfather   . Hyperlipidemia Maternal Grandfather   . Hypertension Maternal Grandfather   .  Arthritis Paternal Grandmother   . Melanoma Paternal Grandmother   . Hyperlipidemia Paternal Grandmother   . Hypertension Paternal Grandmother   . Arthritis Paternal Grandfather   . Diabetes Paternal Grandfather   . Heart attack Paternal Grandfather   . Hyperlipidemia Paternal Grandfather   . Hypertension Paternal Grandfather     No Known Allergies  Current Outpatient Medications on File Prior to Visit  Medication Sig Dispense Refill  . APPLE CIDER VINEGAR PO Take 450 mg by mouth daily.    . B Complex-Biotin-FA (B-COMPLEX PO) Take 35 mg by mouth daily.    Marland Kitchen doxycycline (VIBRA-TABS) 100 MG tablet Take 1 tablet (100 mg total) by mouth 2 (two) times daily. 20 tablet 0  . escitalopram (LEXAPRO) 10 MG tablet TAKE 1 TABLET BY MOUTH EVERY DAY 90 tablet 0  . famotidine (PEPCID) 20 MG tablet Take 20 mg by mouth daily.    . fenofibrate 160 MG tablet Take 1 tablet by mouth once daily  for high cholesterol. 90 tablet 3  . losartan (COZAAR) 100 MG tablet TAKE 1 TABLET BY MOUTH EVERY DAY FOR BLOOD PRESSURE 90 tablet 1  . Misc Natural Products (RELAX & SLEEP PO) Take 325 mg by mouth daily.    Marland Kitchen UNABLE TO FIND Take 2,000 mg by mouth 2 (two) times daily. Med Name: BEET ROOT     No current facility-administered medications on file prior to visit.     BP 136/80   Pulse 94   Temp 98.2 F (36.8 C) (Oral)   Ht 6\' 3"  (1.905 m)   Wt (!) 324 lb 12 oz (147.3 kg)   SpO2 97%   BMI 40.59 kg/m    Objective:   Physical Exam  Constitutional: He appears well-nourished.  Cardiovascular: Normal rate.  Respiratory: Effort normal.  Genitourinary:     Genitourinary Comments: Erythema with serosanguinous drainage in between gluteal folds. No obvious open wound. Skin breakdown noted to medial folds. Foul smell.    Skin: Skin is warm. There is erythema.           Assessment & Plan:

## 2017-11-18 ENCOUNTER — Other Ambulatory Visit: Payer: Self-pay | Admitting: Primary Care

## 2017-11-18 DIAGNOSIS — K611 Rectal abscess: Secondary | ICD-10-CM

## 2017-11-18 LAB — WOUND CULTURE
MICRO NUMBER:: 91317608
SPECIMEN QUALITY:: ADEQUATE

## 2017-11-18 MED ORDER — SULFAMETHOXAZOLE-TRIMETHOPRIM 800-160 MG PO TABS: 1.0000 | ORAL_TABLET | Freq: Two times a day (BID) | ORAL | 0 refills | Status: DC

## 2017-11-22 ENCOUNTER — Encounter: Payer: BLUE CROSS/BLUE SHIELD | Attending: Physician Assistant | Admitting: Physician Assistant

## 2017-11-22 DIAGNOSIS — M109 Gout, unspecified: Secondary | ICD-10-CM | POA: Insufficient documentation

## 2017-11-22 DIAGNOSIS — F411 Generalized anxiety disorder: Secondary | ICD-10-CM | POA: Insufficient documentation

## 2017-11-22 DIAGNOSIS — I10 Essential (primary) hypertension: Secondary | ICD-10-CM | POA: Insufficient documentation

## 2017-11-22 DIAGNOSIS — L0231 Cutaneous abscess of buttock: Secondary | ICD-10-CM | POA: Diagnosis present

## 2017-11-22 DIAGNOSIS — Z87891 Personal history of nicotine dependence: Secondary | ICD-10-CM | POA: Diagnosis not present

## 2017-11-22 DIAGNOSIS — Z881 Allergy status to other antibiotic agents status: Secondary | ICD-10-CM | POA: Diagnosis not present

## 2017-11-25 NOTE — Progress Notes (Addendum)
Cody, Padilla ([id number]) Visit Report for 11/22/2017 Allergy List Details Patient Name: [name], [name]. Date of Service: 11/22/2017 8:45 AM Medical Record Number: [id number] Patient Account Number: [id number] Date of Birth/Sex: [birth year] (42 y.o. Male) Treating RN: Huel Coventry Primary Care Natahlia Hoggard: Vernona Rieger Other Clinician: Referring Stevan Eberwein: Vernona Rieger Treating Chardae Mulkern/Extender: Linwood Dibbles, HOYT Weeks in Treatment: 0 Allergies Active Allergies No Known Drug Allergies Allergy Notes Electronic Signature(s) Signed: 11/22/2017 5:51:21 PM By: Elliot Gurney, BSN, RN, CWS, Kim RN, BSN Entered By: Elliot Gurney, BSN, RN, CWS, Kim on 11/22/2017 09:01:03 [name], [name] ([id number]) -------------------------------------------------------------------------------- Arrival Information Details Patient Name: Cody Pain D. Date of Service: 11/22/2017 8:45 AM Medical Record Number: [id number] Patient Account Number: [id number] Date of Birth/Sex: Sep 09, 1975 (42 y.o. Male) Treating RN: Curtis Sites Primary Care Lamin Chandley: Vernona Rieger Other Clinician: Referring Reyanna Baley: Vernona Rieger Treating Anysia Choi/Extender: Linwood Dibbles, HOYT Weeks in Treatment: 0 Visit Information Patient Arrived: Ambulatory Arrival Time: 08:46 Accompanied By: wife Transfer Assistance: None Patient Identification Verified: Yes Secondary Verification Process Completed: Yes Electronic Signature(s) Signed: 11/22/2017 5:51:21 PM By: Elliot Gurney, BSN, RN, CWS, Kim RN, BSN Entered By: Elliot Gurney, BSN, RN, CWS, Kim on 11/22/2017 08:59:43 [name], [name] ([id number]) -------------------------------------------------------------------------------- Clinic Level of Care Assessment Details Patient Name: Cody Pain D. Date of Service: 11/22/2017 8:45 AM Medical Record Number: [id number] Patient Account Number: [id number] Date of Birth/Sex: [birth year] (42 y.o. Male) Treating RN:  Curtis Sites Primary Care Gabreal Worton: Vernona Rieger Other Clinician: Referring Gwynneth Fabio: Vernona Rieger Treating Hulen Mandler/Extender: Linwood Dibbles, HOYT Weeks in Treatment: 0 Clinic Level of Care Assessment Items TOOL 2 Quantity Score []  - Use when only an EandM is performed on the INITIAL visit 0 ASSESSMENTS - Nursing Assessment / Reassessment X - General Physical Exam (combine w/ comprehensive assessment (listed just below) when 1 20 performed on new pt. evals) X- 1 25 Comprehensive Assessment (HX, ROS, Risk Assessments, Wounds Hx, etc.) ASSESSMENTS - Wound and Skin Assessment / Reassessment X - Simple Wound Assessment / Reassessment - one wound 1 5 []  - 0 Complex Wound Assessment / Reassessment - multiple wounds X- 1 10 Dermatologic / Skin Assessment (not related to wound area) ASSESSMENTS - Ostomy and/or Continence Assessment and Care []  - Incontinence Assessment and Management 0 []  - 0 Ostomy Care Assessment and Management (repouching, etc.) PROCESS - Coordination of Care X - Simple Patient / Family Education for ongoing care 1 15 []  - 0 Complex (extensive) Patient / Family Education for ongoing care []  - 0 Staff obtains Chiropractor, Records, Test Results / Process Orders []  - 0 Staff telephones HHA, Nursing Homes / Clarify orders / etc []  - 0 Routine Transfer to another Facility (non-emergent condition) []  - 0 Routine Hospital Admission (non-emergent condition) X- 1 15 New Admissions / Manufacturing engineer / Ordering NPWT, Apligraf, etc. []  - 0 Emergency Hospital Admission (emergent condition) X- 1 10 Simple Discharge Coordination []  - 0 Complex (extensive) Discharge Coordination PROCESS - Special Needs []  - Pediatric / Minor Patient Management 0 []  - 0 Isolation Patient Management [name], [name] ([id number]) []  - 0 Hearing / Language / Visual special needs []  - 0 Assessment of Community assistance (transportation, D/C planning, etc.) []  -  0 Additional assistance / Altered mentation []  - 0 Support Surface(s) Assessment (bed, cushion, seat, etc.) INTERVENTIONS - Wound Cleansing / Measurement X - Wound Imaging (photographs - any number of wounds) 1 5 []  - 0 Wound Tracing (instead of photographs) X- 1 5 Simple Wound Measurement - one wound []  - 0 Complex Wound Measurement -  multiple wounds X- 1 5 Simple Wound Cleansing - one wound []  - 0 Complex Wound Cleansing - multiple wounds INTERVENTIONS - Wound Dressings X - Small Wound Dressing one or multiple wounds 1 10 []  - 0 Medium Wound Dressing one or multiple wounds []  - 0 Large Wound Dressing one or multiple wounds []  - 0 Application of Medications - injection INTERVENTIONS - Miscellaneous []  - External ear exam 0 []  - 0 Specimen Collection (cultures, biopsies, blood, body fluids, etc.) []  - 0 Specimen(s) / Culture(s) sent or taken to Lab for analysis []  - 0 Patient Transfer (multiple staff / Nurse, adult / Similar devices) []  - 0 Simple Staple / Suture removal (25 or less) []  - 0 Complex Staple / Suture removal (26 or more) []  - 0 Hypo / Hyperglycemic Management (close monitor of Blood Glucose) []  - 0 Ankle / Brachial Index (ABI) - do not check if billed separately Has the patient been seen at the hospital within the last three years: Yes Total Score: 125 Level Of Care: New/Established - Level 4 Electronic Signature(s) Signed: 11/22/2017 5:34:16 PM By: Curtis Sites Entered By: Curtis Sites on 11/22/2017 09:50:47 [name], [name] ([id number]) -------------------------------------------------------------------------------- Encounter Discharge Information Details Patient Name: Cody Pain D. Date of Service: 11/22/2017 8:45 AM Medical Record Number: [id number] Patient Account Number: [id number] Date of Birth/Sex: Dec 16, 1975 (42 y.o. Male) Treating RN: Curtis Sites Primary Care Nicki Furlan: Vernona Rieger Other Clinician: Referring  Kamauri Denardo: Vernona Rieger Treating Jonell Brumbaugh/Extender: Linwood Dibbles, HOYT Weeks in Treatment: 0 Encounter Discharge Information Items Discharge Condition: Stable Ambulatory Status: Ambulatory Discharge Destination: Home Transportation: Private Auto Accompanied By: spouse Schedule Follow-up Appointment: Yes Clinical Summary of Care: Electronic Signature(s) Signed: 11/22/2017 5:34:16 PM By: Curtis Sites Entered By: Curtis Sites on 11/22/2017 09:57:23 Foulks, [name] ([id number]) -------------------------------------------------------------------------------- Lower Extremity Assessment Details Patient Name: Cody Pain D. Date of Service: 11/22/2017 8:45 AM Medical Record Number: [id number] Patient Account Number: [id number] Date of Birth/Sex: November 15, 1975 (42 y.o. Male) Treating RN: Huel Coventry Primary Care Andrewjames Weirauch: Vernona Rieger Other Clinician: Referring Malaak Stach: Vernona Rieger Treating Domenica Weightman/Extender: Linwood Dibbles, HOYT Weeks in Treatment: 0 Electronic Signature(s) Signed: 11/22/2017 5:51:21 PM By: Elliot Gurney, BSN, RN, CWS, Kim RN, BSN Entered By: Elliot Gurney, BSN, RN, CWS, Kim on 11/22/2017 09:00:51 [name], [name] ([id number]) -------------------------------------------------------------------------------- Multi Wound Chart Details Patient Name: Cody Pain D. Date of Service: 11/22/2017 8:45 AM Medical Record Number: [id number] Patient Account Number: [id number] Date of Birth/Sex: 09-25-1975 (42 y.o. Male) Treating RN: Curtis Sites Primary Care Murel Wigle: Vernona Rieger Other Clinician: Referring Abdou Stocks: Vernona Rieger Treating Kanetra Ho/Extender: Linwood Dibbles, HOYT Weeks in Treatment: 0 Vital Signs Height(in): 73 Pulse(bpm): 90 Weight(lbs): 319 Blood Pressure(mmHg): 190/93 Body Mass Index(BMI): 42 Temperature(F): 98.2 Respiratory Rate 16 (breaths/min): Photos: [1:No Photos] [N/A:N/A] Wound Location: [1:Right Gluteus - Medial]  [N/A:N/A] Wounding Event: [1:Gradually Appeared] [N/A:N/A] Primary Etiology: [1:Abscess] [N/A:N/A] Comorbid History: [1:Hypertension, Gout] [N/A:N/A] Date Acquired: [1:06/10/2017] [N/A:N/A] Weeks of Treatment: [1:0] [N/A:N/A] Wound Status: [1:Open] [N/A:N/A] Clustered Wound: [1:Yes] [N/A:N/A] Clustered Quantity: [1:3] [N/A:N/A] Measurements L x W x D [1:3x4x5.8] [N/A:N/A] (cm) Area (cm) : [1:9.425] [N/A:N/A] Volume (cm) : [1:54.664] [N/A:N/A] % Reduction in Area: [1:0.00%] [N/A:N/A] % Reduction in Volume: [1:0.00%] [N/A:N/A] Classification: [1:Full Thickness Without Exposed Support Structures] [N/A:N/A] Exudate Amount: [1:Large] [N/A:N/A] Exudate Type: [1:Sanguinous] [N/A:N/A] Exudate Color: [1:red] [N/A:N/A] Wound Margin: [1:Indistinct, nonvisible] [N/A:N/A] Granulation Amount: [1:Large (67-100%)] [N/A:N/A] Granulation Quality: [1:Red] [N/A:N/A] Necrotic Amount: [1:Small (1-33%)] [N/A:N/A] Exposed Structures: [1:Fat Layer (Subcutaneous Tissue) Exposed: Yes] [N/A:N/A] Epithelialization: [1:Large (67-100%)] [N/A:N/A] Periwound Skin Texture: [1:Rash: Yes] [N/A:N/A]  Periwound Skin Moisture: [1:No Abnormalities Noted] [N/A:N/A] Periwound Skin Color: [1:No Abnormalities Noted No] [N/A:N/A N/A] Treatment Notes [name], [name] ([id number]) Electronic Signature(s) Signed: 11/22/2017 5:34:16 PM By: Curtis Sites Entered By: Curtis Sites on 11/22/2017 09:43:34 [name], [name] ([id number]) -------------------------------------------------------------------------------- Multi-Disciplinary Care Plan Details Patient Name: Cody Pain D. Date of Service: 11/22/2017 8:45 AM Medical Record Number: [id number] Patient Account Number: [id number] Date of Birth/Sex: 1975-09-28 (42 y.o. Male) Treating RN: Curtis Sites Primary Care Blakeley Scheier: Vernona Rieger Other Clinician: Referring Zeanna Sunde: Vernona Rieger Treating Dessire Grimes/Extender: Linwood Dibbles, HOYT Weeks in  Treatment: 0 Active Inactive Electronic Signature(s) Signed: 11/29/2017 8:11:56 AM By: Elliot Gurney, BSN, RN, CWS, Kim RN, BSN Signed: 03/24/2018 10:46:12 AM By: Curtis Sites Previous Signature: 11/22/2017 5:34:16 PM Version By: Curtis Sites Entered By: Elliot Gurney BSN, RN, CWS, Kim on 11/29/2017 08:11:56 [name], [name] ([id number]) -------------------------------------------------------------------------------- Pain Assessment Details Patient Name: [name], [name] D. Date of Service: 11/22/2017 8:45 AM Medical Record Number: [id number] Patient Account Number: [id number] Date of Birth/Sex: 1975/04/06 (42 y.o. Male) Treating RN: Curtis Sites Primary Care Tyra Michelle: Vernona Rieger Other Clinician: Referring Shoua Ressler: Vernona Rieger Treating Asheton Viramontes/Extender: Linwood Dibbles, HOYT Weeks in Treatment: 0 Active Problems Location of Pain Severity and Description of Pain Patient Has Paino Yes Site Locations Rate the pain. Current Pain Level: 5 Pain Management and Medication Current Pain Management: Electronic Signature(s) Signed: 11/22/2017 5:34:16 PM By: Curtis Sites Signed: 11/22/2017 5:51:21 PM By: Elliot Gurney, BSN, RN, CWS, Kim RN, BSN Entered By: Elliot Gurney, BSN, RN, CWS, Kim on 11/22/2017 08:59:49 [name], [name] ([id number]) -------------------------------------------------------------------------------- Patient/Caregiver Education Details Patient Name: Cody Pain D. Date of Service: 11/22/2017 8:45 AM Medical Record Number: [id number] Patient Account Number: [id number] Date of Birth/Gender: July 14, 1975 (42 y.o. Male) Treating RN: Curtis Sites Primary Care Physician: Vernona Rieger Other Clinician: Referring Physician: Vernona Rieger Treating Physician/Extender: Skeet Simmer in Treatment: 0 Education Assessment Education Provided To: Patient and Caregiver Education Topics Provided Wound/Skin Impairment: Handouts: Other: moisture control Methods:  Demonstration, Explain/Verbal Responses: State content correctly Electronic Signature(s) Signed: 11/22/2017 5:34:16 PM By: Curtis Sites Entered By: Curtis Sites on 11/22/2017 09:50:20 [name], [name] ([id number]) -------------------------------------------------------------------------------- Wound Assessment Details Patient Name: Cody Pain D. Date of Service: 11/22/2017 8:45 AM Medical Record Number: [id number] Patient Account Number: [id number] Date of Birth/Sex: October 12, 1975 (42 y.o. Male) Treating RN: Huel Coventry Primary Care Laurelyn Terrero: Vernona Rieger Other Clinician: Referring Leesa Leifheit: Vernona Rieger Treating Lenette Rau/Extender: Linwood Dibbles, HOYT Weeks in Treatment: 0 Wound Status Wound Number: 1 Primary Etiology: Abscess Wound Location: Right Gluteus - Medial Wound Status: Open Wounding Event: Gradually Appeared Comorbid History: Hypertension, Gout Date Acquired: 06/10/2017 Weeks Of Treatment: 0 Clustered Wound: Yes Wound Measurements Length: (cm) 3 Width: (cm) 4 Depth: (cm) 5.8 Clustered Quantity: 3 Area: (cm) 9.425 Volume: (cm) 54.664 % Reduction in Area: 0% % Reduction in Volume: 0% Epithelialization: Large (67-100%) Tunneling: No Undermining: No Wound Description Full Thickness Without Exposed Support Classification: Structures Wound Margin: Indistinct, nonvisible Exudate Large Amount: Exudate Type: Sanguinous Exudate Color: red Foul Odor After Cleansing: No Slough/Fibrino No Wound Bed Granulation Amount: Large (67-100%) Exposed Structure Granulation Quality: Red Fat Layer (Subcutaneous Tissue) Exposed: Yes Necrotic Amount: Small (1-33%) Necrotic Quality: Adherent Slough Periwound Skin Texture Texture Color No Abnormalities Noted: No No Abnormalities Noted: No Rash: Yes Temperature / Pain Moisture Temperature: No Abnormality No Abnormalities Noted: No Tenderness on Palpation: Yes Dry / Scaly: No Maceration: Yes Wound  Preparation Ulcer Cleansing: Rinsed/Irrigated with Saline Topical Anesthetic Applied: None Assessment Notes [name], [name] ([id number]) wound has 3 openings  that all connect and track deep Electronic Signature(s) Signed: 11/22/2017 5:34:16 PM By: Curtis Sites Signed: 11/22/2017 5:51:21 PM By: Elliot Gurney, BSN, RN, CWS, Kim RN, BSN Entered By: Curtis Sites on 11/22/2017 09:56:52 Jasko, [name] ([id number]) -------------------------------------------------------------------------------- Vitals Details Patient Name: Cody Pain D. Date of Service: 11/22/2017 8:45 AM Medical Record Number: [id number] Patient Account Number: [id number] Date of Birth/Sex: 1975-12-04 (42 y.o. Male) Treating RN: Curtis Sites Primary Care Jeanmarie Mccowen: Vernona Rieger Other Clinician: Referring Jaelle Campanile: Vernona Rieger Treating Ada Woodbury/Extender: Linwood Dibbles, HOYT Weeks in Treatment: 0 Vital Signs Time Taken: 08:50 Temperature (F): 98.2 Height (in): 73 Pulse (bpm): 90 Weight (lbs): 319 Respiratory Rate (breaths/min): 16 Body Mass Index (BMI): 42.1 Blood Pressure (mmHg): 190/93 Reference Range: 80 - 120 mg / dl Electronic Signature(s) Signed: 11/22/2017 5:51:21 PM By: Elliot Gurney, BSN, RN, CWS, Kim RN, BSN Entered By: Elliot Gurney, BSN, RN, CWS, Kim on 11/22/2017 09:00:04

## 2017-11-25 NOTE — Progress Notes (Signed)
[name], [name] ([id number]) Visit Report for 11/22/2017 Abuse/Suicide Risk Screen Details Patient Name: [name], [name]. Date of Service: 11/22/2017 8:45 AM Medical Record Number: [id number] Patient Account Number: [id number] Date of Birth/Sex: 1975/06/20 (42 y.o. Male) Treating RN: Huel Coventry Primary Care Tremane Spurgeon: Vernona Rieger Other Clinician: Referring Dore Oquin: Vernona Rieger Treating Kavonte Bearse/Extender: Linwood Dibbles, HOYT Weeks in Treatment: 0 Abuse/Suicide Risk Screen Items Answer ABUSE/SUICIDE RISK SCREEN: Has anyone close to you tried to hurt or harm you recentlyo No Do you feel uncomfortable with anyone in your familyo No Has anyone forced you do things that you didnot want to doo No Do you have any thoughts of harming yourselfo No Patient displays signs or symptoms of abuse and/or neglect. No Electronic Signature(s) Signed: 11/22/2017 5:51:21 PM By: Elliot Gurney, BSN, RN, CWS, Kim RN, BSN Entered By: Elliot Gurney, BSN, RN, CWS, Kim on 11/22/2017 09:07:43 Hollander, [name] ([id number]) -------------------------------------------------------------------------------- Activities of Daily Living Details Patient Name: [name], [name] Date of Service: 11/22/2017 8:45 AM Medical Record Number: [id number] Patient Account Number: [id number] Date of Birth/Sex: Jan 09, 1976 (42 y.o. Male) Treating RN: Huel Coventry Primary Care Jasara Corrigan: Vernona Rieger Other Clinician: Referring Randeep Biondolillo: Vernona Rieger Treating Kieran Nachtigal/Extender: Linwood Dibbles, HOYT Weeks in Treatment: 0 Activities of Daily Living Items Answer Activities of Daily Living (Please select one for each item) Drive Automobile Completely Able Take Medications Completely Able Use Telephone Completely Able Care for Appearance Completely Able Use Toilet Completely Able Bath / Shower Completely Able Dress Self Completely Able Feed Self Completely Able Walk Completely Able Get In / Out Bed Completely  Able Housework Completely Able Prepare Meals Completely Able Handle Money Completely Able Shop for Self Completely Able Electronic Signature(s) Signed: 11/22/2017 5:51:21 PM By: Elliot Gurney, BSN, RN, CWS, Kim RN, BSN Entered By: Elliot Gurney, BSN, RN, CWS, Kim on 11/22/2017 09:07:52 Moeser, [name] ([id number]) -------------------------------------------------------------------------------- Education Assessment Details Patient Name: Cody Pain D. Date of Service: 11/22/2017 8:45 AM Medical Record Number: [id number] Patient Account Number: [id number] Date of Birth/Sex: 04/03/1975 (42 y.o. Male) Treating RN: Huel Coventry Primary Care Clete Kuch: Vernona Rieger Other Clinician: Referring Xoie Kreuser: Vernona Rieger Treating Kenishia Plack/Extender: Linwood Dibbles, HOYT Weeks in Treatment: 0 Primary Learner Assessed: Patient Learning Preferences/Education Level/Primary Language Learning Preference: Explanation, Demonstration Highest Education Level: College or Above Preferred Language: English Cognitive Barrier Assessment/Beliefs Language Barrier: No Translator Needed: No Memory Deficit: No Emotional Barrier: No Cultural/Religious Beliefs Affecting Medical Care: No Physical Barrier Assessment Impaired Vision: No Impaired Hearing: No Decreased Hand dexterity: No Knowledge/Comprehension Assessment Knowledge Level: High Comprehension Level: High Ability to understand written High instructions: Ability to understand verbal High instructions: Motivation Assessment Anxiety Level: Calm Cooperation: Cooperative Education Importance: Acknowledges Need Interest in Health Problems: Asks Questions Perception: Coherent Willingness to Engage in Self- High Management Activities: Readiness to Engage in Self- High Management Activities: Electronic Signature(s) Signed: 11/22/2017 5:51:21 PM By: Elliot Gurney, BSN, RN, CWS, Kim RN, BSN Entered By: Elliot Gurney, BSN, RN, CWS, Kim on 11/22/2017  09:08:18 Osorno, [name] ([id number]) -------------------------------------------------------------------------------- Fall Risk Assessment Details Patient Name: Cody Pain D. Date of Service: 11/22/2017 8:45 AM Medical Record Number: [id number] Patient Account Number: [id number] Date of Birth/Sex: February 18, 1975 (42 y.o. Male) Treating RN: Huel Coventry Primary Care Judyann Casasola: Vernona Rieger Other Clinician: Referring Lovada Barwick: Vernona Rieger Treating Lavana Huckeba/Extender: Linwood Dibbles, HOYT Weeks in Treatment: 0 Fall Risk Assessment Items Have you had 2 or more falls in the last 12 monthso 0 No Have you had any fall that resulted in injury in the last 12 monthso 0 No FALL RISK ASSESSMENT: History  of falling - immediate or within 3 months 0 No Secondary diagnosis 0 No Ambulatory aid None/bed rest/wheelchair/nurse 0 Yes Crutches/cane/walker 0 No Furniture 0 No IV Access/Saline Lock 0 No Gait/Training Normal/bed rest/immobile 0 Yes Weak 0 No Impaired 0 No Mental Status Oriented to own ability 0 Yes Electronic Signature(s) Signed: 11/22/2017 5:51:21 PM By: Elliot Gurney, BSN, RN, CWS, Kim RN, BSN Entered By: Elliot Gurney, BSN, RN, CWS, Kim on 11/22/2017 09:08:29 Molloy, [name] ([id number]) -------------------------------------------------------------------------------- Foot Assessment Details Patient Name: Cody Pain D. Date of Service: 11/22/2017 8:45 AM Medical Record Number: [id number] Patient Account Number: [id number] Date of Birth/Sex: February 10, 1975 (42 y.o. Male) Treating RN: Huel Coventry Primary Care Demetra Moya: Vernona Rieger Other Clinician: Referring Mazelle Huebert: Vernona Rieger Treating Kylyn Mcdade/Extender: Linwood Dibbles, HOYT Weeks in Treatment: 0 Foot Assessment Items Site Locations + = Sensation present, - = Sensation absent, C = Callus, U = Ulcer R = Redness, W = Warmth, M = Maceration, PU = Pre-ulcerative lesion F = Fissure, S = Swelling, D =  Dryness Assessment Right: Left: Other Deformity: No No Prior Foot Ulcer: No No Prior Amputation: No No Charcot Joint: No No Ambulatory Status: Ambulatory Without Help Gait: Steady Electronic Signature(s) Signed: 11/22/2017 5:51:21 PM By: Elliot Gurney, BSN, RN, CWS, Kim RN, BSN Entered By: Elliot Gurney, BSN, RN, CWS, Kim on 11/22/2017 09:09:03 Riemann, [name] ([id number]) -------------------------------------------------------------------------------- Nutrition Risk Assessment Details Patient Name: Cody Pain D. Date of Service: 11/22/2017 8:45 AM Medical Record Number: [id number] Patient Account Number: [id number] Date of Birth/Sex: [birth year] (42 y.o. Male) Treating RN: Huel Coventry Primary Care Rommie Dunn: Vernona Rieger Other Clinician: Referring Tenessa Marsee: Vernona Rieger Treating Kinsly Hild/Extender: Linwood Dibbles, HOYT Weeks in Treatment: 0 Height (in): 73 Weight (lbs): 319 Body Mass Index (BMI): 42.1 Nutrition Risk Assessment Items NUTRITION RISK SCREEN: I have an illness or condition that made me change the kind and/or amount of 0 No food I eat I eat fewer than two meals per day 0 No I eat few fruits and vegetables, or milk products 0 No I have three or more drinks of beer, liquor or wine almost every day 0 No I have tooth or mouth problems that make it hard for me to eat 0 No I don't always have enough money to buy the food I need 0 No I eat alone most of the time 0 No I take three or more different prescribed or over-the-counter drugs a day 1 Yes Without wanting to, I have lost or gained 10 pounds in the last six months 0 No I am not always physically able to shop, cook and/or feed myself 0 No Nutrition Protocols Good Risk Protocol 0 No interventions needed Moderate Risk Protocol Electronic Signature(s) Signed: 11/22/2017 5:51:21 PM By: Elliot Gurney, BSN, RN, CWS, Kim RN, BSN Entered By: Elliot Gurney, BSN, RN, CWS, Kim on 11/22/2017 09:08:52

## 2017-11-25 NOTE — Progress Notes (Signed)
NOCHUM, FENTER ([id number]) Visit Report for 11/22/2017 Chief Complaint Document Details Patient Name: [name], [name]. Date of Service: 11/22/2017 8:45 AM Medical Record Number: [id number] Patient Account Number: [id number] Date of Birth/Sex: [birth year] (41 y.o. Male) Treating RN: [name] Primary Care Provider: Vernona Padilla Other Clinician: Referring Provider: Vernona Padilla Treating Provider/Extender: [name], [name] Weeks in Treatment: 0 Information Obtained from: Patient Chief Complaint Right medial gluteus abscess Electronic Signature(s) Signed: 11/22/2017 1:11:56 PM By: Cody Kelp PA-C Entered By: [name] on 11/22/2017 10:52:24 [name], [name] ([id number]) -------------------------------------------------------------------------------- HPI Details Patient Name: Cody Pain D. Date of Service: 11/22/2017 8:45 AM Medical Record Number: [id number] Patient Account Number: [id number] Date of Birth/Sex: [birth year] (42 y.o. Male) Treating RN: [name] Primary Care Provider: Vernona Padilla Other Clinician: Referring Provider: Vernona Padilla Treating Provider/Extender: [name], [name] Weeks in Treatment: 0 History of Present Illness HPI Description: 11/22/17 on evaluation today patient presents for initial inspection and clinic concerning issues that he has been having in the perianal region specifically towards the right buttock side him which he has been dealing with since around May 2019. He subsequently has been on several antibiotics during this time. At one point he was on doxycycline. Most recently he has been placed on Bactrim as of last week. With that being said he has seen surgeons twice here in [place] where he's had incision and drainage of the region in order to clear away the abscess. He did have a CT scan performed on 06/13/17. At that point the patient had a 16.8 x 9.9 x 7.2 cm multi-faceted abscess  noted in the peroneal region which surrounded the anus and extended anteriorly and superiorly involving the base of the penis. There was a concern for developing Fourier's gangrene. Following treatment the patient apparently had one other instance of having a incision and drainage after the initial 06/13/17 time. Nonetheless in general this has not seem to do very well unfortunately. There is still a significant area of undermining/potential abscess noted during evaluation today. He continues to have some discomfort as well and is also having a lot of irritation/dermatitis in the region surrounding the perianal area. Electronic Signature(s) Signed: 11/22/2017 1:11:56 PM By: Cody Kelp PA-C Entered By: [name] on 11/22/2017 13:07:37 [name], [name] ([id number]) -------------------------------------------------------------------------------- Physical Exam Details Patient Name: Cody Pain D. Date of Service: 11/22/2017 8:45 AM Medical Record Number: [id number] Patient Account Number: [id number] Date of Birth/Sex: [birth year] (42 y.o. Male) Treating RN: [name] Primary Care Provider: Vernona Padilla Other Clinician: Referring Provider: Vernona Padilla Treating Provider/Extender: [name], [name] Weeks in Treatment: 0 Constitutional patient is hypertensive.. pulse regular and within target range for patient.[name] respirations regular, non-labored and within target range for patient.[name] temperature within target range for patient.. Well-nourished and well-hydrated in no acute distress. Eyes conjunctiva clear no eyelid edema noted. pupils equal round and reactive to light and accommodation. Ears, Nose, Mouth, and Throat no gross abnormality of ear auricles or external auditory canals. normal hearing noted during conversation. mucus membranes moist. Respiratory normal breathing without difficulty. clear to auscultation bilaterally. Cardiovascular regular rate and  rhythm with normal S1, S2. no clubbing, cyanosis, significant edema, <3 sec cap refill. Gastrointestinal (GI) soft, non-tender, non-distended, +BS. no ventral hernia noted. Musculoskeletal normal gait and posture. no significant deformity or arthritic changes, no loss or range of motion, no clubbing. Integumentary (Hair, Skin) Patient does have some irritation around the perianal skin region which appears to potentially be used like in nature.[name]  Psychiatric this patient is able to make decisions and demonstrates good insight into disease process. Alert and Oriented x 3. pleasant and cooperative. Notes Upon inspection today the patient actually has three openings noted at this point in time. In regard to these openings he unfortunately does have a connection between all three there is definitely a generalize region of undermining noted at this point. Unfortunately it also seems that he may have a fistula extending from the lateral opening down to the base of the penis under the skin that goes more posterior however. This has me concerned as the entire link that the site was around 10.3 as measured by skinny probe. Nonetheless I believe that this could represent a continued an ongoing abscess down into this region the patient has had lower abdominal/pelvic pain more to the right side versus the left he tells me at times recently as well. Electronic Signature(s) Signed: 11/22/2017 1:11:56 PM By: Cody Kelp PA-C Entered By: [name] on 11/22/2017 13:09:27 [name], [name] ([id number]) -------------------------------------------------------------------------------- Physician Orders Details Patient Name: Cody Pain D. Date of Service: 11/22/2017 8:45 AM Medical Record Number: [id number] Patient Account Number: [id number] Date of Birth/Sex: Apr 05, 1975 (42 y.o. Male) Treating RN: [name] Primary Care Provider: Vernona Padilla Other Clinician: Referring  Provider: Vernona Padilla Treating Provider/Extender: [name], [name] Weeks in Treatment: 0 Verbal / Phone Orders: No Diagnosis Coding ICD-10 Coding Code Description I10 Essential (primary) hypertension F41.1 Generalized anxiety disorder Skin Barriers/Peri-Wound Care Wound #1 Right,Medial Gluteus o Antifungal powder-Nystatin Discharge From [facility] Wound #1 Right,Medial Gluteus o Discharge from Wound Care Center - Please follow up with [place] here at the [facility] if you need Consults o General Surgery - [facility] referral Patient Medications Allergies: No Known Drug Allergies Notifications Medication Indication Start End nystatin 11/22/2017 DOSE topical 100,000 unit/gram powder - powder topical applied topically to the peri-anal region 2 times a day where the skin is irritated Electronic Signature(s) Signed: 11/22/2017 10:03:42 AM By: Cody Kelp PA-C Entered By: [name] on 11/22/2017 10:03:42 [place]urko, [name] ([id number]) -------------------------------------------------------------------------------- Problem List Details Patient Name: Cody Pain D. Date of Service: 11/22/2017 8:45 AM Medical Record Number: [id number] Patient Account Number: [id number] Date of Birth/Sex: May 16, 1975 (42 y.o. Male) Treating RN: [name] Primary Care Provider: Vernona Padilla Other Clinician: Referring Provider: Vernona Padilla Treating Provider/Extender: [name], [name] Weeks in Treatment: 0 Active Problems ICD-10 Evaluated Encounter Code Description Active Date Today Diagnosis L02.31 Cutaneous abscess of buttock 11/22/2017 No Yes I10 Essential (primary) hypertension 11/22/2017 No Yes F41.1 Generalized anxiety disorder 11/22/2017 No Yes Inactive Problems Resolved Problems Electronic Signature(s) Signed: 11/22/2017 1:11:56 PM By: Cody Kelp PA-C Entered By: [name] on 11/22/2017 10:52:04 [place],  [name] ([id number]) -------------------------------------------------------------------------------- Progress Note Details Patient Name: Cody Pain D. Date of Service: 11/22/2017 8:45 AM Medical Record Number: [id number] Patient Account Number: [id number] Date of Birth/Sex: 24-May-1975 (42 y.o. Male) Treating RN: [name] Primary Care Provider: Vernona Padilla Other Clinician: Referring Provider: Vernona Padilla Treating Provider/Extender: [name], [name] Weeks in Treatment: 0 Subjective Chief Complaint Information obtained from Patient Right medial gluteus abscess History of Present Illness (HPI) 11/22/17 on evaluation today patient presents for initial inspection and clinic concerning issues that he has been having in the perianal region specifically towards the right buttock side him which he has been dealing with since around May 2019. He subsequently has been on several antibiotics during this time. At one point he was on doxycycline.  Most recently he has been placed on Bactrim as of last week. With that being said he has seen surgeons twice here in [place] where he's had incision and drainage of the region in order to clear away the abscess. He did have a CT scan performed on 06/13/17. At that point the patient had a 16.8 x 9.9 x 7.2 cm multi-faceted abscess noted in the peroneal region which surrounded the anus and extended anteriorly and superiorly involving the base of the penis. There was a concern for developing Fourier's gangrene. Following treatment the patient apparently had one other instance of having a incision and drainage after the initial 06/13/17 time. Nonetheless in general this has not seem to do very well unfortunately. There is still a significant area of undermining/potential abscess noted during evaluation today. He continues to have some discomfort as well and is also having a lot of irritation/dermatitis in the region surrounding the  perianal area. Wound History Patient presents with 1 open wound that has been present for approximately 6 months. Patient has been treating wound in the following manner: desitin. Laboratory tests have been performed in the last month. Patient reportedly has tested positive for an antibiotic resistant organism. Patient reportedly has not tested positive for osteomyelitis. Patient reportedly has not had testing performed to evaluate circulation in the legs. Patient experiences the following problems associated with their wounds: infection. Patient History Information obtained from Patient. Allergies No Known Drug Allergies Family History Cancer - Paternal Grandparents, Diabetes - Maternal Grandparents,Mother, Heart Disease - Paternal Grandparents,Maternal Grandparents,Father, Hypertension - Paternal Grandparents,Mother,Maternal Grandparents, Kidney Disease - Paternal Grandparents, Lung Disease - Father, Stroke - Paternal Grandparents, No family history of Seizures, Thyroid Problems, Tuberculosis. Social History Former smoker, Marital Status - Married, Alcohol Use - Moderate, Drug Use - No History, Caffeine Use - Rarely. Medical History Eyes Denies history of Cataracts, Glaucoma, Optic Neuritis Ear/Nose/Mouth/Throat Denies history of Chronic sinus problems/congestion, Middle ear problems Luhrs, [name] ([id number]) Hematologic/Lymphatic Denies history of Anemia, Hemophilia, Human Immunodeficiency Virus, Lymphedema, Sickle Cell Disease Respiratory Denies history of Aspiration, Asthma, Chronic Obstructive Pulmonary Disease (COPD), Pneumothorax, Sleep Apnea, Tuberculosis Cardiovascular Patient has history of Hypertension Denies history of Angina, Arrhythmia, Congestive Heart Failure, Coronary Artery Disease, Deep Vein Thrombosis, Hypotension, Myocardial Infarction, Peripheral Arterial Disease, Peripheral Venous Disease, Phlebitis, Vasculitis Gastrointestinal Denies history of  Cirrhosis , Colitis, Crohn s, Hepatitis A, Hepatitis B, Hepatitis C Endocrine Denies history of Type I Diabetes, Type II Diabetes Genitourinary Denies history of End Stage Renal Disease Immunological Denies history of Lupus Erythematosus, Raynaud s, Scleroderma Integumentary (Skin) Denies history of History of Burn, History of pressure wounds Musculoskeletal Patient has history of Gout Denies history of Rheumatoid Arthritis, Osteoarthritis, Osteomyelitis Neurologic Denies history of Dementia, Neuropathy, Quadriplegia, Paraplegia, Seizure Disorder Oncologic Denies history of Received Chemotherapy, Received Radiation Psychiatric Denies history of Anorexia/bulimia, Confinement Anxiety Review of Systems (ROS) Constitutional Symptoms (General Health) The patient has no complaints or symptoms. Eyes The patient has no complaints or symptoms. Ear/Nose/Mouth/Throat The patient has no complaints or symptoms. Hematologic/Lymphatic The patient has no complaints or symptoms. Respiratory The patient has no complaints or symptoms. Cardiovascular The patient has no complaints or symptoms. Gastrointestinal The patient has no complaints or symptoms. Endocrine The patient has no complaints or symptoms. Genitourinary The patient has no complaints or symptoms. Immunological The patient has no complaints or symptoms. Integumentary (Skin) Complains or has symptoms of Wounds. Denies complaints or symptoms of Bleeding or bruising tendency, Breakdown, Swelling. Musculoskeletal The patient has no complaints or  symptoms. Neurologic The patient has no complaints or symptoms. Oncologic The patient has no complaints or symptoms. Psychiatric Jacinto, Cody BARBATO. ([id number]) Complains or has symptoms of Anxiety. Denies complaints or symptoms of Claustrophobia. Objective Constitutional patient is hypertensive.. pulse regular and within target range for patient.[name] respirations regular,  non-labored and within target range for patient.[name] temperature within target range for patient.. Well-nourished and well-hydrated in no acute distress. Vitals Time Taken: 8:50 AM, Height: 73 in, Weight: 319 lbs, BMI: 42.1, Temperature: 98.2 F, Pulse: 90 bpm, Respiratory Rate: 16 breaths/min, Blood Pressure: 190/93 mmHg. Eyes conjunctiva clear no eyelid edema noted. pupils equal round and reactive to light and accommodation. Ears, Nose, Mouth, and Throat no gross abnormality of ear auricles or external auditory canals. normal hearing noted during conversation. mucus membranes moist. Respiratory normal breathing without difficulty. clear to auscultation bilaterally. Cardiovascular regular rate and rhythm with normal S1, S2. no clubbing, cyanosis, significant edema, Gastrointestinal (GI) soft, non-tender, non-distended, +BS. no ventral hernia noted. Musculoskeletal normal gait and posture. no significant deformity or arthritic changes, no loss or range of motion, no clubbing. Psychiatric this patient is able to make decisions and demonstrates good insight into disease process. Alert and Oriented x 3. pleasant and cooperative. General Notes: Upon inspection today the patient actually has three openings noted at this point in time. In regard to these openings he unfortunately does have a connection between all three there is definitely a generalize region of undermining noted at this point. Unfortunately it also seems that he may have a fistula extending from the lateral opening down to the base of the penis under the skin that goes more posterior however. This has me concerned as the entire link that the site was around 10.3 as measured by skinny probe. Nonetheless I believe that this could represent a continued an ongoing abscess down into this region the patient has had lower abdominal/pelvic pain more to the right side versus the left he tells me at times recently as well. Integumentary  (Hair, Skin) Patient does have some irritation around the perianal skin region which appears to potentially be used like in nature.. Wound #1 status is Open. Original cause of wound was Gradually Appeared. The wound is located on the Right,Medial Gluteus. The wound measures 3cm length x 4cm width x 5.8cm depth; 9.425cm^2 area and 54.664cm^3 volume. There is Fat Layer (Subcutaneous Tissue) Exposed exposed. There is no tunneling or undermining noted. There is a large amount of sanguinous drainage noted. The wound margin is indistinct and nonvisible. There is large (67-100%) red granulation within Podoll, Kahle D. ([id number]) the wound bed. There is a small (1-33%) amount of necrotic tissue within the wound bed including Adherent Slough. The periwound skin appearance exhibited: Rash, Maceration. The periwound skin appearance did not exhibit: Dry/Scaly. Periwound temperature was noted as No Abnormality. The periwound has tenderness on palpation. General Notes: wound has 3 openings that all connect and track deep Assessment Active Problems ICD-10 Cutaneous abscess of buttock Essential (primary) hypertension Generalized anxiety disorder Plan Skin Barriers/Peri-Wound Care: Wound #1 Right,Medial Gluteus: Antifungal powder-Nystatin Discharge From [facility] Services: Wound #1 Right,Medial Gluteus: Discharge from Wound Care Center - Please follow up with [place] here at the [facility] if you need Consults ordered were: General Surgery - [facility] referral The following medication(s) was prescribed: nystatin topical 100,000 unit/gram powder powder topical applied topically to the peri-anal region 2 times a day where the skin is irritated starting 11/22/2017 At this point my suggestion based  on what I'm seeing at this time is going to be a referral to the surgeon at [facility] which was the patient's choice between [place] and [facility]. We will subsequently see what they have to say  in regard to the issue that is going on at this point. I really do not feel there's anything for wound care perspective that we can likely offer at this time at least not with the current state of affairs. I did give him a nystatin powder to use over the irritated skin of the perianal region hopefully this will at least be of some benefit for him. Nonetheless in regard to the actual depth and potential continued abscess I think he may need to see a surgeon to discuss options going forward. He's in agreement with this plan. We will see him back as needed depending on whether or not he needs to come back from wound care in the future. For now I think he can continue to do what he's doing above using the menstrual cycle pads which are definitely hoping to absorb some of the fluid and hopefully nystatin will be of benefit as well. Electronic Signature(s) Signed: 11/22/2017 1:11:56 PM By: Cody Kelp PA-C Entered By: [name] on 11/22/2017 13:10:59 [place], [name] ([id number]) -------------------------------------------------------------------------------- ROS/PFSH Details Patient Name: Cody Pain D. Date of Service: 11/22/2017 8:45 AM Medical Record Number: [id number] Patient Account Number: [id number] Date of Birth/Sex: 10-01-1975 (42 y.o. Male) Treating RN: Huel Coventry Primary Care Provider: Vernona Padilla Other Clinician: Referring Provider: Vernona Padilla Treating Provider/Extender: [name], [name] Weeks in Treatment: 0 Information Obtained From Patient Wound History Do you currently have one or more open woundso Yes How many open wounds do you currently haveo 1 Approximately how long have you had your woundso 6 months How have you been treating your wound(s) until nowo desitin Has your wound(s) ever healed and then re-openedo No Have you had any lab work done in the past montho Yes Have you tested positive for osteomyelitis (bone infection)o No Have you  had any tests for circulation on your legso No Have you had other problems associated with your woundso Infection Integumentary (Skin) Complaints and Symptoms: Positive for: Wounds Negative for: Bleeding or bruising tendency; Breakdown; Swelling Medical History: Negative for: History of Burn; History of pressure wounds Psychiatric Complaints and Symptoms: Positive for: Anxiety Negative for: Claustrophobia Medical History: Negative for: Anorexia/bulimia; Confinement Anxiety Constitutional Symptoms (General Health) Complaints and Symptoms: No Complaints or Symptoms Eyes Complaints and Symptoms: No Complaints or Symptoms Medical History: Negative for: Cataracts; Glaucoma; Optic Neuritis Ear/Nose/Mouth/Throat Complaints and Symptoms: No Complaints or Symptoms Medical History: LEVIN, DAGOSTINO ([id number]) Negative for: Chronic sinus problems/congestion; Middle ear problems Hematologic/Lymphatic Complaints and Symptoms: No Complaints or Symptoms Medical History: Negative for: Anemia; Hemophilia; Human Immunodeficiency Virus; Lymphedema; Sickle Cell Disease Respiratory Complaints and Symptoms: No Complaints or Symptoms Medical History: Negative for: Aspiration; Asthma; Chronic Obstructive Pulmonary Disease (COPD); Pneumothorax; Sleep Apnea; Tuberculosis Cardiovascular Complaints and Symptoms: No Complaints or Symptoms Medical History: Positive for: Hypertension Negative for: Angina; Arrhythmia; Congestive Heart Failure; Coronary Artery Disease; Deep Vein Thrombosis; Hypotension; Myocardial Infarction; Peripheral Arterial Disease; Peripheral Venous Disease; Phlebitis; Vasculitis Gastrointestinal Complaints and Symptoms: No Complaints or Symptoms Medical History: Negative for: Cirrhosis ; Colitis; Crohnos; Hepatitis A; Hepatitis B; Hepatitis C Endocrine Complaints and Symptoms: No Complaints or Symptoms Medical History: Negative for: Type I Diabetes; Type II  Diabetes Genitourinary Complaints and Symptoms: No Complaints or Symptoms Medical History: Negative for: End Stage Renal  Disease Immunological Complaints and Symptoms: No Complaints or Symptoms Medical History: CON, ARGANBRIGHT. ([id number]) Negative for: Lupus Erythematosus; Raynaudos; Scleroderma Musculoskeletal Complaints and Symptoms: No Complaints or Symptoms Medical History: Positive for: Gout Negative for: Rheumatoid Arthritis; Osteoarthritis; Osteomyelitis Neurologic Complaints and Symptoms: No Complaints or Symptoms Medical History: Negative for: Dementia; Neuropathy; Quadriplegia; Paraplegia; Seizure Disorder Oncologic Complaints and Symptoms: No Complaints or Symptoms Medical History: Negative for: Received Chemotherapy; Received Radiation Immunizations Pneumococcal Vaccine: Received Pneumococcal Vaccination: No Tetanus Vaccine: Last tetanus shot: 04/16/2014 Implantable Devices Family and Social History Cancer: Yes - Paternal Grandparents; Diabetes: Yes - Maternal Grandparents,Mother; Heart Disease: Yes - Paternal Grandparents,Maternal Grandparents,Father; Hypertension: Yes - Paternal Grandparents,Mother,Maternal Grandparents; Kidney Disease: Yes - Paternal Grandparents; Lung Disease: Yes - Father; Seizures: No; Stroke: Yes - Paternal Grandparents; Thyroid Problems: No; Tuberculosis: No; Former smoker; Marital Status - Married; Alcohol Use: Moderate; Drug Use: No History; Caffeine Use: Rarely; Advanced Directives: No; Patient does not want information on Advanced Directives; Do not resuscitate: No; Living Will: No; Medical Power of Attorney: No Electronic Signature(s) Signed: 11/22/2017 1:11:56 PM By: Cody Kelp PA-C Signed: 11/22/2017 5:51:21 PM By: Elliot Gurney, BSN, RN, CWS, Kim RN, BSN Entered By: Elliot Gurney, BSN, RN, CWS, Kim on 11/22/2017 09:07:01 [place], [name]  ([id number]) -------------------------------------------------------------------------------- SuperBill Details Patient Name: Cody Pain D. Date of Service: 11/22/2017 Medical Record Number: [id number] Patient Account Number: [id number] Date of Birth/Sex: 09-Apr-1975 (42 y.o. Male) Treating RN: [name] Primary Care Provider: Vernona Padilla Other Clinician: Referring Provider: Vernona Padilla Treating Provider/Extender: [name], [name] Weeks in Treatment: 0 Diagnosis Coding ICD-10 Codes Code Description L02.31 Cutaneous abscess of buttock I10 Essential (primary) hypertension F41.1 Generalized anxiety disorder Facility Procedures CPT4 Code: [id number] Description: 99214 - WOUND CARE VISIT-LEV 4 EST PT Modifier: Quantity: 1 Physician Procedures CPT4 Code: [id number] Description: 99204 - WC PHYS LEVEL 4 - NEW PT ICD-10 Diagnosis Description L02.31 Cutaneous abscess of buttock I10 Essential (primary) hypertension F41.1 Generalized anxiety disorder Modifier: Quantity: 1 Electronic Signature(s) Signed: 11/22/2017 1:11:56 PM By: Cody Kelp PA-C Entered By: [name] on 11/22/2017 13:11:14

## 2017-11-26 DIAGNOSIS — K611 Rectal abscess: Secondary | ICD-10-CM

## 2017-11-26 MED ORDER — TRAMADOL HCL 50 MG PO TABS: 50.0000 mg | ORAL_TABLET | Freq: Three times a day (TID) | ORAL | 0 refills | Status: DC | PRN

## 2017-12-06 DIAGNOSIS — K611 Rectal abscess: Secondary | ICD-10-CM

## 2017-12-06 MED ORDER — TRAMADOL HCL 50 MG PO TABS: 50.0000 mg | ORAL_TABLET | Freq: Three times a day (TID) | ORAL | 0 refills | Status: DC | PRN

## 2017-12-14 ENCOUNTER — Other Ambulatory Visit: Payer: Self-pay | Admitting: Primary Care

## 2017-12-14 DIAGNOSIS — K611 Rectal abscess: Secondary | ICD-10-CM

## 2017-12-21 DIAGNOSIS — K611 Rectal abscess: Secondary | ICD-10-CM

## 2017-12-23 MED ORDER — TRAMADOL HCL 50 MG PO TABS: 50.0000 mg | ORAL_TABLET | Freq: Three times a day (TID) | ORAL | 0 refills | Status: DC | PRN

## 2017-12-26 ENCOUNTER — Telehealth: Payer: Self-pay | Admitting: Emergency Medicine

## 2017-12-26 NOTE — Telephone Encounter (Signed)
Patient's wife called stating that Jae DireKate was suppose to write patient a letter for work but they do not see it in his chart. Wondering if letter can be written and sent via MyChart today. Informed wife I would [facility] the message to [facility].

## 2017-12-27 NOTE — Telephone Encounter (Signed)
Spoken to patient and she received via MyChart.

## 2017-12-31 ENCOUNTER — Other Ambulatory Visit: Payer: Self-pay | Admitting: Primary Care

## 2017-12-31 DIAGNOSIS — F411 Generalized anxiety disorder: Secondary | ICD-10-CM

## 2018-01-15 DIAGNOSIS — L081 Erythrasma: Secondary | ICD-10-CM

## 2018-01-15 HISTORY — DX: Erythrasma: L08.1

## 2018-02-04 DIAGNOSIS — L732 Hidradenitis suppurativa: Secondary | ICD-10-CM

## 2018-02-11 ENCOUNTER — Encounter: Payer: Self-pay | Admitting: Primary Care

## 2018-02-14 ENCOUNTER — Ambulatory Visit: Payer: BLUE CROSS/BLUE SHIELD | Admitting: Primary Care

## 2018-02-14 ENCOUNTER — Telehealth: Payer: Self-pay | Admitting: Primary Care

## 2018-02-14 DIAGNOSIS — K611 Rectal abscess: Secondary | ICD-10-CM

## 2018-02-14 NOTE — Telephone Encounter (Signed)
Completed and placed on Robin's desk.  I believe the patient will be picking this up Monday, Zella Ball will you please review?

## 2018-02-14 NOTE — Telephone Encounter (Signed)
Pt dropped off FMLA forms to be filled out regarding 02/14/18 appt. I placed in your in box. Pt said he would pick up Monday morning.

## 2018-02-14 NOTE — Assessment & Plan Note (Signed)
Chronic, thought to be hidradenitis but this is up for question.  Following with dermatology and is awaiting biopsy results. Will approve FMLA with a tentative return to work date of February 10th.  He will update if this changes. Exam stable.

## 2018-02-14 NOTE — Patient Instructions (Signed)
Speak with Zella Ball regarding your FMLA paperwork. We will put a tentative return to work date for 02/24/18. Please keep me in the loop regarding your results and future appointments.  It was a pleasure to see you today!

## 2018-02-14 NOTE — Progress Notes (Signed)
Subjective:    Patient ID: [name], male    DOB: 12/18/1975, 43 y.o.   MRN: [id number]  HPI  [name] is a 43 year old male who presents today to discuss FMLA paperwork.  He has a history of recurrent perirectal abscess since May 2019. He has undergone evaluation and surgery several times over the last year with two different surgeons.   His last surgery was on 12/30/17 where he was determined to have hidradenitis which was suspected to the the cause for his recurrent infections. His last follow up visit with his surgeon was on 01/28/18, was released and referred to dermatology.  He was evaluated at [facility] on 02/10/18 who is suspecting he as ALS or Crohn's, not hidradenitis. Biopsies were taken and we are waiting on reports. He as been out of work since January 22nd due to "flare up" which include a lot of pain and discomfort. He has been unable to climb ladders, move under sinks, ride golf carts due to the pain in his perirectal area. This is all apart of his job activities.   He is spending most of time laying around at the house. He has not yet heard back from dermatology regarding biopsies, is expecting to have a follow up visit within the next several days. He is needing FMLA to protect his job. He suspects he may have appointments next week and will be able to tentatively return to work on February 10th. He will update if this changes.   He denies fevers, does have some chills. No antibiotic use since December 2019.   Review of Systems  Constitutional: Negative for chills and fever.  Skin: Positive for wound.       Past Medical History:  Diagnosis Date  . Anorectal abscess   . Chickenpox 06/17/2017  . Essential hypertension 04/25/2017  . Hyperlipidemia   . Hypertension   . Hypertriglyceridemia 04/25/2017     Social History   Socioeconomic History  . Marital status: Married    Spouse name: Not on file  . Number of children: Not on file  .  Years of education: Not on file  . Highest education level: Not on file  Occupational History  . Not on file  Social Needs  . Financial resource strain: Not on file  . Food insecurity:    Worry: Not on file    Inability: Not on file  . Transportation needs:    Medical: Not on file    Non-medical: Not on file  Tobacco Use  . Smoking status: Never Smoker  . Smokeless tobacco: Never Used  Substance and Sexual Activity  . Alcohol use: Yes  . Drug use: Never  . Sexual activity: Not on file  Lifestyle  . Physical activity:    Days per week: Not on file    Minutes per session: Not on file  . Stress: Not on file  Relationships  . Social connections:    Talks on phone: Not on file    Gets together: Not on file    Attends religious service: Not on file    Active member of club or organization: Not on file    Attends meetings of clubs or organizations: Not on file    Relationship status: Not on file  . Intimate partner violence:    Fear of current or ex partner: Not on file    Emotionally abused: Not on file    Physically abused: Not on file    Forced  sexual activity: Not on file  Other Topics Concern  . Not on file  Social History Narrative   Married.   No children.   Works as Medical illustrator.    Enjoys playing guitar, lifting weights, walking.    No past surgical history on file.  Family History  Problem Relation Age of Onset  . Arthritis Mother   . Diabetes Mother   . Heart disease Mother   . Hyperlipidemia Mother   . Hypertension Mother   . Cancer Father        lung  . COPD Father   . Arthritis Sister   . Arthritis Maternal Grandmother   . Diabetes Maternal Grandmother   . Heart disease Maternal Grandmother   . Hyperlipidemia Maternal Grandmother   . Hypertension Maternal Grandmother   . Arthritis Maternal Grandfather   . Heart attack Maternal Grandfather   . Heart disease Maternal Grandfather   . Hyperlipidemia Maternal Grandfather   . Hypertension  Maternal Grandfather   . Arthritis Paternal Grandmother   . Melanoma Paternal Grandmother   . Hyperlipidemia Paternal Grandmother   . Hypertension Paternal Grandmother   . Arthritis Paternal Grandfather   . Diabetes Paternal Grandfather   . Heart attack Paternal Grandfather   . Hyperlipidemia Paternal Grandfather   . Hypertension Paternal Grandfather     No Known Allergies  Current Outpatient Medications on File Prior to Visit  Medication Sig Dispense Refill  . APPLE CIDER VINEGAR PO Take 450 mg by mouth daily.    . B Complex-Biotin-FA (B-COMPLEX PO) Take 35 mg by mouth daily.    Marland Kitchen doxycycline (VIBRA-TABS) 100 MG tablet Take 1 tablet (100 mg total) by mouth 2 (two) times daily. 20 tablet 0  . escitalopram (LEXAPRO) 10 MG tablet TAKE 1 TABLET BY MOUTH EVERY DAY 90 tablet 1  . famotidine (PEPCID) 20 MG tablet Take 20 mg by mouth daily.    . fenofibrate 160 MG tablet Take 1 tablet by mouth once daily for high cholesterol. 90 tablet 3  . losartan (COZAAR) 100 MG tablet TAKE 1 TABLET BY MOUTH EVERY DAY FOR BLOOD PRESSURE 90 tablet 1  . Misc Natural Products (RELAX & SLEEP PO) Take 325 mg by mouth daily.    Marland Kitchen sulfamethoxazole-trimethoprim (BACTRIM DS,SEPTRA DS) 800-160 MG tablet Take 1 tablet by mouth 2 (two) times daily. 14 tablet 0  . traMADol (ULTRAM) 50 MG tablet Take 1 tablet (50 mg total) by mouth every 8 (eight) hours as needed for severe pain. 15 tablet 0  . UNABLE TO FIND Take 2,000 mg by mouth 2 (two) times daily. Med Name: BEET ROOT     No current facility-administered medications on file prior to visit.     BP 136/82   Pulse 78   Temp 98.2 F (36.8 C) (Oral)   Ht 6\' 3"  (1.905 m)   Wt (!) 334 lb 4 oz (151.6 kg)   SpO2 98%   BMI 41.78 kg/m    Objective:   Physical Exam  Constitutional: He is oriented to person, place, and time. He appears well-nourished.  Cardiovascular: Normal rate.  Respiratory: Effort normal.  Neurological: He is alert and oriented to person,  place, and time.  Skin: Skin is warm and dry. There is erythema.     Moderate erythema without firmness to gluteal folds. No drainage. Non tender.            Assessment & Plan:

## 2018-02-17 ENCOUNTER — Ambulatory Visit: Payer: BLUE CROSS/BLUE SHIELD | Admitting: Primary Care

## 2018-02-17 NOTE — Telephone Encounter (Signed)
FYI, should be some additional paperwork coming.

## 2018-02-18 NOTE — Telephone Encounter (Signed)
Lowella Fairy I spoke with Steward Drone @ [employer]  She stated pt has paperwork with a list of questions they need filled out.   I spoke with pt he stated he did have paperwork but is waiting to get biopsy results from [facility].  He is aware that we need the paperwork  brenda gave him yesterday , we need to know exactly what they are asking for.

## 2018-02-18 NOTE — Telephone Encounter (Signed)
Pt dropped off paperwork to be filled out  In kates in box for review and signature

## 2018-02-18 NOTE — Telephone Encounter (Signed)
Completed paperwork and placed on Robin's desk. 

## 2018-02-19 NOTE — Telephone Encounter (Signed)
Paperwork faxed 2/3 Pt aware Copy for pt Copy for scan

## 2018-02-19 NOTE — Telephone Encounter (Signed)
Robin, can you take a look? 

## 2018-02-20 ENCOUNTER — Telehealth: Payer: Self-pay | Admitting: Primary Care

## 2018-02-20 NOTE — Telephone Encounter (Signed)
Completed and placed on [facility] desk. 

## 2018-02-20 NOTE — Telephone Encounter (Signed)
Short term disability income form In [name]'s in box for review and signature

## 2018-02-20 NOTE — Telephone Encounter (Signed)
Pt aware paperwork is complete but he will need to fill out his part  Copy for pt Copy for scan

## 2018-02-24 NOTE — Telephone Encounter (Signed)
Paperwork refaxed 2/5

## 2018-02-24 NOTE — Telephone Encounter (Signed)
Pt came and picked up paperwork he will turn in

## 2018-02-28 ENCOUNTER — Other Ambulatory Visit: Payer: Self-pay | Admitting: Primary Care

## 2018-02-28 ENCOUNTER — Telehealth: Payer: Self-pay | Admitting: Primary Care

## 2018-02-28 NOTE — Telephone Encounter (Signed)
[employer] faxed short term disability claim to be filled out.  In kate's in box

## 2018-02-28 NOTE — Telephone Encounter (Signed)
Completed and placed on [facility] desk. 

## 2018-03-04 NOTE — Telephone Encounter (Signed)
Paperwork faxed Copy for pt Copy for scan  Pt aware 

## 2018-03-06 ENCOUNTER — Telehealth: Payer: Self-pay | Admitting: Primary Care

## 2018-03-06 NOTE — Telephone Encounter (Signed)
Left message asking Dr Durene Cal office to call me.  Jae Dire wanted to know if Dr Adolphus Birchwood will take over pt FMLA/short term disability paperwork   If Dr Adolphus Birchwood will not do paperwork ask them to fax office notes .  Dr dasher phone # [phone]

## 2018-03-06 NOTE — Telephone Encounter (Signed)
Hi [name], FYI.

## 2018-03-06 NOTE — Telephone Encounter (Signed)
Left message with Dr Adolphus Birchwood office for someone to call me back to see if dr Dr Adolphus Birchwood will take over filling out fmla/short term disabiltiy paperwork.   Dr Adolphus Birchwood office # [phone]

## 2018-03-07 NOTE — Telephone Encounter (Signed)
Left message asking nurse from dr dasher office to call

## 2018-03-07 NOTE — Telephone Encounter (Signed)
Spoke with dr dasher @ [facility]  He stated he would be take over pt FMLA papework.  Jae Dire aware and she will let pt know to send any future paperwork to dr dasher

## 2018-04-04 ENCOUNTER — Other Ambulatory Visit: Payer: Self-pay | Admitting: Primary Care

## 2018-04-04 DIAGNOSIS — I10 Essential (primary) hypertension: Secondary | ICD-10-CM

## 2018-05-22 ENCOUNTER — Encounter: Payer: Self-pay | Admitting: Primary Care

## 2018-05-22 ENCOUNTER — Ambulatory Visit (INDEPENDENT_AMBULATORY_CARE_PROVIDER_SITE_OTHER): Payer: BLUE CROSS/BLUE SHIELD | Admitting: Primary Care

## 2018-05-22 DIAGNOSIS — L732 Hidradenitis suppurativa: Secondary | ICD-10-CM

## 2018-05-22 NOTE — Assessment & Plan Note (Signed)
Recurrent flares. Following with [facility] and will be seeing a specialist through [facility] on May 26th.   Given his recurrent flares and the potential need for frequent follow up visits with his new specialist, we will proceed with intermittent FMLA. He will contact his HR department to draw up paperwork. We will complete this once received.  Work note provided for the week of May 4th through 8th.

## 2018-05-22 NOTE — Progress Notes (Signed)
Subjective:    Patient ID: Cody BottomChristopher D Padilla, male    DOB: 07/25/1975, 43 y.o.   MRN: [id number]  HPI  Virtual Visit via Video Note  I connected with Cody Bottomhristopher D Padilla on 05/22/18 at 10:40 AM EDT by a video enabled telemedicine application and verified that I am speaking with the correct person using two identifiers.  Location: Patient: Home Provider: Office   I discussed the limitations of evaluation and management by telemedicine and the availability of in person appointments. The patient expressed understanding and agreed to proceed.  History of Present Illness:  [name] is a 43 year old male with a history of recurrent perirectal abscess who presents today requesting a work note. His FMLA has expired, he does not currently have intermittent FMLA.  He is currently following with [facility] who confirmed hidradenitis.  He [facility]. He is currently not being treated with medications.   He does continue to have "flare up's" intermittently which consists of moderate to severe pain, perirectal bleeding which prevents him from doing his required activities at work. His most recent flare began Saturday last weekend and has continued since. He's been wearing depends, taking Ibuprofen, and using warm compresses without much improvement. His last visit with [facility] was in March 2020, was provided with a course of Amoxil at that time.  He denies fevers.    Observations/Objective:  Alert and oriented. Appears well, not sickly. No distress. Speaking in complete sentences.   Assessment and Plan:  See problem based charting.  Follow Up Instructions:  Call the dermatologists office for any recommendations regarding your current symptoms.  Call your HR department to get intermittent FMLA paperwork sent to our office.  It was a pleasure to see you today! Mayra ReelKate Kahlea Cobert, NP-C    I discussed the  assessment and treatment plan with the patient. The patient was provided an opportunity to ask questions and all were answered. The patient agreed with the plan and demonstrated an understanding of the instructions.   The patient was advised to call back or seek an in-person evaluation if the symptoms worsen or if the condition fails to improve as anticipated.     Doreene NestKatherine K Ryka Beighley, NP    Review of Systems  Constitutional: Negative for fever.  Respiratory: Negative for shortness of breath.   Cardiovascular: Negative for chest pain.  Skin: Positive for wound.       Perirectal bleeding and pain.        Past Medical History:  Diagnosis Date  . Anorectal abscess   . Chickenpox 06/17/2017  . Erythrasma 2020  . Essential hypertension 04/25/2017  . Hyperlipidemia   . Hypertension   . Hypertriglyceridemia 04/25/2017     Social History   Socioeconomic History  . Marital status: Married    Spouse name: Not on file  . Number of children: Not on file  . Years of education: Not on file  . Highest education level: Not on file  Occupational History  . Not on file  Social Needs  . Financial resource strain: Not on file  . Food insecurity:    Worry: Not on file    Inability: Not on file  . Transportation needs:    Medical: Not on file    Non-medical: Not on file  Tobacco Use  . Smoking status: Never Smoker  . Smokeless tobacco: Never Used  Substance and Sexual Activity  . Alcohol use: Yes  .  Drug use: Never  . Sexual activity: Not on file  Lifestyle  . Physical activity:    Days per week: Not on file    Minutes per session: Not on file  . Stress: Not on file  Relationships  . Social connections:    Talks on phone: Not on file    Gets together: Not on file    Attends religious service: Not on file    Active member of club or organization: Not on file    Attends meetings of clubs or organizations: Not on file    Relationship status: Not on file  . Intimate partner  violence:    Fear of current or ex partner: Not on file    Emotionally abused: Not on file    Physically abused: Not on file    Forced sexual activity: Not on file  Other Topics Concern  . Not on file  Social History Narrative   Married.   No children.   Works as Medical illustrator.    Enjoys playing guitar, lifting weights, walking.    No past surgical history on file.  Family History  Problem Relation Age of Onset  . Arthritis Mother   . Diabetes Mother   . Heart disease Mother   . Hyperlipidemia Mother   . Hypertension Mother   . Cancer Father        lung  . COPD Father   . Arthritis Sister   . Arthritis Maternal Grandmother   . Diabetes Maternal Grandmother   . Heart disease Maternal Grandmother   . Hyperlipidemia Maternal Grandmother   . Hypertension Maternal Grandmother   . Arthritis Maternal Grandfather   . Heart attack Maternal Grandfather   . Heart disease Maternal Grandfather   . Hyperlipidemia Maternal Grandfather   . Hypertension Maternal Grandfather   . Arthritis Paternal Grandmother   . Melanoma Paternal Grandmother   . Hyperlipidemia Paternal Grandmother   . Hypertension Paternal Grandmother   . Arthritis Paternal Grandfather   . Diabetes Paternal Grandfather   . Heart attack Paternal Grandfather   . Hyperlipidemia Paternal Grandfather   . Hypertension Paternal Grandfather     No Known Allergies  Current Outpatient Medications on File Prior to Visit  Medication Sig Dispense Refill  . APPLE CIDER VINEGAR PO Take 450 mg by mouth daily.    . B Complex-Biotin-FA (B-COMPLEX PO) Take 35 mg by mouth daily.    Marland Kitchen doxycycline (VIBRA-TABS) 100 MG tablet Take 1 tablet (100 mg total) by mouth 2 (two) times daily. 20 tablet 0  . escitalopram (LEXAPRO) 10 MG tablet TAKE 1 TABLET BY MOUTH EVERY DAY 90 tablet 1  . famotidine (PEPCID) 20 MG tablet Take 20 mg by mouth daily.    . fenofibrate 160 MG tablet Take 1 tablet by mouth once daily for high cholesterol.  90 tablet 3  . losartan (COZAAR) 100 MG tablet TAKE 1 TABLET BY MOUTH EVERY DAY FOR BLOOD PRESSURE 90 tablet 1  . Misc Natural Products (RELAX & SLEEP PO) Take 325 mg by mouth daily.    Marland Kitchen sulfamethoxazole-trimethoprim (BACTRIM DS,SEPTRA DS) 800-160 MG tablet Take 1 tablet by mouth 2 (two) times daily. 14 tablet 0  . traMADol (ULTRAM) 50 MG tablet Take 1 tablet (50 mg total) by mouth every 8 (eight) hours as needed for severe pain. 15 tablet 0  . UNABLE TO FIND Take 2,000 mg by mouth 2 (two) times daily. Med Name: BEET ROOT     No current facility-administered medications on  file prior to visit.     There were no vitals taken for this visit.   Objective:   Physical Exam  Constitutional: He is oriented to person, place, and time. He appears well-nourished.  Respiratory: Effort normal.  Neurological: He is alert and oriented to person, place, and time.  Psychiatric: He has a normal mood and affect.           Assessment & Plan:

## 2018-05-22 NOTE — Patient Instructions (Signed)
Call the dermatologists office for any recommendations regarding your current symptoms.  Call your HR department to get intermittent FMLA paperwork sent to our office.  It was a pleasure to see you today! Mayra Reel, NP-C

## 2018-05-26 DIAGNOSIS — L03317 Cellulitis of buttock: Secondary | ICD-10-CM

## 2018-05-26 DIAGNOSIS — L0231 Cutaneous abscess of buttock: Secondary | ICD-10-CM

## 2018-05-26 MED ORDER — SULFAMETHOXAZOLE-TRIMETHOPRIM 800-160 MG PO TABS: 1.0000 | ORAL_TABLET | Freq: Two times a day (BID) | ORAL | 0 refills | Status: DC

## 2018-05-30 ENCOUNTER — Encounter: Payer: Self-pay | Admitting: Primary Care

## 2018-05-30 ENCOUNTER — Ambulatory Visit (INDEPENDENT_AMBULATORY_CARE_PROVIDER_SITE_OTHER): Payer: BLUE CROSS/BLUE SHIELD | Admitting: Primary Care

## 2018-05-30 VITALS — BP 140/90 | HR 84

## 2018-05-30 DIAGNOSIS — I10 Essential (primary) hypertension: Secondary | ICD-10-CM

## 2018-05-30 MED ORDER — HYDROCHLOROTHIAZIDE 12.5 MG PO TABS: 12.5000 mg | ORAL_TABLET | Freq: Every day | ORAL | 0 refills | Status: DC

## 2018-05-30 NOTE — Patient Instructions (Signed)
Continue losartan 100 mg tablets for blood pressure.  Start hydrochlorothiazide 12.5 mg tablets for blood pressure. Take once daily.  Start monitoring your blood pressure daily, around the same time of day, for the next 2-3 weeks.  Ensure that you have rested for 30 minutes prior to checking your blood pressure. Record your readings.  Please schedule a follow up visit for BP check for two weeks.  It was a pleasure to see you today! Mayra Reel, NP-C

## 2018-05-30 NOTE — Assessment & Plan Note (Signed)
Above goal today, also with borderline readings for the last several visits.  Continue losartan 100 mg, add in HCTZ 12.5 mg daily. We will plan to see him back for follow up in 2 weeks for BP check and BMP. He will schedule.

## 2018-05-30 NOTE — Progress Notes (Signed)
Subjective:    Patient ID: [name], male    DOB: Oct 18, 1975, 43 y.o.   MRN: [id number]  HPI  Virtual Visit via Video Note  I connected with [name] on 05/30/18 at  9:20 AM EDT by a video enabled telemedicine application and verified that I am speaking with the correct person using two identifiers.  Location: Patient: Home Provider: Office   I discussed the limitations of evaluation and management by telemedicine and the availability of in person appointments. The patient expressed understanding and agreed to proceed.  History of Present Illness:  [name] is a 43 year old male with a history of hidradenitis, hypertension, hypertriglyceridemia who presents today with a chief complaint of elevated blood pressure.  BP Readings from Last 3 Encounters:  05/30/18 140/90  02/14/18 136/82  11/15/17 136/80   He went back to work today, he's been out all week due to flare of his hidradenitis, checked in with the nurse who checked his BP which was 168/112. He was feeling anxious when going into work today. He does feel as though the Lexapro as helped some overall, but he does still get anxious and "worked up" easily.  He's rechecked his BP this morning which was 140/90. He's checking his BP infrequently when not in pain which is mid 130's/70's. He's compliant to his losartan 100 mg daily. He denies chest pain, dizziness, shortness of breath.   Observations/Objective:  Alert and oriented. Appears well, not sickly. No distress. Speaking in complete sentences.   Assessment and Plan:  See problem based charting.  Follow Up Instructions:  Continue losartan 100 mg tablets for blood pressure.  Start hydrochlorothiazide 12.5 mg tablets for blood pressure. Take once daily.  Start monitoring your blood pressure daily, around the same time of day, for the next 2-3 weeks.  Ensure that you have rested for 30 minutes prior to checking your blood pressure.  Record your readings.  Please schedule a follow up visit for BP check for two weeks.  It was a pleasure to see you today! Mayra Reel, NP-C    I discussed the assessment and treatment plan with the patient. The patient was provided an opportunity to ask questions and all were answered. The patient agreed with the plan and demonstrated an understanding of the instructions.   The patient was advised to call back or seek an in-person evaluation if the symptoms worsen or if the condition fails to improve as anticipated.     Doreene Nest, NP    Review of Systems  Eyes: Negative for visual disturbance.  Respiratory: Negative for shortness of breath.   Cardiovascular: Negative for chest pain.  Neurological: Negative for dizziness.       Past Medical History:  Diagnosis Date  . Anorectal abscess   . Chickenpox 06/17/2017  . Erythrasma 2020  . Essential hypertension 04/25/2017  . Hyperlipidemia   . Hypertension   . Hypertriglyceridemia 04/25/2017     Social History   Socioeconomic History  . Marital status: Married    Spouse name: Not on file  . Number of children: Not on file  . Years of education: Not on file  . Highest education level: Not on file  Occupational History  . Not on file  Social Needs  . Financial resource strain: Not on file  . Food insecurity:    Worry: Not on file    Inability: Not on file  . Transportation needs:    Medical: Not on file  Non-medical: Not on file  Tobacco Use  . Smoking status: Never Smoker  . Smokeless tobacco: Never Used  Substance and Sexual Activity  . Alcohol use: Yes  . Drug use: Never  . Sexual activity: Not on file  Lifestyle  . Physical activity:    Days per week: Not on file    Minutes per session: Not on file  . Stress: Not on file  Relationships  . Social connections:    Talks on phone: Not on file    Gets together: Not on file    Attends religious service: Not on file    Active member of club or  organization: Not on file    Attends meetings of clubs or organizations: Not on file    Relationship status: Not on file  . Intimate partner violence:    Fear of current or ex partner: Not on file    Emotionally abused: Not on file    Physically abused: Not on file    Forced sexual activity: Not on file  Other Topics Concern  . Not on file  Social History Narrative   Married.   No children.   Works as Medical illustratorengineering technician.    Enjoys playing guitar, lifting weights, walking.    No past surgical history on file.  Family History  Problem Relation Age of Onset  . Arthritis Mother   . Diabetes Mother   . Heart disease Mother   . Hyperlipidemia Mother   . Hypertension Mother   . Cancer Father        lung  . COPD Father   . Arthritis Sister   . Arthritis Maternal Grandmother   . Diabetes Maternal Grandmother   . Heart disease Maternal Grandmother   . Hyperlipidemia Maternal Grandmother   . Hypertension Maternal Grandmother   . Arthritis Maternal Grandfather   . Heart attack Maternal Grandfather   . Heart disease Maternal Grandfather   . Hyperlipidemia Maternal Grandfather   . Hypertension Maternal Grandfather   . Arthritis Paternal Grandmother   . Melanoma Paternal Grandmother   . Hyperlipidemia Paternal Grandmother   . Hypertension Paternal Grandmother   . Arthritis Paternal Grandfather   . Diabetes Paternal Grandfather   . Heart attack Paternal Grandfather   . Hyperlipidemia Paternal Grandfather   . Hypertension Paternal Grandfather     No Known Allergies  Current Outpatient Medications on File Prior to Visit  Medication Sig Dispense Refill  . APPLE CIDER VINEGAR PO Take 450 mg by mouth daily.    . B Complex-Biotin-FA (B-COMPLEX PO) Take 35 mg by mouth daily.    Marland Kitchen. escitalopram (LEXAPRO) 10 MG tablet TAKE 1 TABLET BY MOUTH EVERY DAY 90 tablet 1  . famotidine (PEPCID) 20 MG tablet Take 20 mg by mouth daily.    . fenofibrate 160 MG tablet Take 1 tablet by mouth  once daily for high cholesterol. 90 tablet 3  . losartan (COZAAR) 100 MG tablet TAKE 1 TABLET BY MOUTH EVERY DAY FOR BLOOD PRESSURE 90 tablet 1  . Misc Natural Products (RELAX & SLEEP PO) Take 325 mg by mouth daily.    Marland Kitchen. sulfamethoxazole-trimethoprim (BACTRIM DS) 800-160 MG tablet Take 1 tablet by mouth 2 (two) times daily. 14 tablet 0  . UNABLE TO FIND Take 2,000 mg by mouth 2 (two) times daily. Med Name: BEET ROOT     No current facility-administered medications on file prior to visit.     BP 140/90   Pulse 84    Objective:  Physical Exam  Constitutional: He is oriented to person, place, and time. He appears well-nourished.  Respiratory: Effort normal.  Neurological: He is alert and oriented to person, place, and time.  Psychiatric: He has a normal mood and affect.           Assessment & Plan:

## 2018-06-13 ENCOUNTER — Ambulatory Visit (INDEPENDENT_AMBULATORY_CARE_PROVIDER_SITE_OTHER): Payer: BLUE CROSS/BLUE SHIELD | Admitting: Primary Care

## 2018-06-13 ENCOUNTER — Encounter: Payer: Self-pay | Admitting: Primary Care

## 2018-06-13 VITALS — BP 135/80 | Wt 330.0 lb

## 2018-06-13 DIAGNOSIS — I10 Essential (primary) hypertension: Secondary | ICD-10-CM

## 2018-06-13 DIAGNOSIS — L732 Hidradenitis suppurativa: Secondary | ICD-10-CM

## 2018-06-13 NOTE — Patient Instructions (Signed)
Schedule a lab only appointment for repeat kidney function.   Continue to monitor your blood pressure and report readings at or above 135/90.  Continue hydrochlorothiazide 12.5 mg and losartan 100 mg for now.  It was a pleasure to see you today! Mayra Reel, NP-C

## 2018-06-13 NOTE — Progress Notes (Signed)
Subjective:    Patient ID: [name], male    DOB: [birth year], 43 y.o.   MRN: [id number]  HPI  Virtual Visit via Video Note  I connected with [name] on 06/13/18 at  8:40 AM EDT by a video enabled telemedicine application and verified that I am speaking with the correct person using two identifiers.  Location: Patient: Work Provider: Office   I discussed the limitations of evaluation and management by telemedicine and the availability of in person appointments. The patient expressed understanding and agreed to proceed.  We attempted a video visit but his employer would not allow. We had to complete our visit via phone which lasted 5 min and 30 sec.  History of Present Illness:  [name] is a 43 year old male who presents today for follow up of hypertension.   He is currently managed on HCTZ 12.5 mg and losartan 100 mg. He was last evaluated by me virtually on 05/30/18 with borderline readings over the last several visits so we added HCTZ 12.5 mg. He was asked to monitor BP and follow up today.  Since his last visit he's checking his BP at home and is getting readings of 130's/80's, 150/90 at [facility] several days ago. He is feeling better overall, denies dizziness, chest pain, shortness of breath. He is working on his diet and plans on exercising.   BP Readings from Last 3 Encounters:  06/13/18 135/80  05/30/18 140/90  02/14/18 136/82   He was evaluated at [facility] for his recurrent rectal skin infections, hidradenitis was confirmed. He is on treatment with Doxycycline and several topical agents and is doing better. He has recently cut back on sugar intake given that it feeds the hidradenitis.    Observations/Objective:  Alert and oriented. No distress. Speaking in complete sentences.   Assessment and Plan:  Improved slightly with addition of HCTZ 12.5 mg. Compliant to losartan. Check BMP first. If stable then continue current regimen for now.  He would like to continue to work on weight loss through his diet. Discussed that if his BP remained at or above 135/90 on a consistent basis that we would need to increase his dose of HCTZ. He verbalized understanding.  Follow Up Instructions:  Schedule a lab only appointment for repeat kidney function.   Continue to monitor your blood pressure and report readings at or above 135/90.  Continue hydrochlorothiazide 12.5 mg and losartan 100 mg for now.  It was a pleasure to see you today! Mayra Reel, NP-C    I discussed the assessment and treatment plan with the patient. The patient was provided an opportunity to ask questions and all were answered. The patient agreed with the plan and demonstrated an understanding of the instructions.   The patient was advised to call back or seek an in-person evaluation if the symptoms worsen or if the condition fails to improve as anticipated.    Doreene Nest, NP    Review of Systems  Eyes: Negative for visual disturbance.  Respiratory: Negative for shortness of breath.   Cardiovascular: Negative for chest pain.  Neurological: Negative for dizziness and headaches.       Past Medical History:  Diagnosis Date  . Anorectal abscess   . Chickenpox 06/17/2017  . Erythrasma 2020  . Essential hypertension 04/25/2017  . Hyperlipidemia   . Hypertension   . Hypertriglyceridemia 04/25/2017     Social History   Socioeconomic History  . Marital status: Married    Spouse  name: Not on file  . Number of children: Not on file  . Years of education: Not on file  . Highest education level: Not on file  Occupational History  . Not on file  Social Needs  . Financial resource strain: Not on file  . Food insecurity:    Worry: Not on file    Inability: Not on file  . Transportation needs:    Medical: Not on file    Non-medical: Not on file  Tobacco Use  . Smoking status: Never Smoker  . Smokeless tobacco: Never Used  Substance and Sexual  Activity  . Alcohol use: Yes  . Drug use: Never  . Sexual activity: Not on file  Lifestyle  . Physical activity:    Days per week: Not on file    Minutes per session: Not on file  . Stress: Not on file  Relationships  . Social connections:    Talks on phone: Not on file    Gets together: Not on file    Attends religious service: Not on file    Active member of club or organization: Not on file    Attends meetings of clubs or organizations: Not on file    Relationship status: Not on file  . Intimate partner violence:    Fear of current or ex partner: Not on file    Emotionally abused: Not on file    Physically abused: Not on file    Forced sexual activity: Not on file  Other Topics Concern  . Not on file  Social History Narrative   Married.   No children.   Works as Medical illustrator.    Enjoys playing guitar, lifting weights, walking.    No past surgical history on file.  Family History  Problem Relation Age of Onset  . Arthritis Mother   . Diabetes Mother   . Heart disease Mother   . Hyperlipidemia Mother   . Hypertension Mother   . Cancer Father        lung  . COPD Father   . Arthritis Sister   . Arthritis Maternal Grandmother   . Diabetes Maternal Grandmother   . Heart disease Maternal Grandmother   . Hyperlipidemia Maternal Grandmother   . Hypertension Maternal Grandmother   . Arthritis Maternal Grandfather   . Heart attack Maternal Grandfather   . Heart disease Maternal Grandfather   . Hyperlipidemia Maternal Grandfather   . Hypertension Maternal Grandfather   . Arthritis Paternal Grandmother   . Melanoma Paternal Grandmother   . Hyperlipidemia Paternal Grandmother   . Hypertension Paternal Grandmother   . Arthritis Paternal Grandfather   . Diabetes Paternal Grandfather   . Heart attack Paternal Grandfather   . Hyperlipidemia Paternal Grandfather   . Hypertension Paternal Grandfather     No Known Allergies  Current Outpatient Medications on  File Prior to Visit  Medication Sig Dispense Refill  . APPLE CIDER VINEGAR PO Take 450 mg by mouth daily.    . B Complex-Biotin-FA (B-COMPLEX PO) Take 35 mg by mouth daily.    Marland Kitchen escitalopram (LEXAPRO) 10 MG tablet TAKE 1 TABLET BY MOUTH EVERY DAY 90 tablet 1  . famotidine (PEPCID) 20 MG tablet Take 20 mg by mouth daily.    . fenofibrate 160 MG tablet Take 1 tablet by mouth once daily for high cholesterol. 90 tablet 3  . hydrochlorothiazide (HYDRODIURIL) 12.5 MG tablet Take 1 tablet (12.5 mg total) by mouth daily. For blood pressure. 90 tablet 0  .  losartan (COZAAR) 100 MG tablet TAKE 1 TABLET BY MOUTH EVERY DAY FOR BLOOD PRESSURE 90 tablet 1  . Misc Natural Products (RELAX & SLEEP PO) Take 325 mg by mouth daily.    Marland Kitchen. UNABLE TO FIND Take 2,000 mg by mouth 2 (two) times daily. Med Name: BEET ROOT     No current facility-administered medications on file prior to visit.     BP 135/80   Wt (!) 330 lb (149.7 kg)   BMI 41.25 kg/m    Objective:   Physical Exam  Constitutional: He is oriented to person, place, and time.  Respiratory: Effort normal.  Neurological: He is alert and oriented to person, place, and time.  Psychiatric: He has a normal mood and affect.           Assessment & Plan:

## 2018-06-13 NOTE — Assessment & Plan Note (Signed)
Improving, following with [facility]. Continue current regimen.

## 2018-06-13 NOTE — Assessment & Plan Note (Signed)
Improved slightly with addition of HCTZ 12.5 mg. Compliant to losartan. Check BMP first. If stable then continue current regimen for now. He would like to continue to work on weight loss through his diet. Discussed that if his BP remained at or above 135/90 on a consistent basis that we would need to increase his dose of HCTZ. He verbalized understanding.

## 2018-06-24 ENCOUNTER — Other Ambulatory Visit: Payer: Self-pay | Admitting: Primary Care

## 2018-06-24 DIAGNOSIS — F411 Generalized anxiety disorder: Secondary | ICD-10-CM

## 2018-06-26 ENCOUNTER — Other Ambulatory Visit: Payer: Self-pay

## 2018-06-26 ENCOUNTER — Other Ambulatory Visit (INDEPENDENT_AMBULATORY_CARE_PROVIDER_SITE_OTHER): Payer: BC Managed Care – PPO

## 2018-06-26 DIAGNOSIS — I10 Essential (primary) hypertension: Secondary | ICD-10-CM

## 2018-06-26 LAB — BASIC METABOLIC PANEL
BUN: 17 mg/dL (ref 6–23)
CO2: 26 mEq/L (ref 19–32)
Calcium: 9.6 mg/dL (ref 8.4–10.5)
Chloride: 101 mEq/L (ref 96–112)
Creatinine, Ser: 0.89 mg/dL (ref 0.40–1.50)
GFR: 93.66 mL/min (ref >60.00)
Glucose, Bld: 91 mg/dL (ref 70–99)
Potassium: 4 mEq/L (ref 3.5–5.1)
Sodium: 137 mEq/L (ref 135–145)

## 2018-06-30 ENCOUNTER — Other Ambulatory Visit: Payer: Self-pay | Admitting: Primary Care

## 2018-06-30 DIAGNOSIS — E781 Pure hyperglyceridemia: Secondary | ICD-10-CM

## 2018-07-02 NOTE — Telephone Encounter (Signed)
Patient needs CPE with fasting labs, can be virtual if he prefers. He will need this for any additional refills.

## 2018-07-02 NOTE — Telephone Encounter (Signed)
Last prescribed on 04/25/2017. Last appointment on 06/13/2018. No future appointment

## 2018-07-04 NOTE — Telephone Encounter (Signed)
Message left for patient to return my call.  

## 2018-07-26 ENCOUNTER — Other Ambulatory Visit: Payer: Self-pay | Admitting: Primary Care

## 2018-07-26 DIAGNOSIS — E781 Pure hyperglyceridemia: Secondary | ICD-10-CM

## 2018-08-20 ENCOUNTER — Other Ambulatory Visit: Payer: Self-pay | Admitting: Primary Care

## 2018-08-20 DIAGNOSIS — E781 Pure hyperglyceridemia: Secondary | ICD-10-CM

## 2018-08-21 ENCOUNTER — Other Ambulatory Visit: Payer: Self-pay | Admitting: Primary Care

## 2018-08-21 DIAGNOSIS — I10 Essential (primary) hypertension: Secondary | ICD-10-CM

## 2018-09-19 ENCOUNTER — Telehealth: Payer: Self-pay | Admitting: Primary Care

## 2018-09-19 DIAGNOSIS — E781 Pure hyperglyceridemia: Secondary | ICD-10-CM

## 2018-09-19 MED ORDER — FENOFIBRATE 160 MG PO TABS: ORAL_TABLET | ORAL | 0 refills | Status: DC

## 2018-09-19 NOTE — Telephone Encounter (Signed)
Patient received a letter that he was due for his cpx.  Patient scheduled cpx on 10/24/18.  Patient needs a refill on his cholesterol medication sent to [facility] [facility] in [place]. Patient has about 4 days left.

## 2018-09-19 NOTE — Telephone Encounter (Signed)
Patient has been notified

## 2018-09-19 NOTE — Telephone Encounter (Signed)
Refill sent as requested as appointment have been scheduled.

## 2018-10-03 ENCOUNTER — Other Ambulatory Visit: Payer: Self-pay

## 2018-10-03 ENCOUNTER — Other Ambulatory Visit: Payer: Self-pay | Admitting: Primary Care

## 2018-10-03 DIAGNOSIS — Z20822 Contact with and (suspected) exposure to covid-19: Secondary | ICD-10-CM

## 2018-10-03 DIAGNOSIS — Z20828 Contact with and (suspected) exposure to other viral communicable diseases: Secondary | ICD-10-CM

## 2018-10-04 ENCOUNTER — Encounter: Payer: Self-pay | Admitting: Primary Care

## 2018-10-04 LAB — NOVEL CORONAVIRUS, NAA: SARS-CoV-2, NAA: NOT DETECTED

## 2018-10-24 ENCOUNTER — Ambulatory Visit (INDEPENDENT_AMBULATORY_CARE_PROVIDER_SITE_OTHER): Payer: BC Managed Care – PPO | Admitting: Primary Care

## 2018-10-24 ENCOUNTER — Other Ambulatory Visit: Payer: Self-pay

## 2018-10-24 VITALS — BP 142/78 | HR 101 | Temp 97.8°F | Ht 75.0 in | Wt 332.2 lb

## 2018-10-24 DIAGNOSIS — E781 Pure hyperglyceridemia: Secondary | ICD-10-CM | POA: Diagnosis not present

## 2018-10-24 DIAGNOSIS — L732 Hidradenitis suppurativa: Secondary | ICD-10-CM | POA: Diagnosis not present

## 2018-10-24 DIAGNOSIS — Z0001 Encounter for general adult medical examination with abnormal findings: Secondary | ICD-10-CM | POA: Insufficient documentation

## 2018-10-24 DIAGNOSIS — Z0000 Encounter for general adult medical examination without abnormal findings: Secondary | ICD-10-CM

## 2018-10-24 DIAGNOSIS — F411 Generalized anxiety disorder: Secondary | ICD-10-CM

## 2018-10-24 DIAGNOSIS — I10 Essential (primary) hypertension: Secondary | ICD-10-CM | POA: Diagnosis not present

## 2018-10-24 LAB — COMPREHENSIVE METABOLIC PANEL
ALT: 19 U/L (ref 0–53)
AST: 20 U/L (ref 0–37)
Albumin: 4.3 g/dL (ref 3.5–5.2)
Alkaline Phosphatase: 44 U/L (ref 39–117)
BUN: 19 mg/dL (ref 6–23)
CO2: 25 mEq/L (ref 19–32)
Calcium: 9.8 mg/dL (ref 8.4–10.5)
Chloride: 100 mEq/L (ref 96–112)
Creatinine, Ser: 1.1 mg/dL (ref 0.40–1.50)
GFR: 73.23 mL/min (ref >60.00)
Glucose, Bld: 97 mg/dL (ref 70–99)
Potassium: 4.2 mEq/L (ref 3.5–5.1)
Sodium: 137 mEq/L (ref 135–145)
Total Bilirubin: 0.5 mg/dL (ref 0.2–1.2)
Total Protein: 7.9 g/dL (ref 6.0–8.3)

## 2018-10-24 LAB — LDL CHOLESTEROL, DIRECT: Direct LDL: 158 mg/dL

## 2018-10-24 LAB — LIPID PANEL
Cholesterol: 219 mg/dL — ABNORMAL HIGH (ref 0–200)
HDL: 37.7 mg/dL — ABNORMAL LOW (ref >39.00)
Total CHOL/HDL Ratio: 6
Triglycerides: 470 mg/dL — ABNORMAL HIGH (ref 0.0–149.0)

## 2018-10-24 MED ORDER — HYDROCHLOROTHIAZIDE 25 MG PO TABS: 25.0000 mg | ORAL_TABLET | Freq: Every day | ORAL | 3 refills | Status: DC

## 2018-10-24 MED ORDER — LOSARTAN POTASSIUM 100 MG PO TABS: 100.0000 mg | ORAL_TABLET | Freq: Every day | ORAL | 3 refills | Status: DC

## 2018-10-24 NOTE — Assessment & Plan Note (Signed)
Compliant to fenofibrate, repeat lipids pending. 

## 2018-10-24 NOTE — Progress Notes (Signed)
Subjective:    Patient ID: [name], male    DOB: 07/26/1975, 43 y.o.   MRN: [id number]  HPI  [name] is a 43 year old male who presents today for complete physical.  Immunizations: -Tetanus: Completed in 2016 -Influenza: Completed this season   Diet: He endorses a healthier diet. He is eating grilled protein, vegetables, little starch/carbs. Desserts infrequently. Drinking water, black tea unsweet.  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 Dental exam: No recent exam  BP Readings from Last 3 Encounters:  10/24/18 (!) 142/78  06/13/18 135/80  05/30/18 140/90   He is checking his BP at home which is running mid 130's/80's.   Review of Systems  Constitutional: Negative for unexpected weight change.  HENT: Negative for rhinorrhea.   Respiratory: Negative for cough and shortness of breath.   Cardiovascular: Negative for chest pain.  Gastrointestinal: Negative for constipation and diarrhea.  Genitourinary: Negative for difficulty urinating.  Musculoskeletal: Negative for arthralgias and myalgias.  Skin: Negative for rash.       Hidradenitis much improved  Allergic/Immunologic: Negative for environmental allergies.  Neurological: Negative for dizziness, numbness and headaches.  Psychiatric/Behavioral:       Feels well managed on Lexapro       Past Medical History:  Diagnosis Date  . Anorectal abscess   . Chickenpox 06/17/2017  . Erythrasma 2020  . Essential hypertension 04/25/2017  . Hyperlipidemia   . Hypertension   . Hypertriglyceridemia 04/25/2017     Social History   Socioeconomic History  . Marital status: Married    Spouse name: Not on file  . Number of children: Not on file  . Years of education: Not on file  . Highest education level: Not on file  Occupational History  . Not on file  Social Needs  . Financial resource strain: Not on file  . Food insecurity    Worry: Not on file    Inability: Not  on file  . Transportation needs    Medical: Not on file    Non-medical: Not on file  Tobacco Use  . Smoking status: Never Smoker  . Smokeless tobacco: Never Used  Substance and Sexual Activity  . Alcohol use: Yes  . Drug use: Never  . Sexual activity: Not on file  Lifestyle  . Physical activity    Days per week: Not on file    Minutes per session: Not on file  . Stress: Not on file  Relationships  . Social Musician on phone: Not on file    Gets together: Not on file    Attends religious service: Not on file    Active member of club or organization: Not on file    Attends meetings of clubs or organizations: Not on file    Relationship status: Not on file  . Intimate partner violence    Fear of current or ex partner: Not on file    Emotionally abused: Not on file    Physically abused: Not on file    Forced sexual activity: Not on file  Other Topics Concern  . Not on file  Social History Narrative   Married.   No children.   Works as Medical illustrator.    Enjoys playing guitar, lifting weights, walking.    No past surgical history on file.  Family History  Problem Relation Age of Onset  . Arthritis Mother   . Diabetes Mother   .  Heart disease Mother   . Hyperlipidemia Mother   . Hypertension Mother   . Cancer Father        lung  . COPD Father   . Arthritis Sister   . Arthritis Maternal Grandmother   . Diabetes Maternal Grandmother   . Heart disease Maternal Grandmother   . Hyperlipidemia Maternal Grandmother   . Hypertension Maternal Grandmother   . Arthritis Maternal Grandfather   . Heart attack Maternal Grandfather   . Heart disease Maternal Grandfather   . Hyperlipidemia Maternal Grandfather   . Hypertension Maternal Grandfather   . Arthritis Paternal Grandmother   . Melanoma Paternal Grandmother   . Hyperlipidemia Paternal Grandmother   . Hypertension Paternal Grandmother   . Arthritis Paternal Grandfather   . Diabetes Paternal  Grandfather   . Heart attack Paternal Grandfather   . Hyperlipidemia Paternal Grandfather   . Hypertension Paternal Grandfather     No Known Allergies  Current Outpatient Medications on File Prior to Visit  Medication Sig Dispense Refill  . B Complex-Biotin-FA (B-COMPLEX PO) Take 35 mg by mouth daily.    Marland Kitchen escitalopram (LEXAPRO) 10 MG tablet TAKE 1 TABLET BY MOUTH EVERY DAY 90 tablet 1  . famotidine (PEPCID) 20 MG tablet Take 20 mg by mouth daily.    . fenofibrate 160 MG tablet Take 1 tablet by mouth once daily for high cholesterol. 90 tablet 0   No current facility-administered medications on file prior to visit.     BP (!) 142/78   Pulse (!) 101   Temp 97.8 F (36.6 C) (Temporal)   Ht 6\' 3"  (1.905 m)   Wt (!) 332 lb 4 oz (150.7 kg)   SpO2 98%   BMI 41.53 kg/m    Objective:   Physical Exam  Constitutional: He is oriented to person, place, and time. He appears well-nourished.  HENT:  Right Ear: Tympanic membrane and ear canal normal.  Left Ear: Tympanic membrane and ear canal normal.  Mouth/Throat: Oropharynx is clear and moist.  Eyes: Pupils are equal, round, and reactive to light. EOM are normal.  Neck: Neck supple.  Cardiovascular: Normal rate and regular rhythm.  Respiratory: Effort normal and breath sounds normal.  GI: Soft. Bowel sounds are normal. There is no abdominal tenderness.  Musculoskeletal: Normal range of motion.  Neurological: He is alert and oriented to person, place, and time.  Skin: Skin is warm and dry.  Psychiatric: He has a normal mood and affect.           Assessment & Plan:

## 2018-10-24 NOTE — Assessment & Plan Note (Signed)
Doing well on lexapro 10 mg. Continue same.

## 2018-10-24 NOTE — Patient Instructions (Addendum)
Stop by the lab prior to leaving today. I will notify you of your results once received.   Start exercising. You should be getting 150 minutes of moderate intensity exercise weekly.  Continue to work on a healthy diet. Ensure you are consuming 64 ounces of water daily.  Please message me with both medications you are taking through [facility].  Send me some updated blood pressure readings in 2 weeks.  It was a pleasure to see you today!   Preventive Care 42-42 Years Old, Male Preventive care refers to lifestyle choices and visits with your health care provider that can promote health and wellness. This includes:  A yearly physical exam. This is also called an annual well check.  Regular dental and eye exams.  Immunizations.  Screening for certain conditions.  Healthy lifestyle choices, such as eating a healthy diet, getting regular exercise, not using drugs or products that contain nicotine and tobacco, and limiting alcohol use. What can I expect for my preventive care visit? Physical exam Your health care provider will check:  Height and weight. These may be used to calculate body mass index (BMI), which is a measurement that tells if you are at a healthy weight.  Heart rate and blood pressure.  Your skin for abnormal spots. Counseling Your health care provider may ask you questions about:  Alcohol, tobacco, and drug use.  Emotional well-being.  Home and relationship well-being.  Sexual activity.  Eating habits.  Work and work Statistician. What immunizations do I need?  Influenza (flu) vaccine  This is recommended every year. Tetanus, diphtheria, and pertussis (Tdap) vaccine  You may need a Td booster every 10 years. Varicella (chickenpox) vaccine  You may need this vaccine if you have not already been vaccinated. Zoster (shingles) vaccine  You may need this after age 67. Measles, mumps, and rubella (MMR) vaccine  You may need at least one dose of MMR if you  were born in 1957 or later. You may also need a second dose. Pneumococcal conjugate (PCV13) vaccine  You may need this if you have certain conditions and were not previously vaccinated. Pneumococcal polysaccharide (PPSV23) vaccine  You may need one or two doses if you smoke cigarettes or if you have certain conditions. Meningococcal conjugate (MenACWY) vaccine  You may need this if you have certain conditions. Hepatitis A vaccine  You may need this if you have certain conditions or if you travel or work in places where you may be exposed to hepatitis A. Hepatitis B vaccine  You may need this if you have certain conditions or if you travel or work in places where you may be exposed to hepatitis B. Haemophilus influenzae type b (Hib) vaccine  You may need this if you have certain risk factors. Human papillomavirus (HPV) vaccine  If recommended by your health care provider, you may need three doses over 6 months. You may receive vaccines as individual doses or as more than one vaccine together in one shot (combination vaccines). Talk with your health care provider about the risks and benefits of combination vaccines. What tests do I need? Blood tests  Lipid and cholesterol levels. These may be checked every 5 years, or more frequently if you are over 73 years old.  Hepatitis C test.  Hepatitis B test. Screening  Lung cancer screening. You may have this screening every year starting at age 81 if you have a 30-pack-year history of smoking and currently smoke or have quit within the past 15 years.  Prostate cancer screening. Recommendations will vary depending on your family history and other risks.  Colorectal cancer screening. All adults should have this screening starting at age 35 and continuing until age 76. Your health care provider may recommend screening at age 55 if you are at increased risk. You will have tests every 1-10 years, depending on your results and the type of  screening test.  Diabetes screening. This is done by checking your blood sugar (glucose) after you have not eaten for a while (fasting). You may have this done every 1-3 years.  Sexually transmitted disease (STD) testing. Follow these instructions at home: Eating and drinking  Eat a diet that includes fresh fruits and vegetables, whole grains, lean protein, and low-fat dairy products.  Take vitamin and mineral supplements as recommended by your health care provider.  Do not drink alcohol if your health care provider tells you not to drink.  If you drink alcohol: ? Limit how much you have to 0-2 drinks a day. ? Be aware of how much alcohol is in your drink. In the [place] one drink equals one 12 oz bottle of beer (355 mL), one 5 oz glass of wine (148 mL), or one 1 oz glass of hard liquor (44 mL). Lifestyle  Take daily care of your teeth and gums.  Stay active. Exercise for at least 30 minutes on 5 or more days each week.  Do not use any products that contain nicotine or tobacco, such as cigarettes, e-cigarettes, and chewing tobacco. If you need help quitting, ask your health care provider.  If you are sexually active, practice safe sex. Use a condom or other form of protection to prevent STIs (sexually transmitted infections).  Talk with your health care provider about taking a low-dose aspirin every day starting at age 86. What's next?  Go to your health care provider once a year for a well check visit.  Ask your health care provider how often you should have your eyes and teeth checked.  Stay up to date on all vaccines. This information is not intended to replace advice given to you by your health care provider. Make sure you discuss any questions you have with your health care provider. Document Released: 01/28/2015 Document Revised: 12/26/2017 Document Reviewed: 12/26/2017 Elsevier Patient Education  2020 [employer].

## 2018-10-24 NOTE — Assessment & Plan Note (Signed)
Immunizations UTD.  Discussed the importance of a healthy diet and regular exercise in order for weight loss, and to reduce the risk of any potential medical problems.  Exam today stable. Labs pending. 

## 2018-10-24 NOTE — Assessment & Plan Note (Signed)
Significantly improved with topical treatment and oral treatment. Following through [facility]. He will send [place] a message with his updated meds.

## 2018-10-24 NOTE — Assessment & Plan Note (Addendum)
Above goal in the office, also borderline/above goal at home. Increase HCTZ to 25 mg. Continue Losartan 100 mg.  CMP pending. He will send updated readings via my chart in 2 weeks.

## 2018-12-20 ENCOUNTER — Other Ambulatory Visit: Payer: Self-pay | Admitting: Primary Care

## 2018-12-20 DIAGNOSIS — F411 Generalized anxiety disorder: Secondary | ICD-10-CM

## 2018-12-20 DIAGNOSIS — E781 Pure hyperglyceridemia: Secondary | ICD-10-CM

## 2019-01-23 IMAGING — CT CT ABD-PELV W/ CM
2 of 5 series · 15 of 46 positions shown, 17 images · IV contrast (APPLIED)
Comparison: None.

CLINICAL DATA: Cellulitis of the buttocks. Acute generalized
abdominal pain.

EXAM:
CT ABDOMEN AND PELVIS WITH CONTRAST
TECHNIQUE: Multidetector CT imaging of the abdomen and pelvis was performed
using the standard protocol following bolus administration of
intravenous contrast.
CONTRAST:  100mL 2IMI11-0WW IOPAMIDOL (2IMI11-0WW) INJECTION 61%

[Series 2: axial st · axial · 0.86mm/px · z∈[-1254,-684]mm · 12 of 130 slices shown, 14 images]
[im 8/130  soft-tissue]
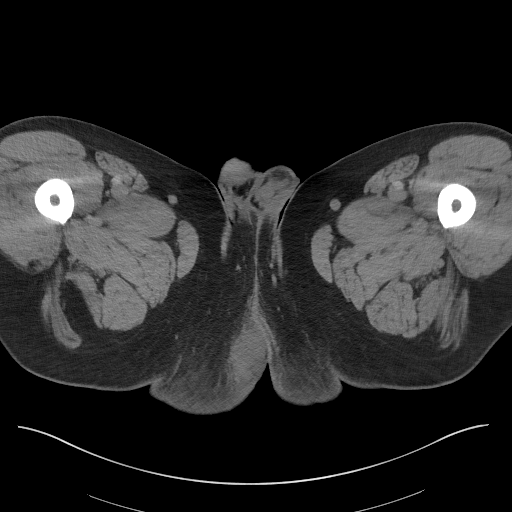
[im 8/130  bone]
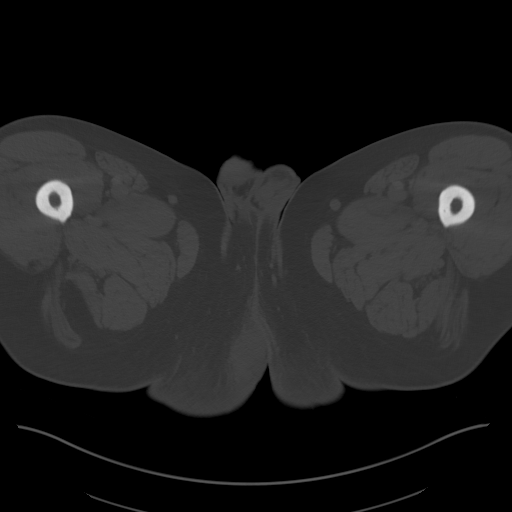
[im 22/130  soft-tissue]
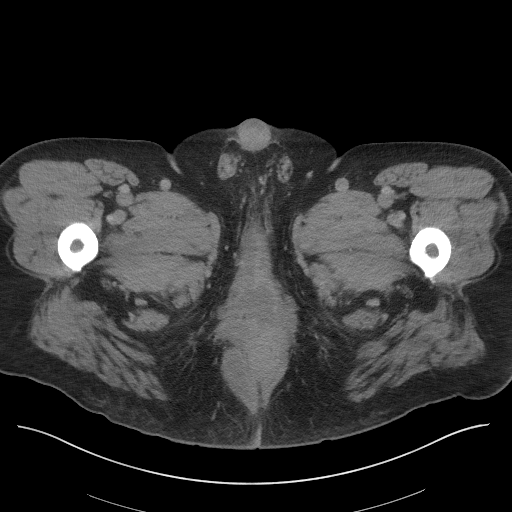
[im 29/130  soft-tissue]
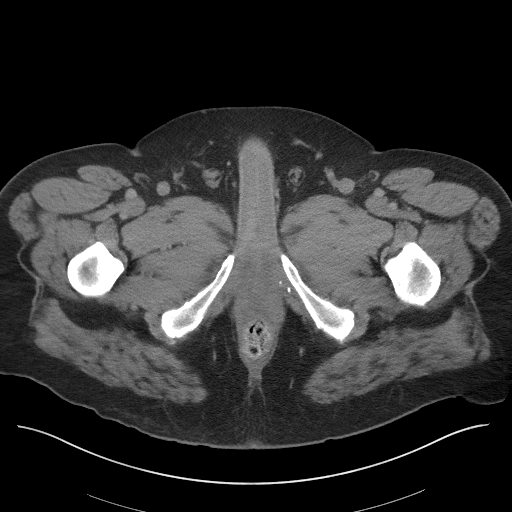
[im 36/130  soft-tissue]
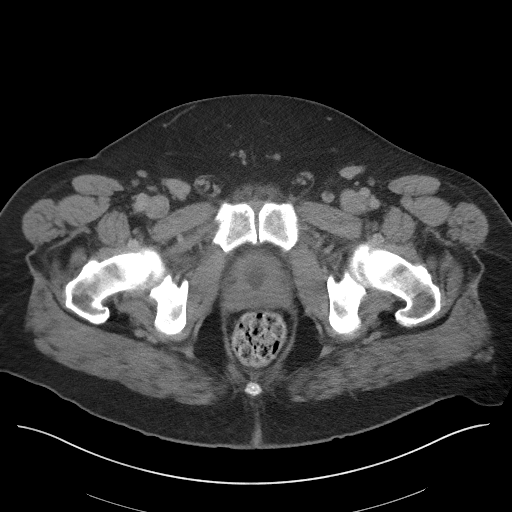
[im 51/130  soft-tissue]
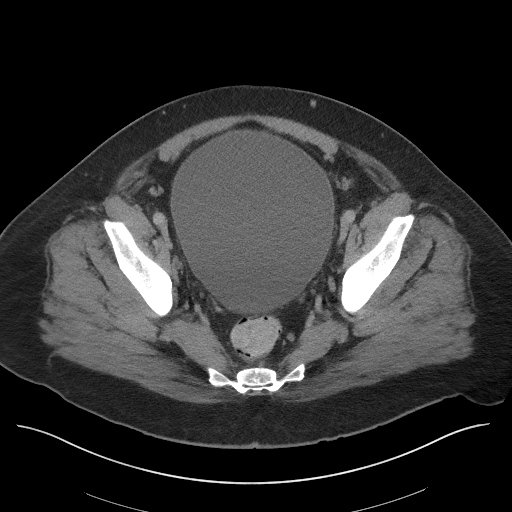
[im 58/130  soft-tissue]
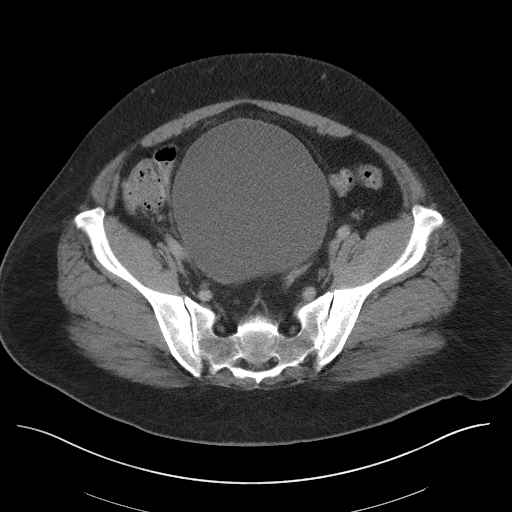
[im 72/130  soft-tissue]
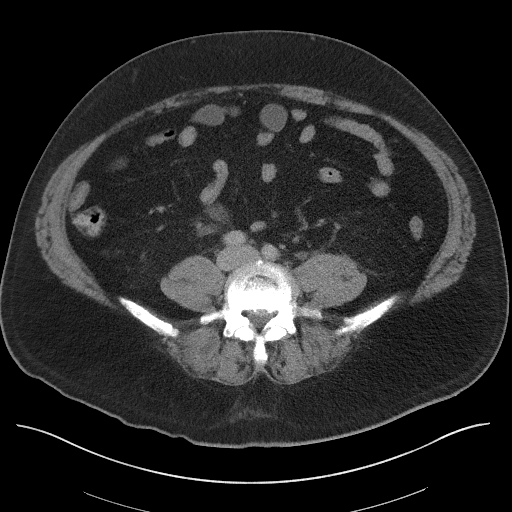
[im 79/130  soft-tissue]
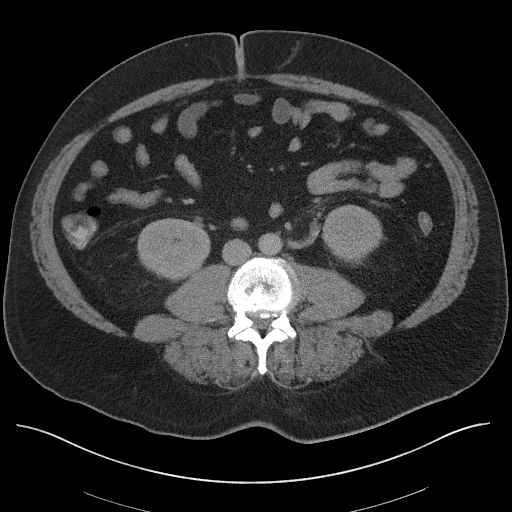
[im 94/130  soft-tissue]
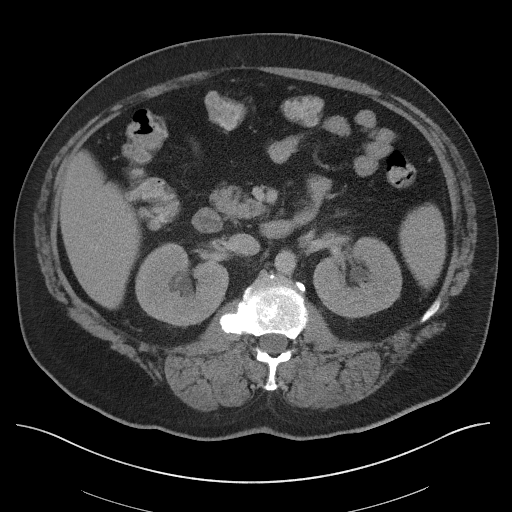
[im 94/130  bone]
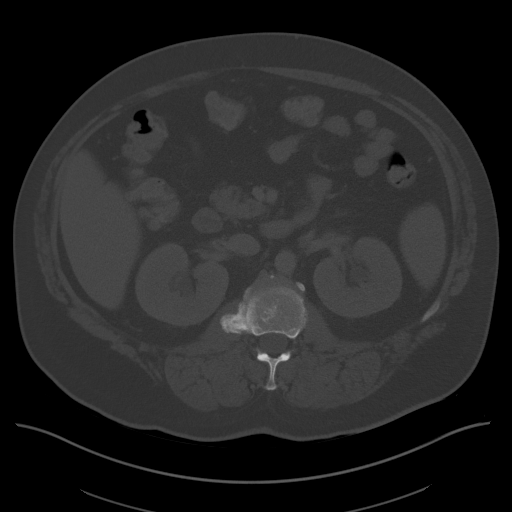
[im 101/130  soft-tissue]
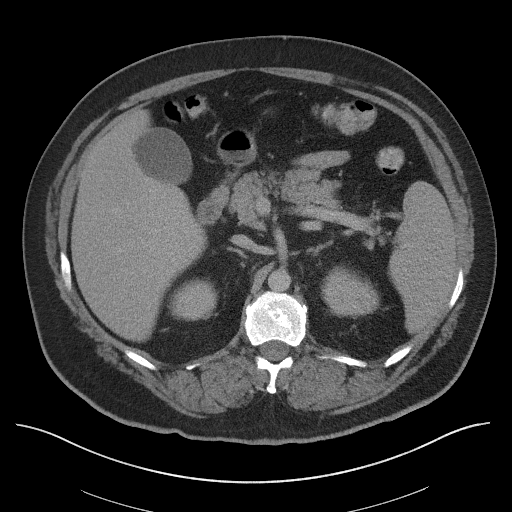
[im 108/130  soft-tissue]
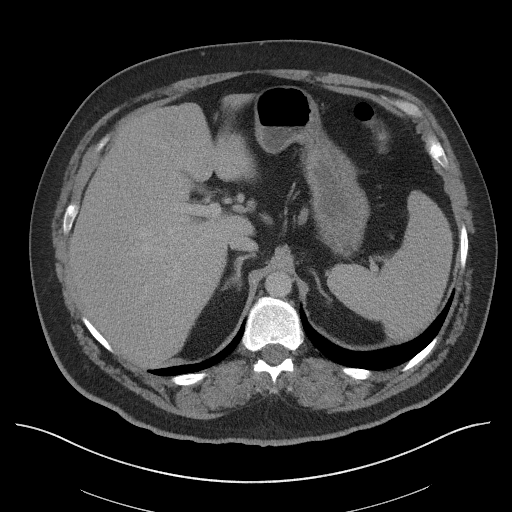
[im 122/130  soft-tissue]
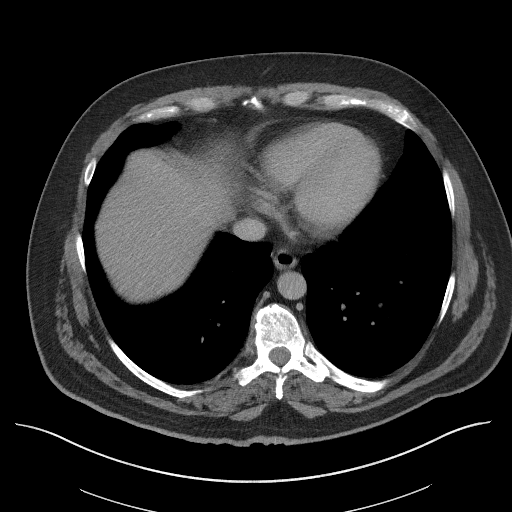

[Series 5: coronal st · coronal · 0.88mm/px · 3 of 102 slices shown]
[im 34/102  soft-tissue]
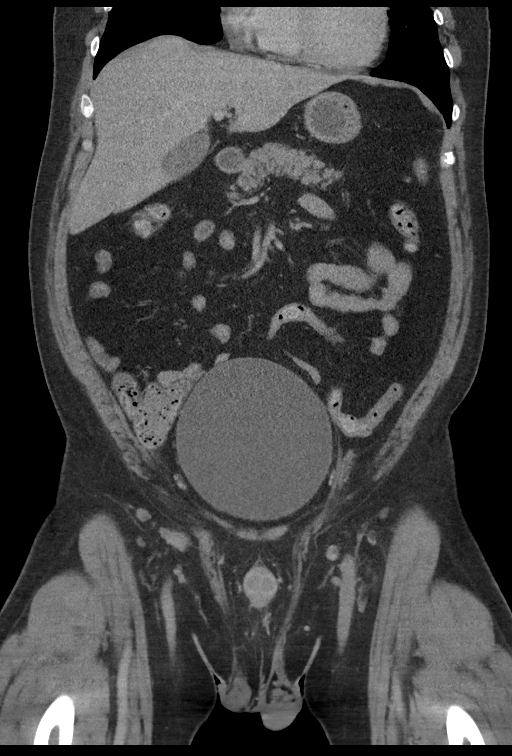
[im 45/102  soft-tissue]
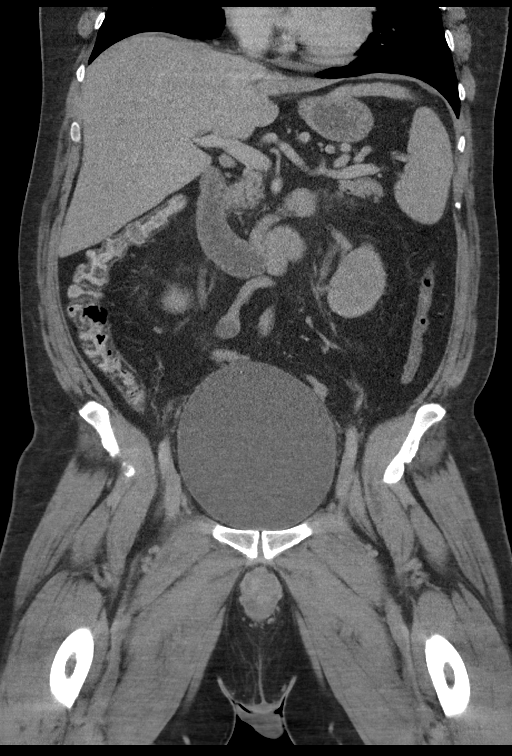
[im 57/102  soft-tissue]
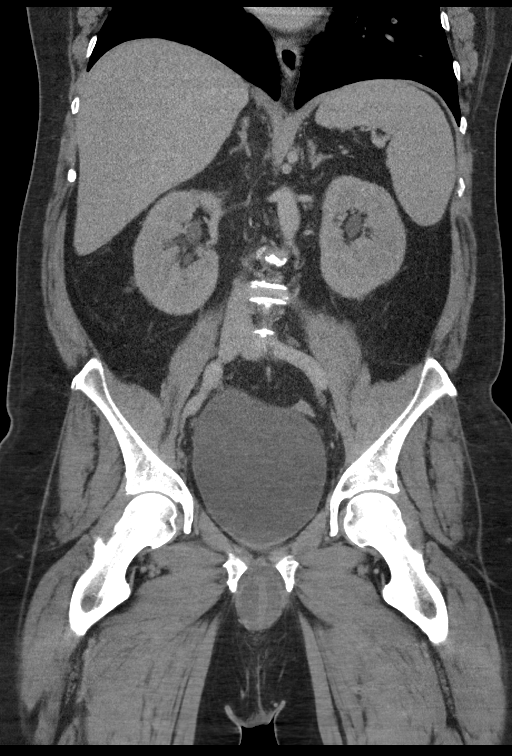

[15 of 46 positions shown; findings below may reference images not displayed]

FINDINGS: Lower chest: No acute abnormality.

Hepatobiliary: No focal liver abnormality is seen. No gallstones,
gallbladder wall thickening, or biliary dilatation.

Pancreas: Unremarkable. No pancreatic ductal dilatation or
surrounding inflammatory changes.

Spleen: Normal in size without focal abnormality.

Adrenals/Urinary Tract: Adrenal glands appear normal. No renal or
ureteral calculi are noted. Mild bilateral hydroureteronephrosis is
noted secondary to distended urinary bladder. This is concerning for
bladder outlet obstruction.

Stomach/Bowel: The stomach appears normal. The appendix appears
normal. There is no evidence of bowel obstruction.

Vascular/Lymphatic: No significant vascular findings are present. No
enlarged abdominal or pelvic lymph nodes.

Reproductive: Prostate is unremarkable.

Other: There is a multiloculated fluid collection in the perineal
region that measures 16.8 x 9.9 x 7.2 cm. This fluid collection
surrounds the anus and extends anteriorly and superiorly into the
base of the penis. This is consistent with abscess. This is
concerning for developing Denkovski gangrene.

Musculoskeletal: No acute or significant osseous findings.
IMPRESSION: 16.8 x 9.9 x 7.2 cm multiloculated abscess is noted in the perineal
region which surrounds the anus and extends anteriorly and
superiorly and involves the base of the penis. This is concerning
for developing Denkovski gangrene. Critical Value/emergent results
were called by telephone at the time of interpretation on 06/13/2017
at [DATE] to Dr. DIALLO COLCHADO , who verbally acknowledged
these results.

Mild bilateral hydroureteronephrosis is noted secondary to urinary
bladder distention, which may be due to bladder outlet obstruction
secondary to the previously described abscess.

## 2019-04-14 ENCOUNTER — Other Ambulatory Visit: Payer: Self-pay

## 2019-04-14 ENCOUNTER — Encounter: Payer: Self-pay | Admitting: Internal Medicine

## 2019-04-14 ENCOUNTER — Ambulatory Visit (INDEPENDENT_AMBULATORY_CARE_PROVIDER_SITE_OTHER): Payer: BC Managed Care – PPO | Admitting: Internal Medicine

## 2019-04-14 ENCOUNTER — Ambulatory Visit
Admission: RE | Admit: 2019-04-14 | Discharge: 2019-04-14 | Disposition: A | Discharge status: Home / Self Care | Payer: BC Managed Care – PPO | Source: Ambulatory Visit | Attending: Internal Medicine | Admitting: Internal Medicine

## 2019-04-14 ENCOUNTER — Telehealth: Payer: Self-pay

## 2019-04-14 ENCOUNTER — Other Ambulatory Visit: Payer: Self-pay | Admitting: Internal Medicine

## 2019-04-14 VITALS — BP 136/92 | HR 96 | Temp 98.5°F | Wt 336.0 lb

## 2019-04-14 DIAGNOSIS — I861 Scrotal varices: Secondary | ICD-10-CM

## 2019-04-14 DIAGNOSIS — N451 Epididymitis: Secondary | ICD-10-CM

## 2019-04-14 DIAGNOSIS — N5082 Scrotal pain: Secondary | ICD-10-CM | POA: Diagnosis not present

## 2019-04-14 DIAGNOSIS — R1031 Right lower quadrant pain: Secondary | ICD-10-CM | POA: Insufficient documentation

## 2019-04-14 DIAGNOSIS — N492 Inflammatory disorders of scrotum: Secondary | ICD-10-CM

## 2019-04-14 DIAGNOSIS — K4030 Unilateral inguinal hernia, with obstruction, without gangrene, not specified as recurrent: Secondary | ICD-10-CM | POA: Insufficient documentation

## 2019-04-14 LAB — CBC
HCT: 41.5 % (ref 39.0–52.0)
Hemoglobin: 14.3 g/dL (ref 13.0–17.0)
MCHC: 34.4 g/dL (ref 30.0–36.0)
MCV: 92.5 fl (ref 78.0–100.0)
Platelets: 303 10*3/uL (ref 150.0–400.0)
RBC: 4.48 Mil/uL (ref 4.22–5.81)
RDW: 13.5 % (ref 11.5–15.5)
WBC: 7.8 10*3/uL (ref 4.0–10.5)

## 2019-04-14 LAB — COMPREHENSIVE METABOLIC PANEL
ALT: 18 U/L (ref 0–53)
AST: 17 U/L (ref 0–37)
Albumin: 4.1 g/dL (ref 3.5–5.2)
Alkaline Phosphatase: 47 U/L (ref 39–117)
BUN: 19 mg/dL (ref 6–23)
CO2: 29 mEq/L (ref 19–32)
Calcium: 9.5 mg/dL (ref 8.4–10.5)
Chloride: 96 mEq/L (ref 96–112)
Creatinine, Ser: 0.82 mg/dL (ref 0.40–1.50)
GFR: 102.56 mL/min (ref >60.00)
Glucose, Bld: 89 mg/dL (ref 70–99)
Potassium: 4.2 mEq/L (ref 3.5–5.1)
Sodium: 134 mEq/L — ABNORMAL LOW (ref 135–145)
Total Bilirubin: 0.5 mg/dL (ref 0.2–1.2)
Total Protein: 8.3 g/dL (ref 6.0–8.3)

## 2019-04-14 LAB — POC URINALSYSI DIPSTICK (AUTOMATED)
Bilirubin, UA: NEGATIVE
Glucose, UA: NEGATIVE
Ketones, UA: NEGATIVE
Leukocytes, UA: NEGATIVE
Nitrite, UA: NEGATIVE
Protein, UA: NEGATIVE
Spec Grav, UA: 1.015 (ref 1.010–1.025)
Urobilinogen, UA: 0.2 E.U./dL
pH, UA: 6 (ref 5.0–8.0)

## 2019-04-14 MED ORDER — SULFAMETHOXAZOLE-TRIMETHOPRIM 800-160 MG PO TABS: 1.0000 | ORAL_TABLET | Freq: Two times a day (BID) | ORAL | 0 refills | Status: DC

## 2019-04-14 NOTE — Progress Notes (Signed)
Subjective:    Patient ID: [name], male    DOB: 31-Jul-1975, 44 y.o.   MRN: [id number]  HPI  Pt presents to the clinic today with c/o RLQ abdominal pain. This started 1 week ago. He describes the pain as dull, achy, but the pain can be sharp, shooting into the right groin and right testicle. The pain is worse with bending forward or with lifting. The area is tender to touch. He denies nausea, vomiting, diarrhea or blood in the stool. He reports some dysuria but denies urinary frequency, urgency, or blood in his urine. He denies penile discharge, lesion or ulceration. He reports some right testicular swelling, pain and redness. He has not taken any medication OTC for this.   Review of Systems      Past Medical History:  Diagnosis Date  . Anorectal abscess   . Chickenpox 06/17/2017  . Erythrasma 2020  . Essential hypertension 04/25/2017  . Hyperlipidemia   . Hypertension   . Hypertriglyceridemia 04/25/2017    Current Outpatient Medications  Medication Sig Dispense Refill  . B Complex-Biotin-FA (B-COMPLEX PO) Take 35 mg by mouth daily.    Marland Kitchen escitalopram (LEXAPRO) 10 MG tablet TAKE 1 TABLET BY MOUTH EVERY DAY 90 tablet 2  . famotidine (PEPCID) 20 MG tablet Take 20 mg by mouth daily.    . fenofibrate 160 MG tablet TAKE 1 TABLET BY MOUTH ONCE DAILY FOR HIGH CHOLESTEROL. 90 tablet 2  . hydrochlorothiazide (HYDRODIURIL) 25 MG tablet Take 1 tablet (25 mg total) by mouth daily. For blood pressure. 90 tablet 3  . losartan (COZAAR) 100 MG tablet Take 1 tablet (100 mg total) by mouth daily. For blood pressure. 90 tablet 3   No current facility-administered medications for this visit.    No Known Allergies  Family History  Problem Relation Age of Onset  . Arthritis Mother   . Diabetes Mother   . Heart disease Mother   . Hyperlipidemia Mother   . Hypertension Mother   . Cancer Father        lung  . COPD Father   . Arthritis Sister   . Arthritis Maternal Grandmother   .  Diabetes Maternal Grandmother   . Heart disease Maternal Grandmother   . Hyperlipidemia Maternal Grandmother   . Hypertension Maternal Grandmother   . Arthritis Maternal Grandfather   . Heart attack Maternal Grandfather   . Heart disease Maternal Grandfather   . Hyperlipidemia Maternal Grandfather   . Hypertension Maternal Grandfather   . Arthritis Paternal Grandmother   . Melanoma Paternal Grandmother   . Hyperlipidemia Paternal Grandmother   . Hypertension Paternal Grandmother   . Arthritis Paternal Grandfather   . Diabetes Paternal Grandfather   . Heart attack Paternal Grandfather   . Hyperlipidemia Paternal Grandfather   . Hypertension Paternal Grandfather     Social History   Socioeconomic History  . Marital status: Married    Spouse name: Not on file  . Number of children: Not on file  . Years of education: Not on file  . Highest education level: Not on file  Occupational History  . Not on file  Tobacco Use  . Smoking status: Never Smoker  . Smokeless tobacco: Never Used  Substance and Sexual Activity  . Alcohol use: Yes  . Drug use: Never  . Sexual activity: Not on file  Other Topics Concern  . Not on file  Social History Narrative   Married.   No children.   Works as Engineer, production  technician.    Enjoys playing guitar, lifting weights, walking.   Social Determinants of Health   Financial Resource Strain:   . Difficulty of Paying Living Expenses:   Food Insecurity:   . Worried About Programme researcher, broadcasting/film/video in the Last Year:   . Barista in the Last Year:   Transportation Needs:   . Freight forwarder (Medical):   Marland Kitchen Lack of Transportation (Non-Medical):   Physical Activity:   . Days of Exercise per Week:   . Minutes of Exercise per Session:   Stress:   . Feeling of Stress :   Social Connections:   . Frequency of Communication with Friends and Family:   . Frequency of Social Gatherings with Friends and Family:   . Attends Religious Services:     . Active Member of Clubs or Organizations:   . Attends Banker Meetings:   Marland Kitchen Marital Status:   Intimate Partner Violence:   . Fear of Current or Ex-Partner:   . Emotionally Abused:   Marland Kitchen Physically Abused:   . Sexually Abused:      Constitutional: Denies fever, malaise, fatigue, headache or abrupt weight changes.  Respiratory: Denies difficulty breathing, shortness of breath, cough or sputum production.   Cardiovascular: Denies chest pain, chest tightness, palpitations or swelling in the hands or feet.  Gastrointestinal: Pt reports abdominal pain. Denies bloating, constipation, diarrhea or blood in the stool.  GU: Pt reports dysuria. Denies urgency, frequency,  burning sensation, blood in urine, odor or discharge. Skin: Denies redness, rashes, lesions or ulcercations.    No other specific complaints in a complete review of systems (except as listed in HPI above).  Objective:   Physical Exam  BP (!) 136/92   Pulse 96   Temp 98.5 F (36.9 C) (Temporal)   Wt (!) 336 lb (152.4 kg)   SpO2 98%   BMI 42.00 kg/m   Wt Readings from Last 3 Encounters:  10/24/18 (!) 332 lb 4 oz (150.7 kg)  06/13/18 (!) 330 lb (149.7 kg)  02/14/18 (!) 334 lb 4 oz (151.6 kg)    General: Appears his stated age, obese, in NAD. Skin: Redness, thickening noted of the right scrotal sac. Cardiovascular: Normal rate and rhythm. S1,S2 noted.  No murmur, rubs or gallops noted. Pulmonary/Chest: Normal effort and positive vesicular breath sounds. No respiratory distress. No wheezes, rales or ronchi noted.  Abdomen: Soft and tender in the RLQ, not at McBurney's point. Hyperactive bowel sounds. No distention or masses noted.  GU:  Normal male anatomy. Penis inverted. No direct on indirect inguinal hernia noted. Pain with palpation of the right testicle.  Neurological: Alert and oriented.    BMET    Component Value Date/Time   NA 137 10/24/2018 0900   K 4.2 10/24/2018 0900   CL 100 10/24/2018  0900   CO2 25 10/24/2018 0900   GLUCOSE 97 10/24/2018 0900   BUN 19 10/24/2018 0900   CREATININE 1.10 10/24/2018 0900   CALCIUM 9.8 10/24/2018 0900   GFRNONAA >60 06/13/2017 0959   GFRAA >60 06/13/2017 0959    Lipid Panel     Component Value Date/Time   CHOL 219 (H) 10/24/2018 0900   TRIG (H) 10/24/2018 0900    470.0 Triglyceride is over 400; calculations on Lipids are invalid.   HDL 37.70 (L) 10/24/2018 0900   CHOLHDL 6 10/24/2018 0900    CBC    Component Value Date/Time   WBC 7.1 11/15/2017 1051   RBC  4.65 11/15/2017 1051   HGB 14.7 11/15/2017 1051   HCT 42.5 11/15/2017 1051   PLT 275.0 11/15/2017 1051   MCV 91.6 11/15/2017 1051   MCH 31.5 06/13/2017 0959   MCHC 34.6 11/15/2017 1051   RDW 14.0 11/15/2017 1051   LYMPHSABS 1.9 11/15/2017 1051   MONOABS 0.7 11/15/2017 1051   EOSABS 0.1 11/15/2017 1051   BASOSABS 0.0 11/15/2017 1051    Hgb A1C Lab Results  Component Value Date   HGBA1C 5.4 11/15/2017          Assessment & Plan:   RLQ Abdominal Pain, Scrotal Pain:  Urinalysis: 1+ blood Will send urine culture Will obtain ultrasound scrotum CBC and CMET today RX for Septra DS 1 tab BID x 14 days Offered referral to urology- prefers to follow up with PCP ER precautions discussed  Return precautions discussed, Follow up with PCP on Monday Nicki Reaper, NP This visit occurred during the SARS-CoV-2 public health emergency.  Safety protocols were in place, including screening questions prior to the visit, additional usage of staff PPE, and extensive cleaning of exam room while observing appropriate contact time as indicated for disinfecting solutions.

## 2019-04-14 NOTE — Telephone Encounter (Signed)
[facility] Night - Client Nonclinical Telephone Record AccessNurse Client [facility] Night - Client Client Site  [facility] [place] - Night Physician Vernona Rieger - NP Contact Type Call Who Is Calling Patient / Member / Family / Caregiver Caller Name Ronald Pippins Phone Number [phone] Patient Name [name] Patient DOB [birth year] Call Type Message Only Information Provided Reason for Call Request to Schedule Office Appointment Initial Comment Caller wants to schedule an appointment for her husband. He has rectal bleeding. Caller declined triage Additional Comment Office hours were provided. Disp. Time Disposition Final User 04/14/2019 7:20:27 AM General Information Provided Yes Leotis Shames Call Closed By: Leotis Shames Transaction Date/Time: 04/14/2019 7:18:24 AM (ET)

## 2019-04-14 NOTE — Telephone Encounter (Signed)
Diane with [place] radiology called report on [place] of scrotum. Pt is not waiting. Report is in epic and copy taken to Nicki Reaper NP.

## 2019-04-14 NOTE — Telephone Encounter (Signed)
Pt notified by provider

## 2019-04-14 NOTE — Telephone Encounter (Signed)
Pt already has appt scheduled today at 9:15 with Pamala Hurry NP.

## 2019-04-14 NOTE — Patient Instructions (Signed)
Epididymitis  Epididymitis is swelling (inflammation) or infection of the epididymis. The epididymis is a cord-like structure that is located along the top and back part of the testicle. It collects and stores sperm from the testicle. This condition can also cause pain and swelling of the testicle and scrotum. Symptoms usually start suddenly (acute epididymitis). Sometimes epididymitis starts gradually and lasts for a while (chronic epididymitis). This type may be harder to treat. What are the causes? In men ages 20-40, this condition is usually caused by a bacterial infection or a sexually transmitted disease (STD), such as:  Gonorrhea.  Chlamydia. In men 40 and older who do not have anal sex, this condition is usually caused by bacteria from a blockage or from abnormalities in the urinary system. These can result from:  Having a tube placed into the bladder (urinary catheter).  Having an enlarged or inflamed prostate gland.  Having recently had urinary tract surgery.  Having a problem with a backward flow of urine (retrograde). In men who have a condition that weakens the body's defense system (immune system), such as HIV, this condition can be caused by:  Other bacteria, including tuberculosis and syphilis.  Viruses.  Fungi. Sometimes this condition occurs without infection. This may happen because of trauma or repetitive activities such as sports. What increases the risk? You are more likely to develop this condition if you have:  Unprotected sex with more than one partner.  Anal sex.  Recently had surgery.  A urinary catheter.  Urinary problems.  A suppressed immune system. What are the signs or symptoms? This condition usually begins suddenly with chills, fever, and pain behind the scrotum and in the testicle. Other symptoms include:  Swelling of the scrotum, testicle, or both.  Pain when ejaculating or urinating.  Pain in the back or  abdomen.  Nausea.  Itching and discharge from the penis.  A frequent need to pass urine.  Redness, increased warmth, and tenderness of the scrotum. How is this diagnosed? Your health care provider can diagnose this condition based on your symptoms and medical history. Your health care provider will also do a physical exam to ask about your symptoms and check your scrotum and testicle for swelling, pain, and redness. You may also have other tests, including:  Examination of discharge from the penis.  Urine tests for infections, such as STDs.  Ultrasound test for blood flow and inflammation. Your health care provider may test you for other STDs, including HIV. How is this treated? Treatment for this condition depends on the cause. If your condition is caused by a bacterial infection, oral antibiotic medicine may be prescribed. If the bacterial infection has spread to your blood, you may need to receive IV antibiotics. For both bacterial and nonbacterial epididymitis, you may be treated with:  Rest.  Elevation of the scrotum.  Pain medicines.  Anti-inflammatory medicines. Surgery may be needed to treat:  Bacterial epididymitis that causes pus to build up in the scrotum (abscess).  Chronic epididymitis that has not responded to other treatments. Follow these instructions at home: Medicines  Take over-the-counter and prescription medicines only as told by your health care provider.  If you were prescribed an antibiotic medicine, take it as told by your health care provider. Do not stop taking the antibiotic even if your condition improves. Sexual activity  If your epididymitis was caused by an STD, avoid sexual activity until your treatment is complete.  Inform your sexual partner or partners if you test positive for   an STD. They may need to be treated. Do not engage in sexual activity with your partner or partners until their treatment is completed. Managing pain and  swelling   If directed, elevate your scrotum and apply ice. ? Put ice in a plastic bag. ? Place a small towel or pillow between your legs. ? Rest your scrotum on the pillow or towel. ? Place another towel between your skin and the plastic bag. ? Leave the ice on for 20 minutes, 2-3 times a day.  Try taking a sitz bath to help with discomfort. This is a warm water bath that is taken while you are sitting down. The water should only come up to your hips and should cover your buttocks. Do this 3-4 times per day or as told by your health care provider.  Keep your scrotum elevated and supported while resting. Ask your health care provider if you should wear a scrotal support, such as a jockstrap. Wear it as told by your health care provider. General instructions  Return to your normal activities as told by your health care provider. Ask your health care provider what activities are safe for you.  Drink enough fluid to keep your urine pale yellow.  Keep all follow-up visits as told by your health care provider. This is important. Contact a health care provider if:  You have a fever.  Your pain medicine is not helping.  Your pain is getting worse.  Your symptoms do not improve within 3 days. Summary  Epididymitis is swelling (inflammation) or infection of the epididymis. This condition can also cause pain and swelling of the testicle and scrotum.  Treatment for this condition depends on the cause. If your condition is caused by a bacterial infection, oral antibiotic medicine may be prescribed.  Inform your sexual partner or partners if you test positive for an STD. They may need to be treated. Do not engage in sexual activity with your partner or partners until their treatment is completed.  Contact a health care provider if your symptoms do not improve within 3 days. This information is not intended to replace advice given to you by your health care provider. Make sure you discuss any  questions you have with your health care provider. Document Revised: 11/04/2017 Document Reviewed: 11/05/2017 Elsevier Patient Education  2020 [employer]  

## 2019-04-21 ENCOUNTER — Other Ambulatory Visit: Payer: Self-pay

## 2019-04-21 ENCOUNTER — Encounter: Payer: Self-pay | Admitting: Primary Care

## 2019-04-21 ENCOUNTER — Ambulatory Visit: Payer: BC Managed Care – PPO | Admitting: Primary Care

## 2019-04-21 DIAGNOSIS — N492 Inflammatory disorders of scrotum: Secondary | ICD-10-CM | POA: Diagnosis not present

## 2019-04-21 HISTORY — DX: Inflammatory disorders of scrotum: N49.2

## 2019-04-21 NOTE — Patient Instructions (Signed)
Continue the antibiotics as prescribed.   Please update me if your symptoms return.  It was a pleasure to see you today!

## 2019-04-21 NOTE — Assessment & Plan Note (Signed)
Diagnosed via scrotal ultrasound last week, doing much better today with antibiotic use. Continue oral antibiotics for another week. Return precautions provided.

## 2019-04-21 NOTE — Progress Notes (Signed)
Subjective:    Patient ID: [name], male    DOB: 1975/05/13, 44 y.o.   MRN: [id number]  HPI  This visit occurred during the SARS-CoV-2 public health emergency.  Safety protocols were in place, including screening questions prior to the visit, additional usage of staff PPE, and extensive cleaning of exam room while observing appropriate contact time as indicated for disinfecting solutions.   [name] is a 44 year old male with a history of hidradenitis, hypertriglyceridemia, GAD, hypertension who presents today for follow up.  He was last evaluated on 04/14/19 by Webb Silversmith for a one week history of RLQ abdominal pain, right groin pain, right testicular swelling. UA with 1+ blood. Labs without leukocytosis. Urine culture not sent. Scrotal ultrasound with epididymitis, cellulitis. He was treated with Bactrim DS tablets for 14 day course due to presumed scrotal cellulitis.   Since his last visit he's feeling better. He's noticed improvement in his pain, swelling, and redness. He is compliant to his Bactrim DS tablets. Urination is much better. He denies hematuria, difficulty urinating, dysuria.   He continues to follow with dermatology through [facility] for hidradenitis, he has an antibiotic on hand to use for flares if needed, has not had to use this as of yet.  BP Readings from Last 3 Encounters:  04/21/19 136/86  04/14/19 (!) 136/92  10/24/18 (!) 142/78     Review of Systems  Gastrointestinal: Negative for abdominal pain and nausea.  Genitourinary: Negative for difficulty urinating, dysuria, hematuria, scrotal swelling and testicular pain.       Past Medical History:  Diagnosis Date  . Anorectal abscess   . Chickenpox 06/17/2017  . Erythrasma 2020  . Essential hypertension 04/25/2017  . Hyperlipidemia   . Hypertension   . Hypertriglyceridemia 04/25/2017     Social History   Socioeconomic History  . Marital status: Married    Spouse name: Not on file  .  Number of children: Not on file  . Years of education: Not on file  . Highest education level: Not on file  Occupational History  . Not on file  Tobacco Use  . Smoking status: Never Smoker  . Smokeless tobacco: Never Used  Substance and Sexual Activity  . Alcohol use: Yes  . Drug use: Never  . Sexual activity: Not on file  Other Topics Concern  . Not on file  Social History Narrative   Married.   No children.   Works as Investment banker, operational.    Enjoys playing guitar, lifting weights, walking.   Social Determinants of Health   Financial Resource Strain:   . Difficulty of Paying Living Expenses:   Food Insecurity:   . Worried About Charity fundraiser in the Last Year:   . Arboriculturist in the Last Year:   Transportation Needs:   . Film/video editor (Medical):   Marland Kitchen Lack of Transportation (Non-Medical):   Physical Activity:   . Days of Exercise per Week:   . Minutes of Exercise per Session:   Stress:   . Feeling of Stress :   Social Connections:   . Frequency of Communication with Friends and Family:   . Frequency of Social Gatherings with Friends and Family:   . Attends Religious Services:   . Active Member of Clubs or Organizations:   . Attends Archivist Meetings:   Marland Kitchen Marital Status:   Intimate Partner Violence:   . Fear of Current or Ex-Partner:   . Emotionally Abused:   .  Physically Abused:   . Sexually Abused:     No past surgical history on file.  Family History  Problem Relation Age of Onset  . Arthritis Mother   . Diabetes Mother   . Heart disease Mother   . Hyperlipidemia Mother   . Hypertension Mother   . Cancer Father        lung  . COPD Father   . Arthritis Sister   . Arthritis Maternal Grandmother   . Diabetes Maternal Grandmother   . Heart disease Maternal Grandmother   . Hyperlipidemia Maternal Grandmother   . Hypertension Maternal Grandmother   . Arthritis Maternal Grandfather   . Heart attack Maternal Grandfather     . Heart disease Maternal Grandfather   . Hyperlipidemia Maternal Grandfather   . Hypertension Maternal Grandfather   . Arthritis Paternal Grandmother   . Melanoma Paternal Grandmother   . Hyperlipidemia Paternal Grandmother   . Hypertension Paternal Grandmother   . Arthritis Paternal Grandfather   . Diabetes Paternal Grandfather   . Heart attack Paternal Grandfather   . Hyperlipidemia Paternal Grandfather   . Hypertension Paternal Grandfather     No Known Allergies  Current Outpatient Medications on File Prior to Visit  Medication Sig Dispense Refill  . B Complex-Biotin-FA (B-COMPLEX PO) Take 35 mg by mouth daily.    Marland Kitchen escitalopram (LEXAPRO) 10 MG tablet TAKE 1 TABLET BY MOUTH EVERY DAY 90 tablet 2  . famotidine (PEPCID) 20 MG tablet Take 20 mg by mouth daily.    . fenofibrate 160 MG tablet TAKE 1 TABLET BY MOUTH ONCE DAILY FOR HIGH CHOLESTEROL. 90 tablet 2  . hydrochlorothiazide (HYDRODIURIL) 25 MG tablet Take 1 tablet (25 mg total) by mouth daily. For blood pressure. 90 tablet 3  . losartan (COZAAR) 100 MG tablet Take 1 tablet (100 mg total) by mouth daily. For blood pressure. 90 tablet 3  . sulfamethoxazole-trimethoprim (BACTRIM DS) 800-160 MG tablet Take 1 tablet by mouth 2 (two) times daily. 28 tablet 0   No current facility-administered medications on file prior to visit.    BP 136/86   Pulse (!) 101   Temp (!) 96.8 F (36 C) (Temporal)   Ht 6\' 3"  (1.905 m)   Wt (!) 339 lb 8 oz (154 kg)   SpO2 98%   BMI 42.43 kg/m    Objective:   Physical Exam  Constitutional: He appears well-nourished.  Cardiovascular: Normal rate.  Respiratory: Effort normal.  GI: Soft. There is no abdominal tenderness.  Skin: Skin is warm and dry.           Assessment & Plan:

## 2019-05-14 ENCOUNTER — Telehealth (INDEPENDENT_AMBULATORY_CARE_PROVIDER_SITE_OTHER): Payer: BC Managed Care – PPO | Admitting: Family Medicine

## 2019-05-14 ENCOUNTER — Other Ambulatory Visit: Payer: BC Managed Care – PPO

## 2019-05-14 ENCOUNTER — Encounter: Payer: Self-pay | Admitting: Family Medicine

## 2019-05-14 DIAGNOSIS — J069 Acute upper respiratory infection, unspecified: Secondary | ICD-10-CM | POA: Insufficient documentation

## 2019-05-14 MED ORDER — BENZONATATE 200 MG PO CAPS: 200.0000 mg | ORAL_CAPSULE | Freq: Three times a day (TID) | ORAL | 1 refills | Status: DC | PRN

## 2019-05-14 MED ORDER — ALBUTEROL SULFATE HFA 108 (90 BASE) MCG/ACT IN AERS: 2.0000 | INHALATION_SPRAY | RESPIRATORY_TRACT | 0 refills | Status: DC | PRN

## 2019-05-14 NOTE — Assessment & Plan Note (Signed)
And mild reactive airways  Give info to get tested for covid -will call with result and isolate until then  Fluids/rest/saline Day quil/nyquil prn Check temp  Tessalon sent in for cough Albuterol mdi for wheezing prn  Update if worse or not starting to improve in 3-4 d  Update with test result Stay out of work for now

## 2019-05-14 NOTE — Progress Notes (Signed)
Virtual Visit via Video Note  I connected with [name] on 05/14/19 at  9:30 AM EDT by a video enabled telemedicine application and verified that I am speaking with the correct person using two identifiers.  Location: Patient: home Provider: office   I discussed the limitations of evaluation and management by telemedicine and the availability of in person appointments. The patient expressed understanding and agreed to proceed.  Parties involved in encounter  Patient: Engineer, manufacturing  Provider:  Roxy Manns MD    History of Present Illness: Past few days- upper resp symptoms  44 yo pt of NP [facility]  Yellow nasal drainage  Congested/runny nose   Now productive cough  A little wheezing  No sob   Ears feel a little stuffy Throat is a little sore/esp with pnd   A little sinus pressure -mild  No pain   No fever  No chills or aches No temp   No loss of taste or smell  No GI symptoms    No sick contacts  Has not had covid or been immunized   He works in public   Has taken dayquil -it helps  For allergies - claritin and occ afrin  Does not use saline   Seasonal allergies- not severe   He has wheezed with illness in the past   Patient Active Problem List   Diagnosis Date Noted  . Viral URI with cough 05/14/2019  . Cellulitis of scrotum 04/21/2019  . Preventative health care 10/24/2018  . Hidradenitis   . Chickenpox 06/17/2017  . GAD (generalized anxiety disorder) 05/09/2017  . Essential hypertension 04/25/2017  . Hypertriglyceridemia 04/25/2017   Past Medical History:  Diagnosis Date  . Anorectal abscess   . Chickenpox 06/17/2017  . Erythrasma 2020  . Essential hypertension 04/25/2017  . Hyperlipidemia   . Hypertension   . Hypertriglyceridemia 04/25/2017   History reviewed. No pertinent surgical history. Social History   Tobacco Use  . Smoking status: Never Smoker  . Smokeless tobacco: Never Used  Substance Use Topics  . Alcohol  use: Yes  . Drug use: Never   Family History  Problem Relation Age of Onset  . Arthritis Mother   . Diabetes Mother   . Heart disease Mother   . Hyperlipidemia Mother   . Hypertension Mother   . Cancer Father        lung  . COPD Father   . Arthritis Sister   . Arthritis Maternal Grandmother   . Diabetes Maternal Grandmother   . Heart disease Maternal Grandmother   . Hyperlipidemia Maternal Grandmother   . Hypertension Maternal Grandmother   . Arthritis Maternal Grandfather   . Heart attack Maternal Grandfather   . Heart disease Maternal Grandfather   . Hyperlipidemia Maternal Grandfather   . Hypertension Maternal Grandfather   . Arthritis Paternal Grandmother   . Melanoma Paternal Grandmother   . Hyperlipidemia Paternal Grandmother   . Hypertension Paternal Grandmother   . Arthritis Paternal Grandfather   . Diabetes Paternal Grandfather   . Heart attack Paternal Grandfather   . Hyperlipidemia Paternal Grandfather   . Hypertension Paternal Grandfather    No Known Allergies Current Outpatient Medications on File Prior to Visit  Medication Sig Dispense Refill  . B Complex-Biotin-FA (B-COMPLEX PO) Take 35 mg by mouth daily.    . Cholecalciferol (VITAMIN D3 PO) Take 1 capsule by mouth daily.    Marland Kitchen escitalopram (LEXAPRO) 10 MG tablet TAKE 1 TABLET BY MOUTH EVERY DAY 90 tablet 2  .  famotidine (PEPCID) 20 MG tablet Take 20 mg by mouth daily.    . fenofibrate 160 MG tablet TAKE 1 TABLET BY MOUTH ONCE DAILY FOR HIGH CHOLESTEROL. 90 tablet 2  . hydrochlorothiazide (HYDRODIURIL) 25 MG tablet Take 1 tablet (25 mg total) by mouth daily. For blood pressure. 90 tablet 3  . losartan (COZAAR) 100 MG tablet Take 1 tablet (100 mg total) by mouth daily. For blood pressure. 90 tablet 3  . Omega-3 Fatty Acids (FISH OIL PO) Take 1 capsule by mouth daily.     No current facility-administered medications on file prior to visit.   Review of Systems  Constitutional: Negative for chills, fever and  malaise/fatigue.  HENT: Positive for congestion. Negative for ear pain, nosebleeds, sinus pain and sore throat.   Eyes: Negative for blurred vision, discharge and redness.  Respiratory: Positive for cough, sputum production and wheezing. Negative for shortness of breath and stridor.   Cardiovascular: Negative for chest pain, palpitations and leg swelling.  Gastrointestinal: Negative for abdominal pain, diarrhea, nausea and vomiting.  Musculoskeletal: Negative for myalgias.  Skin: Negative for rash.  Neurological: Negative for dizziness and headaches.    Observations/Objective: Patient appears well, in no distress Weight is baseline  No facial swelling or asymmetry Normal voice-not hoarse and no slurred speech (is nasal sounding)  No obvious tremor or mobility impairment Moving neck and UEs normally Able to hear the call well  No cough or shortness of breath during interview  Talkative and mentally sharp with no cognitive changes No skin changes on face or neck , no rash or pallor Affect is normal    Assessment and Plan: Problem List Items Addressed This Visit      Respiratory   Viral URI with cough    And mild reactive airways  Give info to get tested for covid -will call with result and isolate until then  Fluids/rest/saline Day quil/nyquil prn Check temp  Tessalon sent in for cough Albuterol mdi for wheezing prn  Update if worse or not starting to improve in 3-4 d  Update with test result Stay out of work for now          Follow Up Instructions: Go get tested for covid 19  Isolate yourself until you get a result and call us with that result Fluids and rest  Nasal saline spray is helpful  Use albuterol inhaler as needed  Tessalon as needed for cough  otc day quil is ok  If suddenly worse-go to the ER If new symptoms or not improving please call    I discussed the assessment and treatment plan with the patient. The patient was provided an opportunity to ask  questions and all were answered. The patient agreed with the plan and demonstrated an understanding of the instructions.   The patient was advised to call back or seek an in-person evaluation if the symptoms worsen or if the condition fails to improve as anticipated.     Cody Pardon, MD

## 2019-05-14 NOTE — Patient Instructions (Addendum)
Go get tested for covid 19  Isolate yourself until you get a result and call us with that result Fluids and rest  Nasal saline spray is helpful  Use albuterol inhaler as needed  Tessalon as needed for cough  otc day quil is ok  If suddenly worse-go to the ER If new symptoms or not improving please call    How to Use a Metered Dose Inhaler A metered dose inhaler is a handheld device for taking medicine that must be breathed into the lungs (inhaled). The device can be used to deliver a variety of inhaled medicines, including:  Quick relief or rescue medicines, such as bronchodilators.  Controller medicines, such as corticosteroids. The medicine is delivered by pushing down on a metal canister to release a preset amount of spray and medicine. Each device contains the amount of medicine that is needed for a preset number of uses (inhalations). Your health care provider may recommend that you use a spacer with your inhaler to help you take the medicine more effectively. A spacer is a plastic tube with a mouthpiece on one end and an opening that connects to the inhaler on the other end. A spacer holds the medicine in a tube for a short time, which allows you to inhale more medicine. What are the risks? If you do not use your inhaler correctly, medicine might not reach your lungs to help you breathe. Inhaler medicine can cause side effects, such as:  Mouth or throat infection.  Cough.  Hoarseness.  Headache.  Nausea and vomiting.  Lung infection (pneumonia) in people who have a lung condition called COPD. How to use a metered dose inhaler without a spacer  1. Remove the cap from the inhaler. 2. If you are using the inhaler for the first time, shake it for 5 seconds, turn it away from your face, then release 4 puffs into the air. This is called priming. 3. Shake the inhaler for 5 seconds. 4. Position the inhaler so the top of the canister faces up. 5. Put your index finger on the top  of the medicine canister. Support the bottom of the inhaler with your thumb. 6. Breathe out normally and as completely as possible, away from the inhaler. 7. Either place the inhaler between your teeth and close your lips tightly around the mouthpiece, or hold the inhaler 1-2 inches (2.5-5 cm) away from your open mouth. Keep your tongue down out of the way. If you are unsure which technique to use, ask your health care provider. 8. Press the canister down with your index finger to release the medicine, then inhale deeply and slowly through your mouth (not your nose) until your lungs are completely filled. Inhaling should take 4-6 seconds. 9. Hold the medicine in your lungs for 5-10 seconds (10 seconds is best). This helps the medicine get into the small airways of your lungs. 10. With your lips in a tight circle (pursed), breathe out slowly. 11. Repeat steps 3-10 until you have taken the number of puffs that your health care provider directed. Wait about 1 minute between puffs or as directed. 12. Put the cap on the inhaler. 13. If you are using a steroid inhaler, rinse your mouth with water, gargle, and spit out the water. Do not swallow the water. How to use a metered dose inhaler with a spacer  1. Remove the cap from the inhaler. 2. If you are using the inhaler for the first time, shake it for 5 seconds, turn  it away from your face, then release 4 puffs into the air. This is called priming. 3. Shake the inhaler for 5 seconds. 4. Place the open end of the spacer onto the inhaler mouthpiece. 5. Position the inhaler so the top of the canister faces up and the spacer mouthpiece faces you. 6. Put your index finger on the top of the medicine canister. Support the bottom of the inhaler and the spacer with your thumb. 7. Breathe out normally and as completely as possible, away from the spacer. 8. Place the spacer between your teeth and close your lips tightly around it. Keep your tongue down out of the  way. 9. Press the canister down with your index finger to release the medicine, then inhale deeply and slowly through your mouth (not your nose) until your lungs are completely filled. Inhaling should take 4-6 seconds. 10. Hold the medicine in your lungs for 5-10 seconds (10 seconds is best). This helps the medicine get into the small airways of your lungs. 11. With your lips in a tight circle (pursed), breathe out slowly. 12. Repeat steps 3-11 until you have taken the number of puffs that your health care provider directed. Wait about 1 minute between puffs or as directed. 13. Remove the spacer from the inhaler and put the cap on the inhaler. 14. If you are using a steroid inhaler, rinse your mouth with water, gargle, and spit out the water. Do not swallow the water. Follow these instructions at home:  Take your inhaled medicine only as told by your health care provider. Do not use the inhaler more than directed by your health care provider.  Keep all follow-up visits as told by your health care provider. This is important.  If your inhaler has a counter, you can check it to determine how full your inhaler is. If your inhaler does not have a counter, ask your health care provider when you will need to refill your inhaler and write the refill date on a calendar or on your inhaler canister. Note that you cannot know when an inhaler is empty by shaking it.  Follow directions on the package insert for care and cleaning of your inhaler and spacer. Contact a health care provider if:  Symptoms are only partially relieved with your inhaler.  You are having trouble using your inhaler.  You have an increase in phlegm.  You have headaches. Get help right away if:  You feel little or no relief after using your inhaler.  You have dizziness.  You have a fast heart rate.  You have chills or a fever.  You have night sweats.  There is blood in your phlegm. Summary  A metered dose inhaler is a  handheld device for taking medicine that must be breathed into the lungs (inhaled).  The medicine is delivered by pushing down on a metal canister to release a preset amount of spray and medicine.  Each device contains the amount of medicine that is needed for a preset number of uses (inhalations). This information is not intended to replace advice given to you by your health care provider. Make sure you discuss any questions you have with your health care provider. Document Revised: 12/14/2016 Document Reviewed: 11/22/2015 Elsevier Patient Education  2020 [employer].

## 2019-05-16 ENCOUNTER — Encounter: Payer: Self-pay | Admitting: Family Medicine

## 2019-05-18 ENCOUNTER — Encounter: Payer: Self-pay | Admitting: Family Medicine

## 2019-06-05 ENCOUNTER — Encounter (HOSPITAL_COMMUNITY)
Admission: EM | Disposition: A | Discharge status: Home / Self Care | Payer: Self-pay | Source: Home / Self Care | Attending: Emergency Medicine

## 2019-06-05 ENCOUNTER — Observation Stay (HOSPITAL_COMMUNITY)
Admission: EM | Admit: 2019-06-05 | Discharge: 2019-06-06 | Disposition: A | Discharge status: Home / Self Care | Payer: BC Managed Care – PPO | Attending: Urology | Admitting: Urology

## 2019-06-05 ENCOUNTER — Other Ambulatory Visit: Payer: Self-pay | Admitting: Primary Care

## 2019-06-05 ENCOUNTER — Other Ambulatory Visit: Payer: Self-pay

## 2019-06-05 ENCOUNTER — Encounter: Payer: Self-pay | Admitting: Primary Care

## 2019-06-05 ENCOUNTER — Ambulatory Visit (INDEPENDENT_AMBULATORY_CARE_PROVIDER_SITE_OTHER): Payer: BC Managed Care – PPO | Admitting: Primary Care

## 2019-06-05 ENCOUNTER — Encounter (HOSPITAL_COMMUNITY): Payer: Self-pay | Admitting: Certified Registered Nurse Anesthetist

## 2019-06-05 ENCOUNTER — Telehealth: Payer: Self-pay

## 2019-06-05 ENCOUNTER — Ambulatory Visit
Admission: RE | Admit: 2019-06-05 | Discharge: 2019-06-05 | Disposition: A | Discharge status: Home / Self Care | Payer: BC Managed Care – PPO | Source: Ambulatory Visit | Attending: Primary Care | Admitting: Primary Care

## 2019-06-05 ENCOUNTER — Encounter (HOSPITAL_COMMUNITY): Payer: Self-pay

## 2019-06-05 VITALS — BP 140/82 | HR 103 | Temp 96.2°F | Ht 75.0 in | Wt 339.5 lb

## 2019-06-05 DIAGNOSIS — I10 Essential (primary) hypertension: Secondary | ICD-10-CM | POA: Insufficient documentation

## 2019-06-05 DIAGNOSIS — L0291 Cutaneous abscess, unspecified: Secondary | ICD-10-CM

## 2019-06-05 DIAGNOSIS — Z20822 Contact with and (suspected) exposure to covid-19: Secondary | ICD-10-CM | POA: Insufficient documentation

## 2019-06-05 DIAGNOSIS — E785 Hyperlipidemia, unspecified: Secondary | ICD-10-CM | POA: Insufficient documentation

## 2019-06-05 DIAGNOSIS — N492 Inflammatory disorders of scrotum: Secondary | ICD-10-CM | POA: Diagnosis not present

## 2019-06-05 DIAGNOSIS — E669 Obesity, unspecified: Secondary | ICD-10-CM | POA: Insufficient documentation

## 2019-06-05 DIAGNOSIS — Z8619 Personal history of other infectious and parasitic diseases: Secondary | ICD-10-CM | POA: Diagnosis not present

## 2019-06-05 DIAGNOSIS — F411 Generalized anxiety disorder: Secondary | ICD-10-CM | POA: Diagnosis not present

## 2019-06-05 DIAGNOSIS — N451 Epididymitis: Secondary | ICD-10-CM | POA: Diagnosis present

## 2019-06-05 DIAGNOSIS — Z79899 Other long term (current) drug therapy: Secondary | ICD-10-CM | POA: Insufficient documentation

## 2019-06-05 DIAGNOSIS — Z8249 Family history of ischemic heart disease and other diseases of the circulatory system: Secondary | ICD-10-CM | POA: Diagnosis not present

## 2019-06-05 DIAGNOSIS — N5082 Scrotal pain: Secondary | ICD-10-CM | POA: Diagnosis not present

## 2019-06-05 DIAGNOSIS — Z6841 Body Mass Index (BMI) 40.0 and over, adult: Secondary | ICD-10-CM | POA: Diagnosis not present

## 2019-06-05 HISTORY — DX: Scrotal pain: N50.82

## 2019-06-05 HISTORY — DX: Epididymitis: N45.1

## 2019-06-05 HISTORY — DX: Hidradenitis suppurativa: L73.2

## 2019-06-05 LAB — PROTIME-INR
INR: 1 (ref 0.8–1.2)
Prothrombin Time: 13 seconds (ref 11.4–15.2)

## 2019-06-05 LAB — COMPREHENSIVE METABOLIC PANEL
ALT: 25 U/L (ref 0–44)
AST: 24 U/L (ref 15–41)
Albumin: 4 g/dL (ref 3.5–5.0)
Alkaline Phosphatase: 43 U/L (ref 38–126)
Anion gap: 12 (ref 5–15)
BUN: 21 mg/dL — ABNORMAL HIGH (ref 6–20)
CO2: 26 mmol/L (ref 22–32)
Calcium: 9.4 mg/dL (ref 8.9–10.3)
Chloride: 99 mmol/L (ref 98–111)
Creatinine, Ser: 0.94 mg/dL (ref 0.61–1.24)
GFR calc Af Amer: >60 mL/min (ref >60)
GFR calc non Af Amer: >60 mL/min (ref >60)
Glucose, Bld: 135 mg/dL — ABNORMAL HIGH (ref 70–99)
Potassium: 3.4 mmol/L — ABNORMAL LOW (ref 3.5–5.1)
Sodium: 137 mmol/L (ref 135–145)
Total Bilirubin: 0.6 mg/dL (ref 0.3–1.2)
Total Protein: 8.1 g/dL (ref 6.5–8.1)

## 2019-06-05 LAB — URINALYSIS, ROUTINE W REFLEX MICROSCOPIC
Bilirubin Urine: NEGATIVE
Glucose, UA: NEGATIVE mg/dL
Hgb urine dipstick: NEGATIVE
Ketones, ur: NEGATIVE mg/dL
Leukocytes,Ua: NEGATIVE
Nitrite: NEGATIVE
Protein, ur: NEGATIVE mg/dL
Specific Gravity, Urine: 1.024 (ref 1.005–1.030)
pH: 8 (ref 5.0–8.0)

## 2019-06-05 LAB — CBC WITH DIFFERENTIAL/PLATELET
Abs Immature Granulocytes: 0.01 10*3/uL (ref 0.00–0.07)
Basophils Absolute: 0.1 10*3/uL (ref 0.0–0.1)
Basophils Relative: 1 %
Eosinophils Absolute: 0.1 10*3/uL (ref 0.0–0.5)
Eosinophils Relative: 2 %
HCT: 43 % (ref 39.0–52.0)
Hemoglobin: 14.6 g/dL (ref 13.0–17.0)
Immature Granulocytes: 0 %
Lymphocytes Relative: 29 %
Lymphs Abs: 1.9 10*3/uL (ref 0.7–4.0)
MCH: 31.5 pg (ref 26.0–34.0)
MCHC: 34 g/dL (ref 30.0–36.0)
MCV: 92.7 fL (ref 80.0–100.0)
Monocytes Absolute: 0.8 10*3/uL (ref 0.1–1.0)
Monocytes Relative: 12 %
Neutro Abs: 3.7 10*3/uL (ref 1.7–7.7)
Neutrophils Relative %: 56 %
Platelets: 239 10*3/uL (ref 150–400)
RBC: 4.64 MIL/uL (ref 4.22–5.81)
RDW: 13.3 % (ref 11.5–15.5)
WBC: 6.6 10*3/uL (ref 4.0–10.5)
nRBC: 0 % (ref 0.0–0.2)

## 2019-06-05 LAB — POCT I-STAT CREATININE: Creatinine, Ser: 1 mg/dL (ref 0.61–1.24)

## 2019-06-05 LAB — SARS CORONAVIRUS 2 BY RT PCR (HOSPITAL ORDER, PERFORMED IN ~~LOC~~ HOSPITAL LAB): SARS Coronavirus 2: NEGATIVE

## 2019-06-05 LAB — APTT: aPTT: 27 seconds (ref 24–36)

## 2019-06-05 LAB — LACTIC ACID, PLASMA: Lactic Acid, Venous: 1.9 mmol/L (ref 0.5–1.9)

## 2019-06-05 SURGERY — EXPLORATION, SCROTUM
Anesthesia: General

## 2019-06-05 MED ORDER — FLEET ENEMA 7-19 GM/118ML RE ENEM: 1.0000 | ENEMA | Freq: Once | RECTAL | Status: DC | PRN

## 2019-06-05 MED ORDER — VANCOMYCIN HCL IN DEXTROSE 1-5 GM/200ML-% IV SOLN
1000.0000 mg | Freq: Once | INTRAVENOUS | Status: AC
  Administered 2019-06-05: 1000 mg via INTRAVENOUS
  Filled 2019-06-05: qty 200

## 2019-06-05 MED ORDER — LACTATED RINGERS IV SOLN: INTRAVENOUS | Status: DC

## 2019-06-05 MED ORDER — PROPOFOL 500 MG/50ML IV EMUL
INTRAVENOUS | Status: AC
  Filled 2019-06-05: qty 50

## 2019-06-05 MED ORDER — ACETAMINOPHEN 325 MG PO TABS: 650.0000 mg | ORAL_TABLET | ORAL | Status: DC | PRN

## 2019-06-05 MED ORDER — PIPERACILLIN-TAZOBACTAM 3.375 G IVPB 30 MIN
3.3750 g | Freq: Once | INTRAVENOUS | Status: AC
  Administered 2019-06-05: 3.375 g via INTRAVENOUS
  Filled 2019-06-05: qty 50

## 2019-06-05 MED ORDER — SODIUM CHLORIDE 0.9 % IV SOLN
1000.0000 mL | INTRAVENOUS | Status: DC
  Administered 2019-06-05: 1000 mL via INTRAVENOUS

## 2019-06-05 MED ORDER — BISACODYL 10 MG RE SUPP: 10.0000 mg | Freq: Every day | RECTAL | Status: DC | PRN

## 2019-06-05 MED ORDER — DIPHENHYDRAMINE HCL 50 MG/ML IJ SOLN: 12.5000 mg | Freq: Four times a day (QID) | INTRAMUSCULAR | Status: DC | PRN

## 2019-06-05 MED ORDER — SULFAMETHOXAZOLE-TRIMETHOPRIM 800-160 MG PO TABS: 1.0000 | ORAL_TABLET | Freq: Two times a day (BID) | ORAL | 0 refills | Status: DC

## 2019-06-05 MED ORDER — BUPIVACAINE HCL (PF) 0.25 % IJ SOLN
INTRAMUSCULAR | Status: AC
  Filled 2019-06-05: qty 30

## 2019-06-05 MED ORDER — FENTANYL CITRATE (PF) 250 MCG/5ML IJ SOLN
INTRAMUSCULAR | Status: AC
  Filled 2019-06-05: qty 5

## 2019-06-05 MED ORDER — LIDOCAINE 2% (20 MG/ML) 5 ML SYRINGE
INTRAMUSCULAR | Status: AC
  Filled 2019-06-05: qty 5

## 2019-06-05 MED ORDER — OXYCODONE HCL 5 MG PO TABS: 5.0000 mg | ORAL_TABLET | ORAL | Status: DC | PRN

## 2019-06-05 MED ORDER — DIPHENHYDRAMINE HCL 12.5 MG/5ML PO ELIX: 12.5000 mg | ORAL_SOLUTION | Freq: Four times a day (QID) | ORAL | Status: DC | PRN

## 2019-06-05 MED ORDER — SENNOSIDES-DOCUSATE SODIUM 8.6-50 MG PO TABS: 1.0000 | ORAL_TABLET | Freq: Every evening | ORAL | Status: DC | PRN

## 2019-06-05 MED ORDER — PIPERACILLIN-TAZOBACTAM 3.375 G IVPB
3.3750 g | Freq: Three times a day (TID) | INTRAVENOUS | Status: DC
  Administered 2019-06-06 (×2): 3.375 g via INTRAVENOUS
  Filled 2019-06-05 (×2): qty 50

## 2019-06-05 MED ORDER — ONDANSETRON HCL 4 MG/2ML IJ SOLN: 4.0000 mg | INTRAMUSCULAR | Status: DC | PRN

## 2019-06-05 MED ORDER — VANCOMYCIN HCL 2000 MG/400ML IV SOLN
2000.0000 mg | Freq: Two times a day (BID) | INTRAVENOUS | Status: DC
  Filled 2019-06-05: qty 400

## 2019-06-05 MED ORDER — CHLORHEXIDINE GLUCONATE 0.12 % MT SOLN: 15.0000 mL | Freq: Once | OROMUCOSAL | Status: DC

## 2019-06-05 MED ORDER — ROCURONIUM BROMIDE 10 MG/ML (PF) SYRINGE
PREFILLED_SYRINGE | INTRAVENOUS | Status: AC
  Filled 2019-06-05: qty 10

## 2019-06-05 MED ORDER — HEPARIN SODIUM (PORCINE) 5000 UNIT/ML IJ SOLN
5000.0000 [IU] | Freq: Three times a day (TID) | INTRAMUSCULAR | Status: DC
  Administered 2019-06-05 – 2019-06-06 (×2): 5000 [IU] via SUBCUTANEOUS
  Filled 2019-06-05 (×2): qty 1

## 2019-06-05 MED ORDER — MIDAZOLAM HCL 2 MG/2ML IJ SOLN
INTRAMUSCULAR | Status: AC
  Filled 2019-06-05: qty 2

## 2019-06-05 MED ORDER — HYDROMORPHONE HCL 1 MG/ML IJ SOLN: 0.5000 mg | INTRAMUSCULAR | Status: DC | PRN

## 2019-06-05 MED ORDER — VANCOMYCIN HCL 1500 MG/300ML IV SOLN
1500.0000 mg | Freq: Once | INTRAVENOUS | Status: AC
  Administered 2019-06-05: 1500 mg via INTRAVENOUS
  Filled 2019-06-05: qty 300

## 2019-06-05 MED ORDER — SUCCINYLCHOLINE CHLORIDE 200 MG/10ML IV SOSY
PREFILLED_SYRINGE | INTRAVENOUS | Status: AC
  Filled 2019-06-05: qty 10

## 2019-06-05 MED ORDER — ZOLPIDEM TARTRATE 5 MG PO TABS: 5.0000 mg | ORAL_TABLET | Freq: Every evening | ORAL | Status: DC | PRN

## 2019-06-05 MED ORDER — IOHEXOL 350 MG/ML SOLN
100.0000 mL | Freq: Once | INTRAVENOUS | Status: AC | PRN
  Administered 2019-06-05: 100 mL via INTRAVENOUS

## 2019-06-05 MED ORDER — POTASSIUM CHLORIDE IN NACL 20-0.45 MEQ/L-% IV SOLN
INTRAVENOUS | Status: DC
  Filled 2019-06-05 (×2): qty 1000

## 2019-06-05 NOTE — Addendum Note (Signed)
Addended by: Doreene Nest on: 06/05/2019 08:27 AM   Modules accepted: Orders

## 2019-06-05 NOTE — ED Notes (Signed)
Urologist at bedside.

## 2019-06-05 NOTE — Assessment & Plan Note (Signed)
Unclear if this is actually cellulitis given recurrent symptoms within one month after two week treatment with antibiotics.   Cover with additional antibiotics for now as he did notice improvement last time. CT of scrotum pending as recommended by radiology in [place] of scrotum.  He is stable for outpatient treatment. Await results. May need Urology referral.

## 2019-06-05 NOTE — Consult Note (Signed)
Urology Consult Note   Requesting Attending Physician:  Lacretia Leigh, MD Service Providing Consult: Urology  Consulting Attending: Dr. Irine Seal   Reason for Consult: Right testicular pain  HPI: [name] is seen in consultation for reasons noted above at the request of Lacretia Leigh, MD for evaluation of right testicular pain.  This is a 44 y.o. male with with a prior history of hidradenitis managed at [facility], prior anorectal abscess drained in 2019, hypertension, hyperlipidemia, obesity who presents with right testicular pain.  Reports 4 weeks ago he developed what sounds like folliculitis after a long workday which may have self resolved but ultimately went on to have right testicular pain.  Had a scrotal ultrasound on 04/14/2019 the demonstrates normal right testicle with good blood flow but a enlarged and hypervascular epididymis.  Diagnosed with epididymitis at that time.  Started on antibiotics at that time which she completed.  Reports approximate 90% resolution.  Unfortunately 4 days ago redeveloped symptoms.  Interestingly, denies fevers or chills.  Also denies dysuria, hematuria, lower urinary tract symptoms.  Denies any history of diabetes.  Drinks about 6 drinks per week, no history of tobacco use or abuse.  No other illicits.  Does have a history of hidradenitis for which he has been using topical medications for.  Actually has an appointment with [facility] in several weeks.   On exam, he does have a divot in his midline posterior scrotum which she reports is always been there.  Denies any prior history of urologic issues.  Denies any prior history of scrotal surgery.  Only abscess that has ever been drained was an anorectal abscess.  He has never had prior scrotal issues.  Denies any history of STIs or STDs.   Past Medical History: Past Medical History:  Diagnosis Date  . Anorectal abscess   . Chickenpox 06/17/2017  . Erythrasma 2020  . Essential hypertension  04/25/2017  . Hydradenitis   . Hyperlipidemia   . Hypertension   . Hypertriglyceridemia 04/25/2017    Past Surgical History:  History reviewed. No pertinent surgical history.  Medication: Current Facility-Administered Medications  Medication Dose Route Frequency Provider Last Rate Last Admin  . 0.9 %  sodium chloride infusion  1,000 mL Intravenous Continuous Lacretia Leigh, MD 125 mL/hr at 06/05/19 1558 1,000 mL at 06/05/19 1558  . piperacillin-tazobactam (ZOSYN) IVPB 3.375 g  3.375 g Intravenous Once Lacretia Leigh, MD 100 mL/hr at 06/05/19 1600 3.375 g at 06/05/19 1600  . vancomycin (VANCOCIN) IVPB 1000 mg/200 mL premix  1,000 mg Intravenous Once Lacretia Leigh, MD 200 mL/hr at 06/05/19 1600 1,000 mg at 06/05/19 1600   Current Outpatient Medications  Medication Sig Dispense Refill  . albuterol (VENTOLIN HFA) 108 (90 Base) MCG/ACT inhaler Inhale 2 puffs into the lungs every 4 (four) hours as needed for wheezing. 18 g 0  . B Complex-Biotin-FA (B-COMPLEX PO) Take 35 mg by mouth daily.    . benzonatate (TESSALON) 200 MG capsule Take 1 capsule (200 mg total) by mouth 3 (three) times daily as needed for cough. Swallow whole, do not bite pill (Patient not taking: Reported on 06/05/2019) 30 capsule 1  . Cholecalciferol (VITAMIN D3 PO) Take 1 capsule by mouth daily.    Marland Kitchen escitalopram (LEXAPRO) 10 MG tablet TAKE 1 TABLET BY MOUTH EVERY DAY 90 tablet 2  . famotidine (PEPCID) 20 MG tablet Take 20 mg by mouth daily.    . fenofibrate 160 MG tablet TAKE 1 TABLET BY MOUTH ONCE DAILY FOR HIGH  CHOLESTEROL. 90 tablet 2  . hydrochlorothiazide (HYDRODIURIL) 25 MG tablet Take 1 tablet (25 mg total) by mouth daily. For blood pressure. 90 tablet 3  . losartan (COZAAR) 100 MG tablet Take 1 tablet (100 mg total) by mouth daily. For blood pressure. 90 tablet 3  . Omega-3 Fatty Acids (FISH OIL PO) Take 1 capsule by mouth daily.    Marland Kitchen sulfamethoxazole-trimethoprim (BACTRIM DS) 800-160 MG tablet Take 1 tablet by mouth  2 (two) times daily. 14 tablet 0    Allergies: No Known Allergies  Social History: Social History   Tobacco Use  . Smoking status: Never Smoker  . Smokeless tobacco: Never Used  Substance Use Topics  . Alcohol use: Yes  . Drug use: Never    Family History Family History  Problem Relation Age of Onset  . Arthritis Mother   . Diabetes Mother   . Heart disease Mother   . Hyperlipidemia Mother   . Hypertension Mother   . Cancer Father        lung  . COPD Father   . Arthritis Sister   . Arthritis Maternal Grandmother   . Diabetes Maternal Grandmother   . Heart disease Maternal Grandmother   . Hyperlipidemia Maternal Grandmother   . Hypertension Maternal Grandmother   . Arthritis Maternal Grandfather   . Heart attack Maternal Grandfather   . Heart disease Maternal Grandfather   . Hyperlipidemia Maternal Grandfather   . Hypertension Maternal Grandfather   . Arthritis Paternal Grandmother   . Melanoma Paternal Grandmother   . Hyperlipidemia Paternal Grandmother   . Hypertension Paternal Grandmother   . Arthritis Paternal Grandfather   . Diabetes Paternal Grandfather   . Heart attack Paternal Grandfather   . Hyperlipidemia Paternal Grandfather   . Hypertension Paternal Grandfather     Review of Systems 10 systems were reviewed and are negative except as noted specifically in the HPI.  Objective   Vital signs in last 24 hours: BP (!) 146/87   Pulse 86   Temp 98.5 F (36.9 C)   Resp 17   Ht 6\' 3"  (1.905 m)   Wt (!) 154 kg   SpO2 97%   BMI 42.43 kg/m   Physical Exam General: NAD, A&O, resting, appropriate HEENT: [place]/AT, EOMI, MMM Pulmonary: Normal work of breathing Cardiovascular: HDS, adequate peripheral perfusion Abdomen: Soft, NTTP, nondistended.  Obese. GU: Somewhat buried penis with a large suprapubic fat pad, circumcised, normal urethral meatus, mild smegma.  Bilateral testicles are descended although the right testicle is somewhat high riding with  induration more posterior and tenderness.  He does have a divot area in the midline of the more posterior scrotum with some degree of induration at this location as well.  There are no signs of fistula tracts.  There is no evidence of hidradenitis with an the scrotum, inguinal creases or perineum. Extremities: warm and well perfused Neuro: Appropriate, no focal neurological deficits  Most Recent Labs: Lab Results  Component Value Date   WBC 7.8 04/14/2019   HGB 14.3 04/14/2019   HCT 41.5 04/14/2019   PLT 303.0 04/14/2019    Lab Results  Component Value Date   NA 134 (L) 04/14/2019   K 4.2 04/14/2019   CL 96 04/14/2019   CO2 29 04/14/2019   BUN 19 04/14/2019   CREATININE 1.00 06/05/2019   CALCIUM 9.5 04/14/2019    IMAGING: CT PELVIS W CONTRAST  Result Date: 06/05/2019 CLINICAL DATA:  Unclear if this is actually cellulitis given recurrent symptoms within  one month after two week treatment with antibiotics. Scrotal pain and tightness. EXAM: CT PELVIS WITH CONTRAST TECHNIQUE: Multidetector CT imaging of the pelvis was performed using the standard protocol following the bolus administration of intravenous contrast. CONTRAST:  OMNIPAQUE IOHEXOL 350 MG/ML SOLN COMPARISON:  Ultrasound 04/14/2019, CT 06/13/2017 FINDINGS: Urinary Tract: LOWER poles of both kidneys and ureters are unremarkable. The bladder is decompressed. No ureteral stone. Bowel:  Unremarkable visualized pelvic bowel loops. Normal appendix. Vascular/Lymphatic: No significant vascular abnormality seen. Small bilateral inguinal lymph nodes, largest on the RIGHT measuring 1.4 centimeters in short axis. These are likely reactive lymph nodes. Reproductive: There is asymmetric edema involving the RIGHT hemiscrotum. There is skin thickening of the scrotum, RIGHT greater than LEFT. A single locule of gas is identified in the MEDIAL aspect of the RIGHT hemiscrotum, associated with inflammatory changes. Findings are suspicious for  early infection by gas-forming organisms. Appearance of the penis is unremarkable. Other:  No ascites. Anterior abdominal wall is unremarkable. Musculoskeletal: Degenerative changes are seen in the LOWER lumbar spine. No suspicious lytic or blastic lesions are identified. IMPRESSION: 1. Gas and inflammatory changes within the RIGHT hemiscrotum, suspicious for developing Fournier's gangrene. 2. Small bilateral inguinal lymph nodes, largest on the RIGHT measuring 1.4 centimeters in short axis, likely reactive. 3. Normal appendix. 4. Degenerative changes in the LOWER lumbar spine. These results were called by telephone at the time of interpretation on 06/05/2019 at 10:00 am to provider Vernona Rieger , who verbally acknowledged these results. Electronically Signed   By: Norva Pavlov M.D.   On: 06/05/2019 10:00    ------  Assessment:  44 y.o. male with history of hidradenitis, anorectal abscess, hypertension, hyperlipidemia who presents after being treated for right epididymitis proximal a month ago with recurrent right scrotal pain.  Afebrile, mild tachycardia to 103, no leukocytosis, no AKI.  Unfortunately, CT pelvis with contrast 06/05/2019 demonstrates right hemiscrotal edema, locule of gas in the medial aspect of the right hemiscrotum with inflammatory changes; also with right inguinal lymphadenopathy, 1.4 cm in short axis, favor reactive.  We had a frank discussion with the patient and the bedside regarding his prior and current symptoms.  We discussed his laboratory and imaging findings.  Given his history of hidradenitis and anorectal abscess, favor infection progressing from this source although could have had Denovo refractory epididymitis now with necrosis.  We discussed the indications for surgical intervention with scrotal exploration, possible incision and drainage versus excision and drainage.  We did discuss the risks of bleeding, infection, testicular loss, further surgeries.  Given his  risk factors, previous diagnosis of epididymitis, concurrent hidradenitis, discussed proceeding the operating room for scrotal exploration, he is agreeable.  Recommendations: Keep patient n.p.o. Covid test Plan for OR as above. Agree with broad-spectrum antibiotics, vancomycin and Zosyn   Thank you for this consult. Please contact the urology consult pager with any further questions/concerns.

## 2019-06-05 NOTE — Telephone Encounter (Signed)
06-05-19 Gen Surg Referral closed.  Per [facility] pt needs to go to ED to be evaluated.  Spoke w/pt.  He will go to [facility] ED now.

## 2019-06-05 NOTE — Patient Instructions (Signed)
Start Bactrim DS (sulfamethoxazole/trimethoprim) tablets. Take 1 tablet by mouth twice daily for 7 days.  Stop by the front desk and speak with either Shirlee Limerick or Charmaine regarding your CT scan.  It was a pleasure to see you today!

## 2019-06-05 NOTE — ED Notes (Signed)
ED TO INPATIENT HANDOFF REPORT  Name/Age/Gender [name] 44 y.o. male  Code Status   Home/SNF/Other Home  Chief Complaint Epididymitis [N45.1]  Level of Care/Admitting Diagnosis ED Disposition    ED Disposition Condition Comment   Admit  Hospital Area: [facility] [[id number]]  Level of Care: Med-Surg [16]  Covid Evaluation: Confirmed COVID Negative  Diagnosis: Epididymitis [174944]  Admitting Physician: Bjorn Pippin [[id number]]  Attending Physician: Bjorn Pippin [[id number]]       Medical History Past Medical History:  Diagnosis Date  . Anorectal abscess   . Chickenpox 06/17/2017  . Erythrasma 2020  . Essential hypertension 04/25/2017  . Hydradenitis   . Hyperlipidemia   . Hypertension   . Hypertriglyceridemia 04/25/2017    Allergies No Known Allergies  IV Location/Drains/Wounds Patient Lines/Drains/Airways Status   Active Line/Drains/Airways    Name:   Placement date:   Placement time:   Site:   Days:   Peripheral IV 06/05/19 Right Antecubital   06/05/19    1546    Antecubital   less than 1   Peripheral IV 06/05/19 Left Hand   06/05/19    1549    Hand   less than 1          Labs/Imaging Results for orders placed or performed during the hospital encounter of 06/05/19 (from the past 48 hour(s))  Comprehensive metabolic panel     Status: Abnormal   Collection Time: 06/05/19  3:38 PM  Result Value Ref Range   Sodium 137 135 - 145 mmol/L   Potassium 3.4 (L) 3.5 - 5.1 mmol/L   Chloride 99 98 - 111 mmol/L   CO2 26 22 - 32 mmol/L   Glucose, Bld 135 (H) 70 - 99 mg/dL    Comment: Glucose reference range applies only to samples taken after fasting for at least 8 hours.   BUN 21 (H) 6 - 20 mg/dL   Creatinine, Ser 9.67 0.61 - 1.24 mg/dL   Calcium 9.4 8.9 - 59.1 mg/dL   Total Protein 8.1 6.5 - 8.1 g/dL   Albumin 4.0 3.5 - 5.0 g/dL   AST 24 15 - 41 U/L   ALT 25 0 - 44 U/L   Alkaline Phosphatase 43 38 - 126 U/L   Total Bilirubin 0.6 0.3 - 1.2  mg/dL   GFR calc non Af Amer >60 >60 mL/min   GFR calc Af Amer >60 >60 mL/min   Anion gap 12 5 - 15    Comment: Performed at [facility], [address], [place], [place] [address]  CBC WITH DIFFERENTIAL     Status: None   Collection Time: 06/05/19  3:38 PM  Result Value Ref Range   WBC 6.6 4.0 - 10.5 K/uL   RBC 4.64 4.22 - 5.81 MIL/uL   Hemoglobin 14.6 13.0 - 17.0 g/dL   HCT 65.9 93.5 - 70.1 %   MCV 92.7 80.0 - 100.0 fL   MCH 31.5 26.0 - 34.0 pg   MCHC 34.0 30.0 - 36.0 g/dL   RDW 77.9 39.0 - 30.0 %   Platelets 239 150 - 400 K/uL   nRBC 0.0 0.0 - 0.2 %   Neutrophils Relative % 56 %   Neutro Abs 3.7 1.7 - 7.7 K/uL   Lymphocytes Relative 29 %   Lymphs Abs 1.9 0.7 - 4.0 K/uL   Monocytes Relative 12 %   Monocytes Absolute 0.8 0.1 - 1.0 K/uL   Eosinophils Relative 2 %   Eosinophils Absolute  0.1 0.0 - 0.5 K/uL   Basophils Relative 1 %   Basophils Absolute 0.1 0.0 - 0.1 K/uL   Immature Granulocytes 0 %   Abs Immature Granulocytes 0.01 0.00 - 0.07 K/uL    Comment: Performed at [facility], [place] [address] [place], [place] [address]  APTT     Status: None   Collection Time: 06/05/19  3:38 PM  Result Value Ref Range   aPTT 27 24 - 36 seconds    Comment: Performed at [facility], [place] [address], [place], [place] [address]  Protime-INR     Status: None   Collection Time: 06/05/19  3:38 PM  Result Value Ref Range   Prothrombin Time 13.0 11.4 - 15.2 seconds   INR 1.0 0.8 - 1.2    Comment: (NOTE) INR goal varies based on device and disease states. Performed at [facility], [place] [address], [place], [place] [address]   SARS Coronavirus 2 by RT PCR (hospital order, performed in [facility] hospital lab) Nasopharyngeal Nasopharyngeal Swab     Status: None   Collection Time: 06/05/19  3:43 PM   Specimen: Nasopharyngeal Swab  Result Value Ref Range   SARS Coronavirus 2 NEGATIVE NEGATIVE    Comment:  (NOTE) SARS-CoV-2 target nucleic acids are NOT DETECTED. The SARS-CoV-2 RNA is generally detectable in upper and lower respiratory specimens during the acute phase of infection. The lowest concentration of SARS-CoV-2 viral copies this assay can detect is 250 copies / mL. A negative result does not preclude SARS-CoV-2 infection and should not be used as the sole basis for treatment or other patient management decisions.  A negative result may occur with improper specimen collection / handling, submission of specimen other than nasopharyngeal swab, presence of viral mutation(s) within the areas targeted by this assay, and inadequate number of viral copies (<250 copies / mL). A negative result must be combined with clinical observations, patient history, and epidemiological information. Fact Sheet for Patients:   StrictlyIdeas.no Fact Sheet for Healthcare Providers: BankingDealers.co.za This test is not yet approved or cleared  by the [place] FDA and has been authorized for detection and/or diagnosis of SARS-CoV-2 by FDA under an Emergency Use Authorization (EUA).  This EUA will remain in effect (meaning this test can be used) for the duration of the COVID-19 declaration under Section 564(b)(1) of the Act, 21 U.S.C. section 360bbb-3(b)(1), unless the authorization is terminated or revoked sooner. Performed at [facility], [place] [address], [place], [place] [address]   Lactic acid, plasma     Status: None   Collection Time: 06/05/19  3:54 PM  Result Value Ref Range   Lactic Acid, Venous 1.9 0.5 - 1.9 mmol/L    Comment: Performed at [facility], [place] [address] [place], [place] [address]  Urinalysis, Routine w reflex microscopic     Status: Abnormal   Collection Time: 06/05/19  5:23 PM  Result Value Ref Range   Color, Urine YELLOW YELLOW   APPearance CLOUDY (A) CLEAR   Specific Gravity, Urine  1.024 1.005 - 1.030   pH 8.0 5.0 - 8.0   Glucose, UA NEGATIVE NEGATIVE mg/dL   Hgb urine dipstick NEGATIVE NEGATIVE   Bilirubin Urine NEGATIVE NEGATIVE   Ketones, ur NEGATIVE NEGATIVE mg/dL   Protein, ur NEGATIVE NEGATIVE mg/dL   Nitrite NEGATIVE NEGATIVE   Leukocytes,Ua NEGATIVE NEGATIVE    Comment: Performed at [place] [address] [place], [place] [address]  CT PELVIS W CONTRAST  Result Date: 06/05/2019 CLINICAL DATA:  Unclear if this is actually cellulitis given recurrent symptoms within one month after two week treatment with antibiotics. Scrotal pain and tightness. EXAM: CT PELVIS WITH CONTRAST TECHNIQUE: Multidetector CT imaging of the pelvis was performed using the standard protocol following the bolus administration of intravenous contrast. CONTRAST:  OMNIPAQUE IOHEXOL 350 MG/ML SOLN COMPARISON:  Ultrasound 04/14/2019, CT 06/13/2017 FINDINGS: Urinary Tract: LOWER poles of both kidneys and ureters are unremarkable. The bladder is decompressed. No ureteral stone. Bowel:  Unremarkable visualized pelvic bowel loops. Normal appendix. Vascular/Lymphatic: No significant vascular abnormality seen. Small bilateral inguinal lymph nodes, largest on the RIGHT measuring 1.4 centimeters in short axis. These are likely reactive lymph nodes. Reproductive: There is asymmetric edema involving the RIGHT hemiscrotum. There is skin thickening of the scrotum, RIGHT greater than LEFT. A single locule of gas is identified in the MEDIAL aspect of the RIGHT hemiscrotum, associated with inflammatory changes. Findings are suspicious for early infection by gas-forming organisms. Appearance of the penis is unremarkable. Other:  No ascites. Anterior abdominal wall is unremarkable. Musculoskeletal: Degenerative changes are seen in the LOWER lumbar spine. No suspicious lytic or blastic lesions are identified. IMPRESSION: 1. Gas and inflammatory changes within the RIGHT hemiscrotum, suspicious  for developing Fournier's gangrene. 2. Small bilateral inguinal lymph nodes, largest on the RIGHT measuring 1.4 centimeters in short axis, likely reactive. 3. Normal appendix. 4. Degenerative changes in the LOWER lumbar spine. These results were called by telephone at the time of interpretation on 06/05/2019 at 10:00 am to provider Vernona Rieger , who verbally acknowledged these results. Electronically Signed   By: Norva Pavlov M.D.   On: 06/05/2019 10:00    Pending Labs Unresulted Labs (From admission, onward)    Start     Ordered   06/05/19 1538  Blood Culture (routine x 2)  BLOOD CULTURE X 2,   STAT     06/05/19 1539   06/05/19 1538  Urine culture  ONCE - STAT,   STAT     06/05/19 1539   Signed and Held  HIV Antibody (routine testing w rflx)  (HIV Antibody (Routine testing w reflex) panel)  Once,   R     Signed and Held   Signed and Held  CBC  (heparin)  Once,   R    Comments: Baseline for heparin therapy IF NOT ALREADY DRAWN.  Notify MD if PLT < 100 K.    Signed and Held   Signed and Held  Basic metabolic panel  Tomorrow morning,   R     Signed and Held          Vitals/Pain Today's Vitals   06/05/19 1900 06/05/19 1930 06/05/19 2000 06/05/19 2030  BP: 137/82 125/77 (!) 152/87 (!) 155/85  Pulse: 83 87 81 80  Resp: 16 15 15 19   Temp:      SpO2: 97% 94% 98% 96%  Weight:      Height:      PainSc:        Isolation Precautions No active isolations  Medications Medications  0.9 %  sodium chloride infusion (1,000 mLs Intravenous New Bag/Given 06/05/19 1558)  vancomycin (VANCOCIN) IVPB 1000 mg/200 mL premix (0 mg Intravenous Stopped 06/05/19 1703)  piperacillin-tazobactam (ZOSYN) IVPB 3.375 g (0 g Intravenous Stopped 06/05/19 1639)    Mobility walks

## 2019-06-05 NOTE — ED Provider Notes (Signed)
[facility] DEPT Provider Note   CSN: [id number] Arrival date & time: 06/05/19  1457     History Chief Complaint  Patient presents with  . Abscess    [name] is a 44 y.o. male.  44 year old male who presents with right testicle pain x1 month which is progressive the last several days.  States that he was diagnosed with epididymitis and was on antibiotics but symptoms did not improve.  Denies any fever or dysuria.  No abdominal discomfort but has had some pain in his perineum.  Denies any history of diabetes.  Saw his doctor today who sent him for an outpatient CT which showed gas in his peritoneum suspicious for fourniers gangrene.  Sent here for further management        Past Medical History:  Diagnosis Date  . Anorectal abscess   . Chickenpox 06/17/2017  . Erythrasma 2020  . Essential hypertension 04/25/2017  . Hydradenitis   . Hyperlipidemia   . Hypertension   . Hypertriglyceridemia 04/25/2017    Patient Active Problem List   Diagnosis Date Noted  . Scrotal pain 06/05/2019  . Viral URI with cough 05/14/2019  . Cellulitis of scrotum 04/21/2019  . Preventative health care 10/24/2018  . Hidradenitis   . Chickenpox 06/17/2017  . GAD (generalized anxiety disorder) 05/09/2017  . Essential hypertension 04/25/2017  . Hypertriglyceridemia 04/25/2017    History reviewed. No pertinent surgical history.     Family History  Problem Relation Age of Onset  . Arthritis Mother   . Diabetes Mother   . Heart disease Mother   . Hyperlipidemia Mother   . Hypertension Mother   . Cancer Father        lung  . COPD Father   . Arthritis Sister   . Arthritis Maternal Grandmother   . Diabetes Maternal Grandmother   . Heart disease Maternal Grandmother   . Hyperlipidemia Maternal Grandmother   . Hypertension Maternal Grandmother   . Arthritis Maternal Grandfather   . Heart attack Maternal Grandfather   . Heart disease Maternal  Grandfather   . Hyperlipidemia Maternal Grandfather   . Hypertension Maternal Grandfather   . Arthritis Paternal Grandmother   . Melanoma Paternal Grandmother   . Hyperlipidemia Paternal Grandmother   . Hypertension Paternal Grandmother   . Arthritis Paternal Grandfather   . Diabetes Paternal Grandfather   . Heart attack Paternal Grandfather   . Hyperlipidemia Paternal Grandfather   . Hypertension Paternal Grandfather     Social History   Tobacco Use  . Smoking status: Never Smoker  . Smokeless tobacco: Never Used  Substance Use Topics  . Alcohol use: Yes  . Drug use: Never    Home Medications Prior to Admission medications   Medication Sig Start Date End Date Taking? Authorizing Provider  albuterol (VENTOLIN HFA) 108 (90 Base) MCG/ACT inhaler Inhale 2 puffs into the lungs every 4 (four) hours as needed for wheezing. 05/14/19   Tower, Wynelle Fanny, MD  B Complex-Biotin-FA (B-COMPLEX PO) Take 35 mg by mouth daily.    [provider]  benzonatate (TESSALON) 200 MG capsule Take 1 capsule (200 mg total) by mouth 3 (three) times daily as needed for cough. Swallow whole, do not bite pill Patient not taking: Reported on 06/05/2019 05/14/19   Tower, Wynelle Fanny, MD  Cholecalciferol (VITAMIN D3 PO) Take 1 capsule by mouth daily.    [provider]  escitalopram (LEXAPRO) 10 MG tablet TAKE 1 TABLET BY MOUTH EVERY DAY 12/22/18  Doreene Nest, NP  famotidine (PEPCID) 20 MG tablet Take 20 mg by mouth daily.    [provider]  fenofibrate 160 MG tablet TAKE 1 TABLET BY MOUTH ONCE DAILY FOR HIGH CHOLESTEROL. 12/22/18   Doreene Nest, NP  hydrochlorothiazide (HYDRODIURIL) 25 MG tablet Take 1 tablet (25 mg total) by mouth daily. For blood pressure. 10/24/18   Doreene Nest, NP  losartan (COZAAR) 100 MG tablet Take 1 tablet (100 mg total) by mouth daily. For blood pressure. 10/24/18   Doreene Nest, NP  Omega-3 Fatty Acids (FISH OIL PO) Take 1 capsule by mouth  daily.    [provider]  sulfamethoxazole-trimethoprim (BACTRIM DS) 800-160 MG tablet Take 1 tablet by mouth 2 (two) times daily. 06/05/19   Doreene Nest, NP    Allergies    Patient has no known allergies.  Review of Systems   Review of Systems  All other systems reviewed and are negative.   Physical Exam Updated Vital Signs BP (!) 150/82   Pulse 87   Temp 98.5 F (36.9 C)   Resp 16   Ht 1.905 m (6\' 3" )   Wt (!) 154 kg   SpO2 99%   BMI 42.43 kg/m   Physical Exam Vitals and nursing note reviewed.  Constitutional:      General: He is not in acute distress.    Appearance: Normal appearance. He is well-developed. He is not toxic-appearing.  HENT:     Head: Normocephalic and atraumatic.  Eyes:     General: Lids are normal.     Conjunctiva/sclera: Conjunctivae normal.     Pupils: Pupils are equal, round, and reactive to light.  Neck:     Thyroid: No thyroid mass.     Trachea: No tracheal deviation.  Cardiovascular:     Rate and Rhythm: Normal rate and regular rhythm.     Heart sounds: Normal heart sounds. No murmur. No gallop.   Pulmonary:     Effort: Pulmonary effort is normal. No respiratory distress.     Breath sounds: Normal breath sounds. No stridor. No decreased breath sounds, wheezing, rhonchi or rales.  Abdominal:     General: Bowel sounds are normal. There is no distension.     Palpations: Abdomen is soft.     Tenderness: There is no abdominal tenderness. There is no rebound.  Genitourinary:    Testes:        Right: Mass or swelling not present.       Comments: Tender to palpation without crepitus Musculoskeletal:        General: No tenderness. Normal range of motion.     Cervical back: Normal range of motion and neck supple.  Skin:    General: Skin is warm and dry.     Findings: No abrasion or rash.  Neurological:     Mental Status: He is alert and oriented to person, place, and time.     GCS: GCS eye subscore is 4. GCS verbal  subscore is 5. GCS motor subscore is 6.     Cranial Nerves: No cranial nerve deficit.     Sensory: No sensory deficit.  Psychiatric:        Speech: Speech normal.        Behavior: Behavior normal.     ED Results / Procedures / Treatments   Labs (all labs ordered are listed, but only abnormal results are displayed) Labs Reviewed  CULTURE, BLOOD (ROUTINE X 2)  CULTURE, BLOOD (ROUTINE X  2)  URINE CULTURE  SARS CORONAVIRUS 2 BY RT PCR (HOSPITAL ORDER, PERFORMED IN [facility])  LACTIC ACID, PLASMA  LACTIC ACID, PLASMA  COMPREHENSIVE METABOLIC PANEL  CBC WITH DIFFERENTIAL/PLATELET  APTT  PROTIME-INR  URINALYSIS, ROUTINE W REFLEX MICROSCOPIC    EKG None  Radiology CT PELVIS W CONTRAST  Result Date: 06/05/2019 CLINICAL DATA:  Unclear if this is actually cellulitis given recurrent symptoms within one month after two week treatment with antibiotics. Scrotal pain and tightness. EXAM: CT PELVIS WITH CONTRAST TECHNIQUE: Multidetector CT imaging of the pelvis was performed using the standard protocol following the bolus administration of intravenous contrast. CONTRAST:  OMNIPAQUE IOHEXOL 350 MG/ML SOLN COMPARISON:  Ultrasound 04/14/2019, CT 06/13/2017 FINDINGS: Urinary Tract: LOWER poles of both kidneys and ureters are unremarkable. The bladder is decompressed. No ureteral stone. Bowel:  Unremarkable visualized pelvic bowel loops. Normal appendix. Vascular/Lymphatic: No significant vascular abnormality seen. Small bilateral inguinal lymph nodes, largest on the RIGHT measuring 1.4 centimeters in short axis. These are likely reactive lymph nodes. Reproductive: There is asymmetric edema involving the RIGHT hemiscrotum. There is skin thickening of the scrotum, RIGHT greater than LEFT. A single locule of gas is identified in the MEDIAL aspect of the RIGHT hemiscrotum, associated with inflammatory changes. Findings are suspicious for early infection by gas-forming organisms.  Appearance of the penis is unremarkable. Other:  No ascites. Anterior abdominal wall is unremarkable. Musculoskeletal: Degenerative changes are seen in the LOWER lumbar spine. No suspicious lytic or blastic lesions are identified. IMPRESSION: 1. Gas and inflammatory changes within the RIGHT hemiscrotum, suspicious for developing Fournier's gangrene. 2. Small bilateral inguinal lymph nodes, largest on the RIGHT measuring 1.4 centimeters in short axis, likely reactive. 3. Normal appendix. 4. Degenerative changes in the LOWER lumbar spine. These results were called by telephone at the time of interpretation on 06/05/2019 at 10:00 am to provider Vernona Rieger , who verbally acknowledged these results. Electronically Signed   By: Norva Pavlov M.D.   On: 06/05/2019 10:00    Procedures Procedures (including critical care time)  Medications Ordered in ED Medications  0.9 %  sodium chloride infusion (1,000 mLs Intravenous New Bag/Given 06/05/19 1558)  vancomycin (VANCOCIN) IVPB 1000 mg/200 mL premix (has no administration in time range)  piperacillin-tazobactam (ZOSYN) IVPB 3.375 g (has no administration in time range)    ED Course  I have reviewed the triage vital signs and the nursing notes.  Pertinent labs & imaging results that were available during my care of the patient were reviewed by me and considered in my medical decision making (see chart for details).    MDM Rules/Calculators/A&P                      Patient started on IV antibiotics and discussed case with Dr. Annabell Howells on-call for urology.  He has reviewed patient's CAT scan and patient to go to the OR Final Clinical Impression(s) / ED Diagnoses Final diagnoses:  None    Rx / DC Orders ED Discharge Orders    None       Lorre Nick, MD 06/05/19 1746

## 2019-06-05 NOTE — Progress Notes (Signed)
Subjective:    Patient ID: [name], male    DOB: Jul 18, 1975, 44 y.o.   MRN: [id number]  HPI  This visit occurred during the SARS-CoV-2 public health emergency.  Safety protocols were in place, including screening questions prior to the visit, additional usage of staff PPE, and extensive cleaning of exam room while observing appropriate contact time as indicated for disinfecting solutions.   [name] is a 44 year old male with a medical history of hypertension, hidradenitis, cellulitis of scrotum, hypertriglyceridemia who presents today with a chief complaint of scrotal tightness/pain.  He was last evaluated on 04/14/19 by Webb Silversmith with complaints of scrotal pain and tightness. US of the scrotum noted scattered "gas" vs cellulitis. CT was recommended for further evaluation. He was treated with a two week course of Bactrim DS tablets at the time.   Since his last visit he completed his two week course of Bactrim DS in mid April 2021. Last week he started to notice the same symptoms return which include right sided testicular tightness/firmness with radiation to right groin. This is how he initially presented in late March 2021 when diagnosed with cellulitis.   He denies fevers, redness, swelling. Overall is pain is no worse, but no better.   Review of Systems  Constitutional: Negative for fever.  Gastrointestinal: Negative for abdominal pain.  Genitourinary: Positive for testicular pain. Negative for discharge and penile pain.       Right scrotal tightness, tenderness       Past Medical History:  Diagnosis Date  . Anorectal abscess   . Chickenpox 06/17/2017  . Erythrasma 2020  . Essential hypertension 04/25/2017  . Hyperlipidemia   . Hypertension   . Hypertriglyceridemia 04/25/2017     Social History   Socioeconomic History  . Marital status: Married    Spouse name: Not on file  . Number of children: Not on file  . Years of education: Not on file  .  Highest education level: Not on file  Occupational History  . Not on file  Tobacco Use  . Smoking status: Never Smoker  . Smokeless tobacco: Never Used  Substance and Sexual Activity  . Alcohol use: Yes  . Drug use: Never  . Sexual activity: Not on file  Other Topics Concern  . Not on file  Social History Narrative   Married.   No children.   Works as Investment banker, operational.    Enjoys playing guitar, lifting weights, walking.   Social Determinants of Health   Financial Resource Strain:   . Difficulty of Paying Living Expenses:   Food Insecurity:   . Worried About Charity fundraiser in the Last Year:   . Arboriculturist in the Last Year:   Transportation Needs:   . Film/video editor (Medical):   Marland Kitchen Lack of Transportation (Non-Medical):   Physical Activity:   . Days of Exercise per Week:   . Minutes of Exercise per Session:   Stress:   . Feeling of Stress :   Social Connections:   . Frequency of Communication with Friends and Family:   . Frequency of Social Gatherings with Friends and Family:   . Attends Religious Services:   . Active Member of Clubs or Organizations:   . Attends Archivist Meetings:   Marland Kitchen Marital Status:   Intimate Partner Violence:   . Fear of Current or Ex-Partner:   . Emotionally Abused:   Marland Kitchen Physically Abused:   . Sexually Abused:  No past surgical history on file.  Family History  Problem Relation Age of Onset  . Arthritis Mother   . Diabetes Mother   . Heart disease Mother   . Hyperlipidemia Mother   . Hypertension Mother   . Cancer Father        lung  . COPD Father   . Arthritis Sister   . Arthritis Maternal Grandmother   . Diabetes Maternal Grandmother   . Heart disease Maternal Grandmother   . Hyperlipidemia Maternal Grandmother   . Hypertension Maternal Grandmother   . Arthritis Maternal Grandfather   . Heart attack Maternal Grandfather   . Heart disease Maternal Grandfather   . Hyperlipidemia Maternal  Grandfather   . Hypertension Maternal Grandfather   . Arthritis Paternal Grandmother   . Melanoma Paternal Grandmother   . Hyperlipidemia Paternal Grandmother   . Hypertension Paternal Grandmother   . Arthritis Paternal Grandfather   . Diabetes Paternal Grandfather   . Heart attack Paternal Grandfather   . Hyperlipidemia Paternal Grandfather   . Hypertension Paternal Grandfather     No Known Allergies  Current Outpatient Medications on File Prior to Visit  Medication Sig Dispense Refill  . albuterol (VENTOLIN HFA) 108 (90 Base) MCG/ACT inhaler Inhale 2 puffs into the lungs every 4 (four) hours as needed for wheezing. 18 g 0  . B Complex-Biotin-FA (B-COMPLEX PO) Take 35 mg by mouth daily.    . Cholecalciferol (VITAMIN D3 PO) Take 1 capsule by mouth daily.    Marland Kitchen escitalopram (LEXAPRO) 10 MG tablet TAKE 1 TABLET BY MOUTH EVERY DAY 90 tablet 2  . famotidine (PEPCID) 20 MG tablet Take 20 mg by mouth daily.    . fenofibrate 160 MG tablet TAKE 1 TABLET BY MOUTH ONCE DAILY FOR HIGH CHOLESTEROL. 90 tablet 2  . hydrochlorothiazide (HYDRODIURIL) 25 MG tablet Take 1 tablet (25 mg total) by mouth daily. For blood pressure. 90 tablet 3  . losartan (COZAAR) 100 MG tablet Take 1 tablet (100 mg total) by mouth daily. For blood pressure. 90 tablet 3  . Omega-3 Fatty Acids (FISH OIL PO) Take 1 capsule by mouth daily.    . benzonatate (TESSALON) 200 MG capsule Take 1 capsule (200 mg total) by mouth 3 (three) times daily as needed for cough. Swallow whole, do not bite pill (Patient not taking: Reported on 06/05/2019) 30 capsule 1   No current facility-administered medications on file prior to visit.    BP 140/82   Pulse (!) 103   Temp (!) 96.2 F (35.7 C) (Temporal)   Ht 6\' 3"  (1.905 m)   Wt (!) 339 lb 8 oz (154 kg)   SpO2 98%   BMI 42.43 kg/m    Objective:   Physical Exam  Constitutional: He appears well-nourished.  Genitourinary:    Penis normal.     Right testis shows tenderness. Right  testis shows no mass and no swelling. Left testis shows no mass, no swelling and no tenderness.    Genitourinary Comments: Right testicle appears more firm than left. Tender with mild palpation, slight warmth. Chaperone present.    Skin: Skin is warm and dry. No erythema.           Assessment & Plan:

## 2019-06-05 NOTE — Telephone Encounter (Addendum)
Noted and agree. I personally spoke with the receptionist from [facility] who spoke with one of her surgeons. Her surgeon recommended ED evaluation at [facility] to see Urology.

## 2019-06-05 NOTE — ED Triage Notes (Signed)
Patient reports that he has an abscess of the right testicle. Patient states that he has been on antibiotics x 3 weeks. Patient had a CT of the scrotum today and was told to come to the ED.

## 2019-06-06 LAB — BASIC METABOLIC PANEL
Anion gap: 11 (ref 5–15)
BUN: 19 mg/dL (ref 6–20)
CO2: 24 mmol/L (ref 22–32)
Calcium: 8.3 mg/dL — ABNORMAL LOW (ref 8.9–10.3)
Chloride: 102 mmol/L (ref 98–111)
Creatinine, Ser: 0.87 mg/dL (ref 0.61–1.24)
GFR calc Af Amer: >60 mL/min (ref >60)
GFR calc non Af Amer: >60 mL/min (ref >60)
Glucose, Bld: 98 mg/dL (ref 70–99)
Potassium: 4 mmol/L (ref 3.5–5.1)
Sodium: 137 mmol/L (ref 135–145)

## 2019-06-06 LAB — HIV ANTIBODY (ROUTINE TESTING W REFLEX): HIV Screen 4th Generation wRfx: NONREACTIVE

## 2019-06-06 MED ORDER — SULFAMETHOXAZOLE-TRIMETHOPRIM 800-160 MG PO TABS: 1.0000 | ORAL_TABLET | Freq: Two times a day (BID) | ORAL | 0 refills | Status: DC

## 2019-06-06 NOTE — Discharge Instructions (Signed)
Please call if you start to have a fever >101, increase pain or swelling.

## 2019-06-06 NOTE — Discharge Summary (Signed)
Physician Discharge Summary  Patient ID: ROOSEVELT EIMERS MRN: [id number] DOB/AGE: 01-18-1975 44 y.o.  Admit date: 06/05/2019 Discharge date: 06/06/2019  Admission Diagnoses:  Cellulitis of scrotum  Discharge Diagnoses:  Principal Problem:   Cellulitis of scrotum Active Problems:   Epididymitis   Past Medical History:  Diagnosis Date  . Anorectal abscess   . Chickenpox 06/17/2017  . Erythrasma 2020  . Essential hypertension 04/25/2017  . Hydradenitis   . Hyperlipidemia   . Hypertension   . Hypertriglyceridemia 04/25/2017    Surgeries: Procedure(s): None    Consultants (if any): Treatment Team:  Bjorn Pippin, MD  Discharged Condition: Improved  Hospital Course: [name] is an 44 y.o. male who was admitted 06/05/2019 with a diagnosis of Cellulitis of scrotum and possible Fornier's gangrene but the process was felt to be chronic and not warranting of urgent exploration.  He was placed on broad spectrum antibiotics and on reexamination this morning there was residual induration but not tenderness or crepitus.   He was felt to be ready for discharge on oral antibiotics and will f/u in a month to reassess the need for exploration for the chronic inflammatory process.    He was given perioperative antibiotics:  Anti-infectives (From admission, onward)   Start     Dose/Rate Route Frequency Ordered Stop   06/06/19 1000  vancomycin (VANCOREADY) IVPB 2000 mg/400 mL     2,000 mg 200 mL/hr over 120 Minutes Intravenous Every 12 hours 06/05/19 2215     06/06/19 0000  sulfamethoxazole-trimethoprim (BACTRIM DS) 800-160 MG tablet     1 tablet Oral 2 times daily 06/06/19 0904     06/05/19 2359  piperacillin-tazobactam (ZOSYN) IVPB 3.375 g     3.375 g 12.5 mL/hr over 240 Minutes Intravenous Every 8 hours 06/05/19 2125     06/05/19 2215  vancomycin (VANCOREADY) IVPB 1500 mg/300 mL     1,500 mg 150 mL/hr over 120 Minutes Intravenous  Once 06/05/19 2210 06/06/19 0902    06/05/19 1545  vancomycin (VANCOCIN) IVPB 1000 mg/200 mL premix     1,000 mg 200 mL/hr over 60 Minutes Intravenous  Once 06/05/19 1539 06/05/19 1703   06/05/19 1545  piperacillin-tazobactam (ZOSYN) IVPB 3.375 g     3.375 g 100 mL/hr over 30 Minutes Intravenous  Once 06/05/19 1539 06/05/19 1639    .  He was given sq heparin for DVT prophylaxis.  He benefited maximally from the hospital stay and there were no complications.    Recent vital signs:  Vitals:   06/06/19 0154 06/06/19 0629  BP: (!) 159/89 (!) 143/90  Pulse: 72 80  Resp: 18 18  Temp: 97.9 F (36.6 C) 98 F (36.7 C)  SpO2: 99% 99%    Recent laboratory studies:  Lab Results  Component Value Date   HGB 14.6 06/05/2019   HGB 14.3 04/14/2019   HGB 14.7 11/15/2017   Lab Results  Component Value Date   WBC 6.6 06/05/2019   PLT 239 06/05/2019   Lab Results  Component Value Date   INR 1.0 06/05/2019   Lab Results  Component Value Date   NA 137 06/06/2019   K 4.0 06/06/2019   CL 102 06/06/2019   CO2 24 06/06/2019   BUN 19 06/06/2019   CREATININE 0.87 06/06/2019   GLUCOSE 98 06/06/2019    Discharge Medications:   Allergies as of 06/06/2019   No Known Allergies     Medication List    STOP taking these medications   benzonatate  200 MG capsule Commonly known as: TESSALON     TAKE these medications   albuterol 108 (90 Base) MCG/ACT inhaler Commonly known as: VENTOLIN HFA Inhale 2 puffs into the lungs every 4 (four) hours as needed for wheezing.   B-COMPLEX PO Take 35 mg by mouth daily.   escitalopram 10 MG tablet Commonly known as: LEXAPRO TAKE 1 TABLET BY MOUTH EVERY DAY   famotidine 20 MG tablet Commonly known as: PEPCID Take 20 mg by mouth daily.   fenofibrate 160 MG tablet TAKE 1 TABLET BY MOUTH ONCE DAILY FOR HIGH CHOLESTEROL. What changed: See the new instructions.   FISH OIL PO Take 1 capsule by mouth daily.   hydrochlorothiazide 25 MG tablet Commonly known as: HYDRODIURIL Take  1 tablet (25 mg total) by mouth daily. For blood pressure.   losartan 100 MG tablet Commonly known as: COZAAR Take 1 tablet (100 mg total) by mouth daily. For blood pressure.   sulfamethoxazole-trimethoprim 800-160 MG tablet Commonly known as: BACTRIM DS Take 1 tablet by mouth 2 (two) times daily.   VITAMIN D3 PO Take 1 capsule by mouth daily.       Diagnostic Studies: CT PELVIS W CONTRAST  Result Date: 06/05/2019 CLINICAL DATA:  Unclear if this is actually cellulitis given recurrent symptoms within one month after two week treatment with antibiotics. Scrotal pain and tightness. EXAM: CT PELVIS WITH CONTRAST TECHNIQUE: Multidetector CT imaging of the pelvis was performed using the standard protocol following the bolus administration of intravenous contrast. CONTRAST:  18mL OMNIPAQUE IOHEXOL 350 MG/ML SOLN COMPARISON:  Ultrasound 04/14/2019, CT 06/13/2017 FINDINGS: Urinary Tract: LOWER poles of both kidneys and ureters are unremarkable. The bladder is decompressed. No ureteral stone. Bowel:  Unremarkable visualized pelvic bowel loops. Normal appendix. Vascular/Lymphatic: No significant vascular abnormality seen. Small bilateral inguinal lymph nodes, largest on the RIGHT measuring 1.4 centimeters in short axis. These are likely reactive lymph nodes. Reproductive: There is asymmetric edema involving the RIGHT hemiscrotum. There is skin thickening of the scrotum, RIGHT greater than LEFT. A single locule of gas is identified in the MEDIAL aspect of the RIGHT hemiscrotum, associated with inflammatory changes. Findings are suspicious for early infection by gas-forming organisms. Appearance of the penis is unremarkable. Other:  No ascites. Anterior abdominal wall is unremarkable. Musculoskeletal: Degenerative changes are seen in the LOWER lumbar spine. No suspicious lytic or blastic lesions are identified. IMPRESSION: 1. Gas and inflammatory changes within the RIGHT hemiscrotum, suspicious for developing  Fournier's gangrene. 2. Small bilateral inguinal lymph nodes, largest on the RIGHT measuring 1.4 centimeters in short axis, likely reactive. 3. Normal appendix. 4. Degenerative changes in the LOWER lumbar spine. These results were called by telephone at the time of interpretation on 06/05/2019 at 10:00 am to provider Alma Friendly , who verbally acknowledged these results. Electronically Signed   By: Nolon Nations M.D.   On: 06/05/2019 10:00    Disposition: Discharge disposition: 01-Home or Self Care         Follow-up Information    Irine Seal, MD. Call.   Specialty: Urology Why: Please call for an appointment for 3-4 weeks from discharge Contact information: [place] [address] [phone]            Signed: Irine Seal 06/06/2019, 9:11 AM

## 2019-06-06 NOTE — Progress Notes (Signed)
Pharmacy Antibiotic Note  [name] is a 44 y.o. male admitted on 06/05/2019 with cellulitis.  Pharmacy has been consulted for Vancomycin dosing.  Plan: Vancomycin 1gm x1 and then vancomycin 1.5gm x1,  Then Vancomycin 2000 mg IV Q 12 hrs. Goal AUC 400-550. Expected AUC: 495 SCr used: 0.94 Vd: 0.5 L/kg   Height: 6\' 3"  (190.5 cm) Weight: (!) 154 kg (339 lb 8 oz) IBW/kg (Calculated) : 84.5  Temp (24hrs), Avg:97.8 F (36.6 C), Min:96.2 F (35.7 C), Max:98.5 F (36.9 C)  Recent Labs  Lab 06/05/19 0928 06/05/19 1538 06/05/19 1554  WBC  --  6.6  --   CREATININE 1.00 0.94  --   LATICACIDVEN  --   --  1.9    Estimated Creatinine Clearance: 161 mL/min (by C-G formula based on SCr of 0.94 mg/dL).    No Known Allergies  Antimicrobials this admission: Vancomycin 06/05/2019 >>   Dose adjustments this admission: -  Microbiology results: -  Thank you for allowing pharmacy to be a part of this patient's care.  06/07/2019 [name] 06/06/2019 2:17 AM

## 2019-06-06 NOTE — Progress Notes (Signed)
Patient was d/c to home with antibiotic therapy for a longer duration and a follow up with urology. Patient was alert, ambulatory and VS were stable.

## 2019-06-06 NOTE — H&P (Signed)
Urology Consult Note   Requesting Attending Physician:  Bjorn Pippin, MD Service Providing Consult: Urology  Consulting Attending: Dr. Bjorn Pippin   Reason for Consult: Right testicular pain  HPI: [name] is seen in consultation for reasons noted above at the request of Bjorn Pippin, MD for evaluation of right testicular pain.  This is a 44 y.o. male with with a prior history of hidradenitis managed at [facility], prior anorectal abscess drained in 2019, hypertension, hyperlipidemia, obesity who presents with right testicular pain.  Reports 4 weeks ago he developed what sounds like folliculitis after a long workday which may have self resolved but ultimately went on to have right testicular pain.  Had a scrotal ultrasound on 04/14/2019 the demonstrates normal right testicle with good blood flow but a enlarged and hypervascular epididymis.  Diagnosed with epididymitis at that time.  Started on antibiotics at that time which she completed.  Reports approximate 90% resolution.  Unfortunately 4 days ago redeveloped symptoms.  Interestingly, denies fevers or chills.  Also denies dysuria, hematuria, lower urinary tract symptoms.  Denies any history of diabetes.  Drinks about 6 drinks per week, no history of tobacco use or abuse.  No other illicits.  Does have a history of hidradenitis for which he has been using topical medications for.  Actually has an appointment with [facility] in several weeks.   On exam, he does have a divot in his midline posterior scrotum which she reports is always been there.  Denies any prior history of urologic issues.  Denies any prior history of scrotal surgery.  Only abscess that has ever been drained was an anorectal abscess.  He has never had prior scrotal issues.  Denies any history of STIs or STDs.   Past Medical History: Past Medical History:  Diagnosis Date  . Anorectal abscess   . Chickenpox 06/17/2017  . Erythrasma 2020  . Essential hypertension 04/25/2017   . Hydradenitis   . Hyperlipidemia   . Hypertension   . Hypertriglyceridemia 04/25/2017    Past Surgical History:  History reviewed. No pertinent surgical history.  Medication: Current Facility-Administered Medications  Medication Dose Route Frequency Provider Last Rate Last Admin  . 0.45 % NaCl with KCl 20 mEq / L infusion   Intravenous Continuous Bjorn Pippin, MD 100 mL/hr at 06/05/19 2157 New Bag at 06/05/19 2157  . 0.9 %  sodium chloride infusion  1,000 mL Intravenous Continuous Lorre Nick, MD 125 mL/hr at 06/05/19 1558 1,000 mL at 06/05/19 1558  . acetaminophen (TYLENOL) tablet 650 mg  650 mg Oral Q4H PRN Bjorn Pippin, MD      . bisacodyl (DULCOLAX) suppository 10 mg  10 mg Rectal Daily PRN Bjorn Pippin, MD      . diphenhydrAMINE (BENADRYL) injection 12.5-25 mg  12.5-25 mg Intravenous Q6H PRN Bjorn Pippin, MD       Or  . diphenhydrAMINE (BENADRYL) 12.5 MG/5ML elixir 12.5-25 mg  12.5-25 mg Oral Q6H PRN Bjorn Pippin, MD      . heparin injection 5,000 Units  5,000 Units Subcutaneous Q8H Bjorn Pippin, MD   5,000 Units at 06/06/19 [phone]  . HYDROmorphone (DILAUDID) injection 0.5-1 mg  0.5-1 mg Intravenous Q2H PRN Bjorn Pippin, MD      . ondansetron [facility]) injection 4 mg  4 mg Intravenous Q4H PRN Bjorn Pippin, MD      . oxyCODONE (Oxy IR/ROXICODONE) immediate release tablet 5 mg  5 mg Oral Q4H PRN Bjorn Pippin, MD      . piperacillin-tazobactam (ZOSYN)  IVPB 3.375 g  3.375 g Intravenous Rhodia Albright, MD 12.5 mL/hr at 06/06/19 0016 3.375 g at 06/06/19 0016  . senna-docusate (Senokot-S) tablet 1 tablet  1 tablet Oral QHS PRN Bjorn Pippin, MD      . sodium phosphate (FLEET) 7-19 GM/118ML enema 1 enema  1 enema Rectal Once PRN Bjorn Pippin, MD      . vancomycin (VANCOREADY) IVPB 2000 mg/400 mL  2,000 mg Intravenous Q12H Bjorn Pippin, MD      . zolpidem (AMBIEN) tablet 5 mg  5 mg Oral QHS PRN,MR X 1 Bjorn Pippin, MD        Allergies: No Known Allergies  Social History: Social History   Tobacco  Use  . Smoking status: Never Smoker  . Smokeless tobacco: Never Used  Substance Use Topics  . Alcohol use: Yes  . Drug use: Never    Family History Family History  Problem Relation Age of Onset  . Arthritis Mother   . Diabetes Mother   . Heart disease Mother   . Hyperlipidemia Mother   . Hypertension Mother   . Cancer Father        lung  . COPD Father   . Arthritis Sister   . Arthritis Maternal Grandmother   . Diabetes Maternal Grandmother   . Heart disease Maternal Grandmother   . Hyperlipidemia Maternal Grandmother   . Hypertension Maternal Grandmother   . Arthritis Maternal Grandfather   . Heart attack Maternal Grandfather   . Heart disease Maternal Grandfather   . Hyperlipidemia Maternal Grandfather   . Hypertension Maternal Grandfather   . Arthritis Paternal Grandmother   . Melanoma Paternal Grandmother   . Hyperlipidemia Paternal Grandmother   . Hypertension Paternal Grandmother   . Arthritis Paternal Grandfather   . Diabetes Paternal Grandfather   . Heart attack Paternal Grandfather   . Hyperlipidemia Paternal Grandfather   . Hypertension Paternal Grandfather     Review of Systems 10 systems were reviewed and are negative except as noted specifically in the HPI.  Objective   Vital signs in last 24 hours: BP (!) 143/90 (BP Location: Left Arm)   Pulse 80   Temp 98 F (36.7 C) (Oral)   Resp 18   Ht 6\' 3"  (1.905 m)   Wt (!) 154 kg   SpO2 99%   BMI 42.43 kg/m   Physical Exam General: NAD, A&O, resting, appropriate HEENT: [place]/AT, EOMI, MMM Pulmonary: Normal work of breathing Cardiovascular: HDS, adequate peripheral perfusion Abdomen: Soft, NTTP, nondistended.  Obese. GU: Somewhat buried penis with a large suprapubic fat pad, circumcised, normal urethral meatus, mild smegma.  Bilateral testicles are descended although the right testicle is somewhat high riding with induration more posterior and tenderness.  He does have a divot area in the midline of the  more posterior scrotum with some degree of induration at this location as well.  There are no signs of fistula tracts.  There is no evidence of hidradenitis with an the scrotum, inguinal creases or perineum. Extremities: warm and well perfused Neuro: Appropriate, no focal neurological deficits  Most Recent Labs: Lab Results  Component Value Date   WBC 6.6 06/05/2019   HGB 14.6 06/05/2019   HCT 43.0 06/05/2019   PLT 239 06/05/2019    Lab Results  Component Value Date   NA 137 06/06/2019   K 4.0 06/06/2019   CL 102 06/06/2019   CO2 24 06/06/2019   BUN 19 06/06/2019   CREATININE 0.87 06/06/2019   CALCIUM 8.3 (  L) 06/06/2019    IMAGING: CT PELVIS W CONTRAST  Result Date: 06/05/2019 CLINICAL DATA:  Unclear if this is actually cellulitis given recurrent symptoms within one month after two week treatment with antibiotics. Scrotal pain and tightness. EXAM: CT PELVIS WITH CONTRAST TECHNIQUE: Multidetector CT imaging of the pelvis was performed using the standard protocol following the bolus administration of intravenous contrast. CONTRAST:  160mL OMNIPAQUE IOHEXOL 350 MG/ML SOLN COMPARISON:  Ultrasound 04/14/2019, CT 06/13/2017 FINDINGS: Urinary Tract: LOWER poles of both kidneys and ureters are unremarkable. The bladder is decompressed. No ureteral stone. Bowel:  Unremarkable visualized pelvic bowel loops. Normal appendix. Vascular/Lymphatic: No significant vascular abnormality seen. Small bilateral inguinal lymph nodes, largest on the RIGHT measuring 1.4 centimeters in short axis. These are likely reactive lymph nodes. Reproductive: There is asymmetric edema involving the RIGHT hemiscrotum. There is skin thickening of the scrotum, RIGHT greater than LEFT. A single locule of gas is identified in the MEDIAL aspect of the RIGHT hemiscrotum, associated with inflammatory changes. Findings are suspicious for early infection by gas-forming organisms. Appearance of the penis is unremarkable. Other:  No  ascites. Anterior abdominal wall is unremarkable. Musculoskeletal: Degenerative changes are seen in the LOWER lumbar spine. No suspicious lytic or blastic lesions are identified. IMPRESSION: 1. Gas and inflammatory changes within the RIGHT hemiscrotum, suspicious for developing Fournier's gangrene. 2. Small bilateral inguinal lymph nodes, largest on the RIGHT measuring 1.4 centimeters in short axis, likely reactive. 3. Normal appendix. 4. Degenerative changes in the LOWER lumbar spine. These results were called by telephone at the time of interpretation on 06/05/2019 at 10:00 am to provider Alma Friendly , who verbally acknowledged these results. Electronically Signed   By: Nolon Nations M.D.   On: 06/05/2019 10:00    ------  Assessment:  44 y.o. male with history of hidradenitis, anorectal abscess, hypertension, hyperlipidemia who presents after being treated for right epididymitis proximal a month ago with recurrent right scrotal pain.  Afebrile, mild tachycardia to 103, no leukocytosis, no AKI.  Unfortunately, CT pelvis with contrast 06/05/2019 demonstrates right hemiscrotal edema, locule of gas in the medial aspect of the right hemiscrotum with inflammatory changes; also with right inguinal lymphadenopathy, 1.4 cm in short axis, favor reactive.  We had a frank discussion with the patient and the bedside regarding his prior and current symptoms.  We discussed his laboratory and imaging findings.  Given his history of hidradenitis and anorectal abscess, favor infection progressing from this source although could have had Denovo refractory epididymitis now with necrosis.  We discussed the indications for surgical intervention with scrotal exploration, possible incision and drainage versus excision and drainage.  We did discuss the risks of bleeding, infection, testicular loss, further surgeries.  Given his risk factors, previous diagnosis of epididymitis, concurrent hidradenitis, discussed  proceeding the operating room for scrotal exploration, he is agreeable.  Recommendations: Keep patient n.p.o. Covid test Plan for OR as above. Agree with broad-spectrum antibiotics, vancomycin and Zosyn   Thank you for this consult. Please contact the urology consult pager with any further questions/concerns.  See Consult note for my attestation comments.  Will monitor overnight on IV antibiotics but with the chronicity of the condition, urgent OR management is not indicated.

## 2019-06-07 LAB — URINE CULTURE: Culture: NO GROWTH

## 2019-06-08 DIAGNOSIS — N492 Inflammatory disorders of scrotum: Secondary | ICD-10-CM

## 2019-06-08 DIAGNOSIS — N5082 Scrotal pain: Secondary | ICD-10-CM

## 2019-06-10 LAB — CULTURE, BLOOD (ROUTINE X 2)
Culture: NO GROWTH
Culture: NO GROWTH
Special Requests: ADEQUATE

## 2019-06-12 MED ORDER — CEPHALEXIN 500 MG PO CAPS: 500.0000 mg | ORAL_CAPSULE | Freq: Three times a day (TID) | ORAL | 0 refills | Status: AC

## 2019-08-06 ENCOUNTER — Telehealth: Payer: Self-pay | Admitting: Plastic Surgery

## 2019-08-17 ENCOUNTER — Other Ambulatory Visit: Payer: Self-pay | Admitting: Primary Care

## 2019-08-17 DIAGNOSIS — F411 Generalized anxiety disorder: Secondary | ICD-10-CM

## 2019-08-24 ENCOUNTER — Other Ambulatory Visit: Payer: Self-pay

## 2019-08-24 ENCOUNTER — Encounter: Payer: Self-pay | Admitting: Plastic Surgery

## 2019-08-24 ENCOUNTER — Ambulatory Visit (INDEPENDENT_AMBULATORY_CARE_PROVIDER_SITE_OTHER): Payer: BC Managed Care – PPO | Admitting: Plastic Surgery

## 2019-08-24 VITALS — BP 160/100 | HR 92 | Temp 98.1°F | Ht 73.0 in | Wt 323.0 lb

## 2019-08-24 DIAGNOSIS — L732 Hidradenitis suppurativa: Secondary | ICD-10-CM | POA: Diagnosis not present

## 2019-08-24 NOTE — Progress Notes (Signed)
Referring Provider Doreene Nest, NP [address] [place],  [place] [address]   CC:  Chief Complaint  Patient presents with  . Advice Only      [name] is an 44 y.o. male.  HPI: Patient presents to discuss a scrotal wound that is draining.  Is been present for over a month.  He has a history of hidradenitis of the scrotum and buttock areas.  He has been seen at [facility] for this by dermatologist who have prescribed topical treatments.  He says he is noticed quite a bit of improvement but that the scrotal lesion is relatively new.  He has seen Dr. Annabell Howells with urology in regards to this whom I sent him to me to consider operative intervention.  Patient has not had any fevers but he is bothered by the persistent drainage from the scrotal area.  He reports that he intermittently gets bloody drainage from around his buttock.  Allergies  Allergen Reactions  . Bactrim [Sulfamethoxazole-Trimethoprim] Rash    Outpatient Encounter Medications as of 08/24/2019  Medication Sig  . albuterol (VENTOLIN HFA) 108 (90 Base) MCG/ACT inhaler Inhale 2 puffs into the lungs every 4 (four) hours as needed for wheezing.  . B Complex-Biotin-FA (B-COMPLEX PO) Take 35 mg by mouth daily.  . Cholecalciferol (VITAMIN D3 PO) Take 1 capsule by mouth daily.  Marland Kitchen escitalopram (LEXAPRO) 10 MG tablet TAKE 1 TABLET BY MOUTH EVERY DAY  . famotidine (PEPCID) 20 MG tablet Take 20 mg by mouth daily.  . fenofibrate 160 MG tablet TAKE 1 TABLET BY MOUTH ONCE DAILY FOR HIGH CHOLESTEROL. (Patient taking differently: Take 160 mg by mouth daily. Take 1 tablet by mouth once daily for high cholesterol.)  . hydrochlorothiazide (HYDRODIURIL) 25 MG tablet Take 1 tablet (25 mg total) by mouth daily. For blood pressure.  Marland Kitchen losartan (COZAAR) 100 MG tablet Take 1 tablet (100 mg total) by mouth daily. For blood pressure.  . Omega-3 Fatty Acids (FISH OIL PO) Take 1 capsule by mouth daily.   No facility-administered encounter  medications on file as of 08/24/2019.     Past Medical History:  Diagnosis Date  . Anorectal abscess   . Chickenpox 06/17/2017  . Erythrasma 2020  . Essential hypertension 04/25/2017  . Hydradenitis   . Hyperlipidemia   . Hypertension   . Hypertriglyceridemia 04/25/2017    No past surgical history on file.  Family History  Problem Relation Age of Onset  . Arthritis Mother   . Diabetes Mother   . Heart disease Mother   . Hyperlipidemia Mother   . Hypertension Mother   . Cancer Father        lung  . COPD Father   . Arthritis Sister   . Arthritis Maternal Grandmother   . Diabetes Maternal Grandmother   . Heart disease Maternal Grandmother   . Hyperlipidemia Maternal Grandmother   . Hypertension Maternal Grandmother   . Arthritis Maternal Grandfather   . Heart attack Maternal Grandfather   . Heart disease Maternal Grandfather   . Hyperlipidemia Maternal Grandfather   . Hypertension Maternal Grandfather   . Arthritis Paternal Grandmother   . Melanoma Paternal Grandmother   . Hyperlipidemia Paternal Grandmother   . Hypertension Paternal Grandmother   . Arthritis Paternal Grandfather   . Diabetes Paternal Grandfather   . Heart attack Paternal Grandfather   . Hyperlipidemia Paternal Grandfather   . Hypertension Paternal Grandfather     Social History   Social History Narrative  Married.   No children.   Works as Medical illustrator.    Enjoys playing guitar, lifting weights, walking.  Denies tobacco use  Review of Systems General: Denies fevers, chills, weight loss CV: Denies chest pain, shortness of breath, palpitations  Physical Exam Vitals with BMI 08/24/2019 06/06/2019 06/06/2019  Height 6\' 1"  - -  Weight 323 lbs - -  BMI 42.62 - -  Systolic 160 143  Diastolic 100 90 89  Pulse 92 80 72    General:  No acute distress,  Alert and oriented, Non-Toxic, Normal speech and affect Examination of the scrotum shows approximately 3 x 2 cm area of excoriation and  ulcerative area.  There is no fluctuance or purulence that I can detect.  The area is minimally tender.  Examination of the perianal area shows inflamed perianal skin with some raw areas.  I do not detect any subcutaneous fluctuance or fluid collections.  Assessment/Plan Patient presents with suspected hidradenitis along with a chronic wound in the scrotal area.  This wound is relatively narrow with quite a bit of what appears to be normal scrotal skin surrounding it.  I suspect that it could be excised and closed primarily.  I will plan to discuss this with Dr. [id number] and developed a more specific plan which will relate to the patient.  He is in favor of moving forward with any sort of intervention as he is particularly bothered by the drainage of this and is hopeful to get it taken care of.  All of his questions were answered.  Annabell Howells 08/24/2019, 12:51 PM

## 2019-09-07 ENCOUNTER — Other Ambulatory Visit: Payer: Self-pay | Admitting: Primary Care

## 2019-09-07 DIAGNOSIS — E781 Pure hyperglyceridemia: Secondary | ICD-10-CM

## 2019-10-07 ENCOUNTER — Other Ambulatory Visit: Payer: Self-pay | Admitting: Primary Care

## 2019-10-07 DIAGNOSIS — I10 Essential (primary) hypertension: Secondary | ICD-10-CM

## 2019-10-19 ENCOUNTER — Other Ambulatory Visit: Payer: BC Managed Care – PPO

## 2019-10-19 DIAGNOSIS — Z20822 Contact with and (suspected) exposure to covid-19: Secondary | ICD-10-CM

## 2019-10-20 LAB — NOVEL CORONAVIRUS, NAA: SARS-CoV-2, NAA: DETECTED — AB

## 2019-10-20 LAB — SARS-COV-2, NAA 2 DAY TAT

## 2019-10-21 ENCOUNTER — Other Ambulatory Visit: Payer: Self-pay | Admitting: Primary Care

## 2019-10-21 ENCOUNTER — Telehealth: Payer: Self-pay | Admitting: Internal Medicine

## 2019-10-21 DIAGNOSIS — U071 COVID-19: Secondary | ICD-10-CM

## 2019-10-21 NOTE — Telephone Encounter (Signed)
Called to Discuss with patient about Covid symptoms and the use of the monoclonal antibody infusion for those with mild to moderate Covid symptoms and at a high risk of hospitalization.     Pt is qualified for this infusion due to co-morbid conditions and/or a member of an at-risk group.  Pt c hx of htn and BMI 42.   Patient Active Problem List   Diagnosis Date Noted  . Scrotal pain 06/05/2019  . Epididymitis 06/05/2019  . Viral URI with cough 05/14/2019  . Cellulitis of scrotum 04/21/2019  . Preventative health care 10/24/2018  . Hidradenitis   . Chickenpox 06/17/2017  . GAD (generalized anxiety disorder) 05/09/2017  . Essential hypertension 04/25/2017  . Hypertriglyceridemia 04/25/2017    Patient declines infusion at this time. Symptoms tier reviewed as well as criteria for ending isolation. Preventative practices reviewed. Patient verbalized understanding.    Patient advised to call back if he/she decides that he/she does want to get infusion. Callback number to the infusion center given. Patient advised to go to Urgent care or ED with severe symptoms. Last day eligible for infusion would be 10/10.   Cyndee Brightly, NP [facility]

## 2019-10-21 NOTE — Progress Notes (Signed)
I connected by phone with [name] on 10/21/2019 at 1:58 PM to discuss the potential use of a new treatment for mild to moderate COVID-19 viral infection in non-hospitalized patients.  This patient is a 44 y.o. male that meets the FDA criteria for Emergency Use Authorization of COVID monoclonal antibody casirivimab/imdevimab or bamlanivimab/eteseviamb.  Has a (+) direct SARS-CoV-2 viral test result  Has mild or moderate COVID-19   Is NOT hospitalized due to COVID-19  Is within 10 days of symptom onset  Has at least one of the high risk factor(s) for progression to severe COVID-19 and/or hospitalization as defined in EUA.  Specific high risk criteria : BMI > 25 and Cardiovascular disease or hypertension   I have spoken and communicated the following to the patient or parent/caregiver regarding COVID monoclonal antibody treatment:  1. FDA has authorized the emergency use for the treatment of mild to moderate COVID-19 in adults and pediatric patients with positive results of direct SARS-CoV-2 viral testing who are 55 years of age and older weighing at least 40 kg, and who are at high risk for progressing to severe COVID-19 and/or hospitalization.  2. The significant known and potential risks and benefits of COVID monoclonal antibody, and the extent to which such potential risks and benefits are unknown.  3. Information on available alternative treatments and the risks and benefits of those alternatives, including clinical trials.  4. Patients treated with COVID monoclonal antibody should continue to self-isolate and use infection control measures (e.g., wear mask, isolate, social distance, avoid sharing personal items, clean and disinfect "high touch" surfaces, and frequent handwashing) according to CDC guidelines.   5. The patient or parent/caregiver has the option to accept or refuse COVID monoclonal antibody treatment.  After reviewing this information with the patient, the  patient has agreed to receive one of the available covid 19 monoclonal antibodies and will be provided an appropriate fact sheet prior to infusion. Doreene Nest, NP 10/21/2019 1:58 PM

## 2019-10-22 ENCOUNTER — Ambulatory Visit (HOSPITAL_COMMUNITY)
Admission: RE | Admit: 2019-10-22 | Discharge: 2019-10-22 | Disposition: A | Discharge status: Home / Self Care | Payer: BC Managed Care – PPO | Source: Ambulatory Visit | Attending: Pulmonary Disease | Admitting: Pulmonary Disease

## 2019-10-22 DIAGNOSIS — U071 COVID-19: Secondary | ICD-10-CM | POA: Diagnosis not present

## 2019-10-22 MED ORDER — FAMOTIDINE IN NACL 20-0.9 MG/50ML-% IV SOLN: 20.0000 mg | Freq: Once | INTRAVENOUS | Status: DC | PRN

## 2019-10-22 MED ORDER — EPINEPHRINE 0.3 MG/0.3ML IJ SOAJ: 0.3000 mg | Freq: Once | INTRAMUSCULAR | Status: DC | PRN

## 2019-10-22 MED ORDER — SODIUM CHLORIDE 0.9 % IV SOLN: INTRAVENOUS | Status: DC | PRN

## 2019-10-22 MED ORDER — ALBUTEROL SULFATE HFA 108 (90 BASE) MCG/ACT IN AERS: 2.0000 | INHALATION_SPRAY | Freq: Once | RESPIRATORY_TRACT | Status: DC | PRN

## 2019-10-22 MED ORDER — DIPHENHYDRAMINE HCL 50 MG/ML IJ SOLN: 50.0000 mg | Freq: Once | INTRAMUSCULAR | Status: DC | PRN

## 2019-10-22 MED ORDER — SODIUM CHLORIDE 0.9 % IV SOLN: Freq: Once | INTRAVENOUS | Status: AC

## 2019-10-22 MED ORDER — METHYLPREDNISOLONE SODIUM SUCC 125 MG IJ SOLR: 125.0000 mg | Freq: Once | INTRAMUSCULAR | Status: DC | PRN

## 2019-10-22 NOTE — Progress Notes (Signed)
  Diagnosis: COVID-19  Physician:Dr Wright   Procedure: Covid Infusion Clinic Med: casirivimab\imdevimab infusion - Provided patient with casirivimab\imdevimab fact sheet for patients, parents and caregivers prior to infusion.  Complications: No immediate complications noted.  Discharge: Discharged home   [name] 10/22/2019  

## 2019-10-22 NOTE — Discharge Instructions (Signed)

## 2019-11-28 ENCOUNTER — Other Ambulatory Visit: Payer: Self-pay | Admitting: Primary Care

## 2019-11-28 DIAGNOSIS — F411 Generalized anxiety disorder: Secondary | ICD-10-CM

## 2019-12-07 ENCOUNTER — Other Ambulatory Visit: Payer: Self-pay | Admitting: Primary Care

## 2019-12-07 DIAGNOSIS — E781 Pure hyperglyceridemia: Secondary | ICD-10-CM

## 2020-02-05 NOTE — Telephone Encounter (Signed)
Called patient have made my chart for Monday. Verified not suicidal thoughts. If does start to have any aware to call 911.

## 2020-02-08 ENCOUNTER — Telehealth: Payer: BC Managed Care – PPO | Admitting: Primary Care

## 2020-03-04 ENCOUNTER — Other Ambulatory Visit: Payer: Self-pay | Admitting: Primary Care

## 2020-03-04 DIAGNOSIS — E781 Pure hyperglyceridemia: Secondary | ICD-10-CM

## 2020-03-05 NOTE — Telephone Encounter (Signed)
Last refill sent with message that needed app. I have called and l/m to make app. Can you see if you can get him?

## 2020-03-07 NOTE — Telephone Encounter (Signed)
Viewed schedule and patient made appt for 2/25. Please advise.

## 2020-03-11 ENCOUNTER — Telehealth: Payer: Self-pay

## 2020-03-11 ENCOUNTER — Ambulatory Visit: Payer: BC Managed Care – PPO | Admitting: Primary Care

## 2020-03-11 DIAGNOSIS — Z0289 Encounter for other administrative examinations: Secondary | ICD-10-CM

## 2020-03-11 NOTE — Telephone Encounter (Signed)
[facility] Night - Client Nonclinical Telephone Record AccessNurse Client [facility] Night - Client Client Site [facility] - Night Contact Type Call Who Is Calling Patient / Member / Family / Caregiver Caller Name [name] Caller Phone Number [phone] Patient Name [name] Patient DOB 1975-09-24 Call Type Message Only Information Provided Reason for Call Request to [facility] Appointment Initial Comment Caller wants to cancel his 720am appointment. Additional Comment Office hours provided. Disp. Time Disposition Final User 03/11/2020 6:10:17 AM General Information Provided Yes Leotis Shames Call Closed By: Leotis Shames Transaction Date/Time: 03/11/2020 6:08:57 AM (ET)

## 2020-04-02 ENCOUNTER — Other Ambulatory Visit: Payer: Self-pay | Admitting: Primary Care

## 2020-04-02 DIAGNOSIS — E781 Pure hyperglyceridemia: Secondary | ICD-10-CM

## 2020-04-03 NOTE — Telephone Encounter (Signed)
Patient scheduled his CPE that was scheduled for February 2022, needs to reschedule.  Once he has then notify me and I'll send in a limited supply of his medication in order to get him to his next appointment.

## 2020-04-04 NOTE — Telephone Encounter (Signed)
Called patient to schedule cpe. LVM to call back and schedule.  

## 2020-04-07 NOTE — Telephone Encounter (Signed)
Called patient to schedule cpe. LVM to call back and schedule. Letter sent. No appointment made at this time.

## 2020-04-07 NOTE — Telephone Encounter (Signed)
FYI

## 2020-04-12 ENCOUNTER — Other Ambulatory Visit: Payer: Self-pay

## 2020-04-12 ENCOUNTER — Encounter: Payer: Self-pay | Admitting: Primary Care

## 2020-04-12 ENCOUNTER — Ambulatory Visit: Payer: BC Managed Care – PPO | Admitting: Primary Care

## 2020-04-12 VITALS — BP 162/98 | HR 89 | Temp 97.8°F | Ht 73.0 in | Wt 338.5 lb

## 2020-04-12 DIAGNOSIS — F411 Generalized anxiety disorder: Secondary | ICD-10-CM | POA: Diagnosis not present

## 2020-04-12 DIAGNOSIS — Z1159 Encounter for screening for other viral diseases: Secondary | ICD-10-CM

## 2020-04-12 DIAGNOSIS — E781 Pure hyperglyceridemia: Secondary | ICD-10-CM

## 2020-04-12 DIAGNOSIS — I10 Essential (primary) hypertension: Secondary | ICD-10-CM

## 2020-04-12 DIAGNOSIS — Z0000 Encounter for general adult medical examination without abnormal findings: Secondary | ICD-10-CM

## 2020-04-12 DIAGNOSIS — L732 Hidradenitis suppurativa: Secondary | ICD-10-CM

## 2020-04-12 LAB — CBC
HCT: 42.6 % (ref 39.0–52.0)
Hemoglobin: 14.9 g/dL (ref 13.0–17.0)
MCHC: 34.9 g/dL (ref 30.0–36.0)
MCV: 91.8 fl (ref 78.0–100.0)
Platelets: 263 10*3/uL (ref 150.0–400.0)
RBC: 4.64 Mil/uL (ref 4.22–5.81)
RDW: 13.4 % (ref 11.5–15.5)
WBC: 7.6 10*3/uL (ref 4.0–10.5)

## 2020-04-12 LAB — COMPREHENSIVE METABOLIC PANEL
ALT: 25 U/L (ref 0–53)
AST: 23 U/L (ref 0–37)
Albumin: 4.4 g/dL (ref 3.5–5.2)
Alkaline Phosphatase: 43 U/L (ref 39–117)
BUN: 16 mg/dL (ref 6–23)
CO2: 28 mEq/L (ref 19–32)
Calcium: 9.9 mg/dL (ref 8.4–10.5)
Chloride: 96 mEq/L (ref 96–112)
Creatinine, Ser: 0.88 mg/dL (ref 0.40–1.50)
GFR: 104.98 mL/min (ref >60.00)
Glucose, Bld: 100 mg/dL — ABNORMAL HIGH (ref 70–99)
Potassium: 4.1 mEq/L (ref 3.5–5.1)
Sodium: 134 mEq/L — ABNORMAL LOW (ref 135–145)
Total Bilirubin: 0.6 mg/dL (ref 0.2–1.2)
Total Protein: 8.6 g/dL — ABNORMAL HIGH (ref 6.0–8.3)

## 2020-04-12 LAB — LIPID PANEL
Cholesterol: 224 mg/dL — ABNORMAL HIGH (ref 0–200)
HDL: 37.6 mg/dL — ABNORMAL LOW (ref >39.00)
NonHDL: 186.24
Total CHOL/HDL Ratio: 6
Triglycerides: 228 mg/dL — ABNORMAL HIGH (ref 0.0–149.0)
VLDL: 45.6 mg/dL — ABNORMAL HIGH (ref 0.0–40.0)

## 2020-04-12 LAB — LDL CHOLESTEROL, DIRECT: Direct LDL: 167 mg/dL

## 2020-04-12 LAB — HEMOGLOBIN A1C: Hgb A1c MFr Bld: 5.5 % (ref 4.6–6.5)

## 2020-04-12 MED ORDER — OLMESARTAN MEDOXOMIL 20 MG PO TABS: 20.0000 mg | ORAL_TABLET | Freq: Every day | ORAL | 0 refills | Status: DC

## 2020-04-12 MED ORDER — ESCITALOPRAM OXALATE 20 MG PO TABS: 20.0000 mg | ORAL_TABLET | Freq: Every day | ORAL | 3 refills | Status: DC

## 2020-04-12 MED ORDER — FENOFIBRATE 160 MG PO TABS: 160.0000 mg | ORAL_TABLET | Freq: Every day | ORAL | 3 refills | Status: DC

## 2020-04-12 NOTE — Assessment & Plan Note (Signed)
Immunizations UTD.  Discussed the importance of a healthy diet and regular exercise in order for weight loss, and to reduce the risk of any potential medical problems.  Exam today stable, Labs pending.

## 2020-04-12 NOTE — Patient Instructions (Signed)
Stop by the lab prior to leaving today. I will notify you of your results once received.   Stop taking losartan 100 mg for blood pressure. Start taking olmesartan 20 mg once daily for blood pressure.  Continue taking Hydrochlorothiazide 25 mg daily for blood pressure.   We increased your lexapro to 20 mg for anxiety.   Monitor your blood pressure and notify me if you see readings consistently at or above 130/90.  It was a pleasure to see you today!   Preventive Care 74-52 Years Old, Male Preventive care refers to lifestyle choices and visits with your health care provider that can promote health and wellness. This includes:  A yearly physical exam. This is also called an annual wellness visit.  Regular dental and eye exams.  Immunizations.  Screening for certain conditions.  Healthy lifestyle choices, such as: ? Eating a healthy diet. ? Getting regular exercise. ? Not using drugs or products that contain nicotine and tobacco. ? Limiting alcohol use. What can I expect for my preventive care visit? Physical exam Your health care provider will check your:  Height and weight. These may be used to calculate your BMI (body mass index). BMI is a measurement that tells if you are at a healthy weight.  Heart rate and blood pressure.  Body temperature.  Skin for abnormal spots. Counseling Your health care provider may ask you questions about your:  Past medical problems.  Family's medical history.  Alcohol, tobacco, and drug use.  Emotional well-being.  Home life and relationship well-being.  Sexual activity.  Diet, exercise, and sleep habits.  Work and work Astronomer.  Access to firearms. What immunizations do I need? Vaccines are usually given at various ages, according to a schedule. Your health care provider will recommend vaccines for you based on your age, medical history, and lifestyle or other factors, such as travel or where you work.   What tests do I  need? Blood tests  Lipid and cholesterol levels. These may be checked every 5 years, or more often if you are over 55 years old.  Hepatitis C test.  Hepatitis B test. Screening  Lung cancer screening. You may have this screening every year starting at age 64 if you have a 30-pack-year history of smoking and currently smoke or have quit within the past 15 years.  Prostate cancer screening. Recommendations will vary depending on your family history and other risks.  Genital exam to check for testicular cancer or hernias.  Colorectal cancer screening. ? All adults should have this screening starting at age 39 and continuing until age 34. ? Your health care provider may recommend screening at age 78 if you are at increased risk. ? You will have tests every 1-10 years, depending on your results and the type of screening test.  Diabetes screening. ? This is done by checking your blood sugar (glucose) after you have not eaten for a while (fasting). ? You may have this done every 1-3 years.  STD (sexually transmitted disease) testing, if you are at risk. Follow these instructions at home: Eating and drinking  Eat a diet that includes fresh fruits and vegetables, whole grains, lean protein, and low-fat dairy products.  Take vitamin and mineral supplements as recommended by your health care provider.  Do not drink alcohol if your health care provider tells you not to drink.  If you drink alcohol: ? Limit how much you have to 0-2 drinks a day. ? Be aware of how much alcohol is  in your drink. In the [place] one drink equals one 12 oz bottle of beer (355 mL), one 5 oz glass of wine (148 mL), or one 1 oz glass of hard liquor (44 mL).   Lifestyle  Take daily care of your teeth and gums. Brush your teeth every morning and night with fluoride toothpaste. Floss one time each day.  Stay active. Exercise for at least 30 minutes 5 or more days each week.  Do not use any products that contain  nicotine or tobacco, such as cigarettes, e-cigarettes, and chewing tobacco. If you need help quitting, ask your health care provider.  Do not use drugs.  If you are sexually active, practice safe sex. Use a condom or other form of protection to prevent STIs (sexually transmitted infections).  If told by your health care provider, take low-dose aspirin daily starting at age 14.  Find healthy ways to cope with stress, such as: ? Meditation, yoga, or listening to music. ? Journaling. ? Talking to a trusted person. ? Spending time with friends and family. Safety  Always wear your seat belt while driving or riding in a vehicle.  Do not drive: ? If you have been drinking alcohol. Do not ride with someone who has been drinking. ? When you are tired or distracted. ? While texting.  Wear a helmet and other protective equipment during sports activities.  If you have firearms in your house, make sure you follow all gun safety procedures. What's next?  Go to your health care provider once a year for an annual wellness visit.  Ask your health care provider how often you should have your eyes and teeth checked.  Stay up to date on all vaccines. This information is not intended to replace advice given to you by your health care provider. Make sure you discuss any questions you have with your health care provider. Document Revised: 09/30/2018 Document Reviewed: 12/26/2017 Elsevier Patient Education  2021 [employer].

## 2020-04-12 NOTE — Progress Notes (Signed)
Subjective:    Patient ID: [name], male    DOB: [birth year], 45 y.o.   MRN: [id number]  HPI  [name] is a very pleasant 45 y.o. male who presents today for complete physical.  He believes that he may need a dose increase of his Lexapro 10 mg. Symptoms include feeling stressed, feeling anxious, not sleeping well, a lot of stress with work. His boss is the source of a lot of his stress, fortunately his boss is retiring this week.   Immunizations: -Tetanus: 2016 -Influenza: Declines -Covid-19: Completed one vaccine  Diet: He endorses a fair diet.  Exercise: He is not exercising.   Eye exam: No recent visit Dental exam: No recent visit  BP Readings from Last 3 Encounters:  04/12/20 (!) 162/98  10/22/19 (!) 167/92  08/24/19 (!) 160/100   He is checking his BP at home which is running 130's-140's/70's-80's.    Review of Systems  Constitutional: Negative for unexpected weight change.  HENT: Negative for rhinorrhea.   Eyes: Negative for visual disturbance.  Respiratory: Negative for shortness of breath.   Cardiovascular: Negative for chest pain.  Gastrointestinal: Negative for constipation and diarrhea.  Genitourinary: Negative for difficulty urinating.  Musculoskeletal: Negative for arthralgias and myalgias.  Skin: Negative for rash.  Allergic/Immunologic: Negative for environmental allergies.  Neurological: Negative for dizziness and headaches.  Psychiatric/Behavioral: The patient is nervous/anxious.        See HPI         Past Medical History:  Diagnosis Date  . Anorectal abscess   . Chickenpox 06/17/2017  . Erythrasma 2020  . Essential hypertension 04/25/2017  . Hydradenitis   . Hyperlipidemia   . Hypertension   . Hypertriglyceridemia 04/25/2017    Social History   Socioeconomic History  . Marital status: Married    Spouse name: Not on file  . Number of children: Not on file  . Years of education: Not on file  . Highest  education level: Not on file  Occupational History  . Not on file  Tobacco Use  . Smoking status: Never Smoker  . Smokeless tobacco: Never Used  Vaping Use  . Vaping Use: Never used  Substance and Sexual Activity  . Alcohol use: Yes  . Drug use: Never  . Sexual activity: Not on file  Other Topics Concern  . Not on file  Social History Narrative   Married.   No children.   Works as Medical illustrator.    Enjoys playing guitar, lifting weights, walking.   Social Determinants of Health   Financial Resource Strain: Not on file  Food Insecurity: Not on file  Transportation Needs: Not on file  Physical Activity: Not on file  Stress: Not on file  Social Connections: Not on file  Intimate Partner Violence: Not on file    History reviewed. No pertinent surgical history.  Family History  Problem Relation Age of Onset  . Arthritis Mother   . Diabetes Mother   . Heart disease Mother   . Hyperlipidemia Mother   . Hypertension Mother   . Cancer Father        lung  . COPD Father   . Arthritis Sister   . Arthritis Maternal Grandmother   . Diabetes Maternal Grandmother   . Heart disease Maternal Grandmother   . Hyperlipidemia Maternal Grandmother   . Hypertension Maternal Grandmother   . Arthritis Maternal Grandfather   . Heart attack Maternal Grandfather   . Heart disease Maternal Grandfather   .  Hyperlipidemia Maternal Grandfather   . Hypertension Maternal Grandfather   . Arthritis Paternal Grandmother   . Melanoma Paternal Grandmother   . Hyperlipidemia Paternal Grandmother   . Hypertension Paternal Grandmother   . Arthritis Paternal Grandfather   . Diabetes Paternal Grandfather   . Heart attack Paternal Grandfather   . Hyperlipidemia Paternal Grandfather   . Hypertension Paternal Grandfather     Allergies  Allergen Reactions  . Bactrim [Sulfamethoxazole-Trimethoprim] Rash    Current Outpatient Medications on File Prior to Visit  Medication Sig Dispense  Refill  . B Complex-Biotin-FA (B-COMPLEX PO) Take 35 mg by mouth daily.    . Cholecalciferol (VITAMIN D3 PO) Take 1 capsule by mouth daily.    Marland Kitchen escitalopram (LEXAPRO) 10 MG tablet TAKE 1 TABLET BY MOUTH EVERY DAY 90 tablet 1  . famotidine (PEPCID) 20 MG tablet Take 20 mg by mouth daily.    . fenofibrate 160 MG tablet TAKE 1 TABLET BY MOUTH ONCE DAILY FOR HIGH CHOLESTEROL. 30 tablet 0  . hydrochlorothiazide (HYDRODIURIL) 25 MG tablet TAKE 1 TABLET BY MOUTH EVERY DAY FOR BLOOD PRESSURE 90 tablet 3  . losartan (COZAAR) 100 MG tablet TAKE 1 TABLET BY MOUTH EVERY DAY FOR BLOOD PRESSURE 90 tablet 3  . albuterol (VENTOLIN HFA) 108 (90 Base) MCG/ACT inhaler Inhale 2 puffs into the lungs every 4 (four) hours as needed for wheezing. 18 g 0  . Omega-3 Fatty Acids (FISH OIL PO) Take 1 capsule by mouth daily.     No current facility-administered medications on file prior to visit.    BP (!) 162/98   Pulse 89   Temp 97.8 F (36.6 C) (Temporal)   Ht 6\' 1"  (1.854 m)   Wt (!) 338 lb 8 oz (153.5 kg)   SpO2 97%   BMI 44.66 kg/m  Objective:   Physical Exam HENT:     Right Ear: Tympanic membrane and ear canal normal.     Left Ear: Tympanic membrane and ear canal normal.     Nose: Nose normal.     Right Sinus: No maxillary sinus tenderness or frontal sinus tenderness.     Left Sinus: No maxillary sinus tenderness or frontal sinus tenderness.  Eyes:     Conjunctiva/sclera: Conjunctivae normal.     Pupils: Pupils are equal, round, and reactive to light.  Neck:     Thyroid: No thyromegaly.     Vascular: No carotid bruit.  Cardiovascular:     Rate and Rhythm: Normal rate and regular rhythm.     Heart sounds: Normal heart sounds.  Pulmonary:     Effort: Pulmonary effort is normal.     Breath sounds: Normal breath sounds. No wheezing or rales.  Abdominal:     General: Bowel sounds are normal.     Palpations: Abdomen is soft.     Tenderness: There is no abdominal tenderness.  Musculoskeletal:         General: Normal range of motion.     Cervical back: Neck supple.  Skin:    General: Skin is warm and dry.  Neurological:     Mental Status: He is alert and oriented to person, place, and time.     Cranial Nerves: No cranial nerve deficit.     Deep Tendon Reflexes: Reflexes are normal and symmetric.  Psychiatric:        Mood and Affect: Mood normal.           Assessment & Plan:      This visit  occurred during the SARS-CoV-2 public health emergency.  Safety protocols were in place, including screening questions prior to the visit, additional usage of staff PPE, and extensive cleaning of exam room while observing appropriate contact time as indicated for disinfecting solutions.

## 2020-04-12 NOTE — Assessment & Plan Note (Signed)
Deteriorated. Agree to increase Lexapro to 20 mg, new Rx sent to pharmacy.  He will update.

## 2020-04-12 NOTE — Assessment & Plan Note (Signed)
Stable, no longer following with [facility]. Continue to monitor, he has no concerns today.

## 2020-04-12 NOTE — Assessment & Plan Note (Signed)
Above goal today, historically elevated during office visits.  Discussed the importance of a healthy diet and regular exercise in order for weight loss, and to reduce the risk of any potential medical problems.  Stop losartan 100 mg. Start olmesartan 20 mg once daily for BP. Continue HCTZ 25 mg daily.  He will monitor BP at home and report if readings are consistently at or above 130/90.  CMP pending.

## 2020-04-12 NOTE — Assessment & Plan Note (Signed)
Compliant to fenofibrate 160 mg, continue same. Refills sent to pharmacy.  Lipid panel pending.

## 2020-04-13 LAB — HEPATITIS C ANTIBODY
Hepatitis C Ab: NONREACTIVE
SIGNAL TO CUT-OFF: 0.01 (ref <1.00)

## 2020-04-15 ENCOUNTER — Telehealth: Payer: Self-pay | Admitting: Primary Care

## 2020-04-15 ENCOUNTER — Other Ambulatory Visit: Payer: Self-pay

## 2020-04-15 DIAGNOSIS — E781 Pure hyperglyceridemia: Secondary | ICD-10-CM

## 2020-04-15 MED ORDER — ATORVASTATIN CALCIUM 10 MG PO TABS: 10.0000 mg | ORAL_TABLET | Freq: Every day | ORAL | 2 refills | Status: DC

## 2020-04-15 NOTE — Telephone Encounter (Signed)
returning phone call from lab results

## 2020-04-15 NOTE — Telephone Encounter (Signed)
Called patient see lab note for documentation.

## 2020-04-15 NOTE — Telephone Encounter (Signed)
Returning call.

## 2020-04-16 ENCOUNTER — Other Ambulatory Visit: Payer: Self-pay | Admitting: Internal Medicine

## 2020-06-05 ENCOUNTER — Other Ambulatory Visit: Payer: Self-pay | Admitting: Primary Care

## 2020-06-05 DIAGNOSIS — F411 Generalized anxiety disorder: Secondary | ICD-10-CM

## 2020-07-10 ENCOUNTER — Other Ambulatory Visit: Payer: Self-pay | Admitting: Internal Medicine

## 2020-07-10 DIAGNOSIS — E781 Pure hyperglyceridemia: Secondary | ICD-10-CM

## 2020-07-11 ENCOUNTER — Other Ambulatory Visit: Payer: Self-pay | Admitting: Primary Care

## 2020-07-11 DIAGNOSIS — I10 Essential (primary) hypertension: Secondary | ICD-10-CM

## 2020-07-15 ENCOUNTER — Other Ambulatory Visit: Payer: Self-pay

## 2020-07-15 ENCOUNTER — Other Ambulatory Visit (INDEPENDENT_AMBULATORY_CARE_PROVIDER_SITE_OTHER): Payer: BC Managed Care – PPO

## 2020-07-15 DIAGNOSIS — E781 Pure hyperglyceridemia: Secondary | ICD-10-CM | POA: Diagnosis not present

## 2020-07-15 LAB — COMPREHENSIVE METABOLIC PANEL
ALT: 13 U/L (ref 0–53)
AST: 16 U/L (ref 0–37)
Albumin: 4.3 g/dL (ref 3.5–5.2)
Alkaline Phosphatase: 41 U/L (ref 39–117)
BUN: 31 mg/dL — ABNORMAL HIGH (ref 6–23)
CO2: 25 mEq/L (ref 19–32)
Calcium: 9.5 mg/dL (ref 8.4–10.5)
Chloride: 100 mEq/L (ref 96–112)
Creatinine, Ser: 1.21 mg/dL (ref 0.40–1.50)
GFR: 72.96 mL/min (ref >60.00)
Glucose, Bld: 98 mg/dL (ref 70–99)
Potassium: 4 mEq/L (ref 3.5–5.1)
Sodium: 137 mEq/L (ref 135–145)
Total Bilirubin: 0.4 mg/dL (ref 0.2–1.2)
Total Protein: 7.9 g/dL (ref 6.0–8.3)

## 2020-07-15 LAB — LIPID PANEL
Cholesterol: 193 mg/dL (ref 0–200)
HDL: 34.6 mg/dL — ABNORMAL LOW (ref >39.00)
Total CHOL/HDL Ratio: 6
Triglycerides: 433 mg/dL — ABNORMAL HIGH (ref 0.0–149.0)

## 2020-07-15 LAB — LDL CHOLESTEROL, DIRECT: Direct LDL: 117 mg/dL

## 2020-07-16 ENCOUNTER — Other Ambulatory Visit: Payer: Self-pay | Admitting: Primary Care

## 2020-07-16 DIAGNOSIS — E781 Pure hyperglyceridemia: Secondary | ICD-10-CM

## 2020-07-16 MED ORDER — ATORVASTATIN CALCIUM 20 MG PO TABS: 20.0000 mg | ORAL_TABLET | Freq: Every day | ORAL | 3 refills | Status: DC

## 2020-08-16 NOTE — Telephone Encounter (Signed)
Do you need to see to give note?

## 2020-10-21 ENCOUNTER — Other Ambulatory Visit: Payer: Self-pay | Admitting: Primary Care

## 2020-10-21 DIAGNOSIS — I10 Essential (primary) hypertension: Secondary | ICD-10-CM

## 2020-11-23 IMAGING — US US SCROTUM W/ DOPPLER COMPLETE
1 series · 13 of 25 positions shown · non-contrast
Comparison: None

Correlation: CT abdomen and pelvis 06/13/2017

CLINICAL DATA: RIGHT accurate inguinal pain for 6 days question
testicular torsion or hernia; history of prior soft tissue
infections in the scrotum requiring drainage

EXAM:
SCROTAL ULTRASOUND
DOPPLER ULTRASOUND OF THE TESTICLES
TECHNIQUE: Complete ultrasound examination of the testicles, epididymis, and
other scrotal structures was performed. Color and spectral Doppler
ultrasound were also utilized to evaluate blood flow to the
testicles.

[Series 1: us scrotum w/ doppler complete · 0.07mm/px · 13 of 80 slices shown]
[im 1/80]
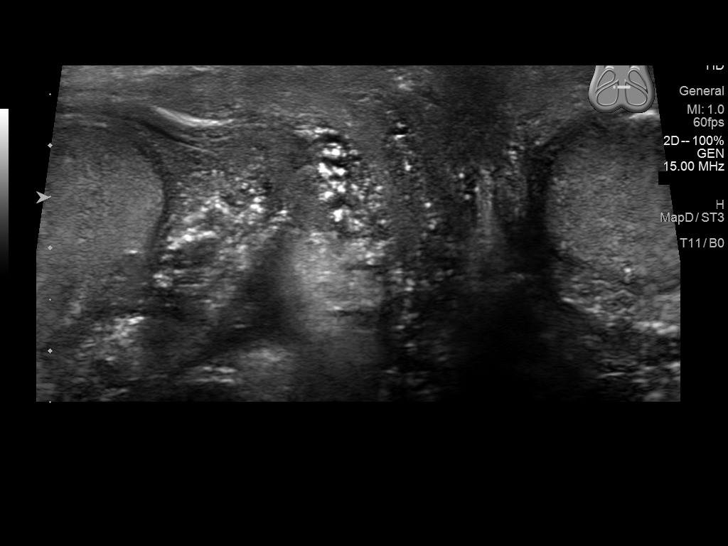
[im 7/80]
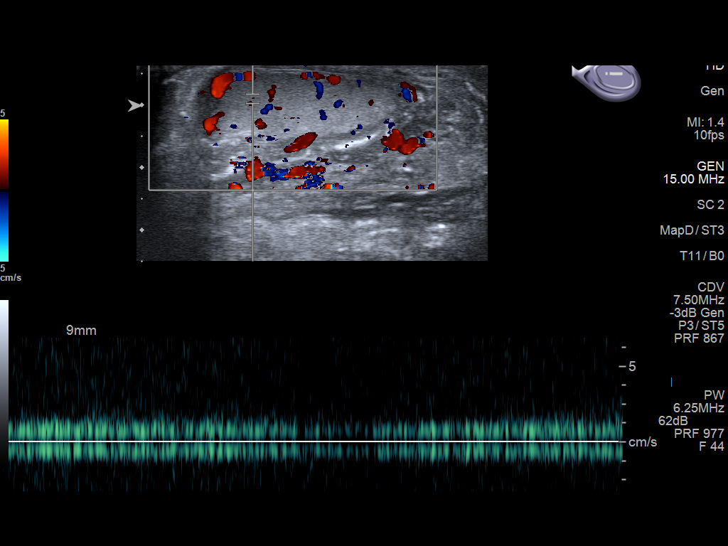
[im 14/80]
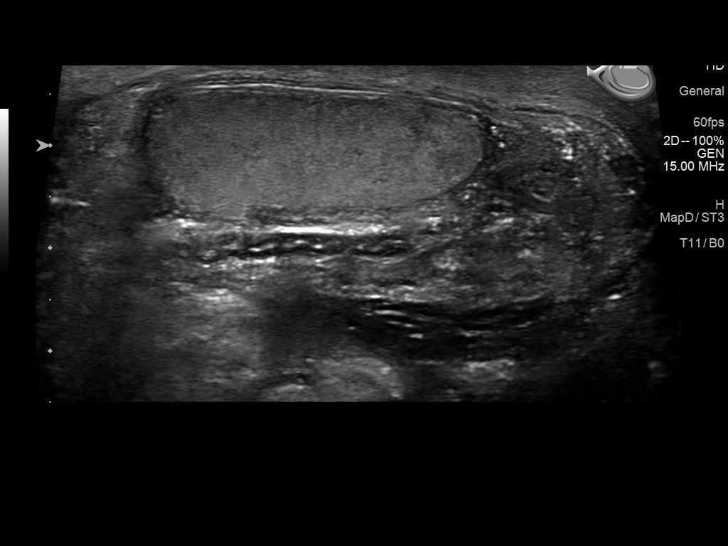
[im 20/80]
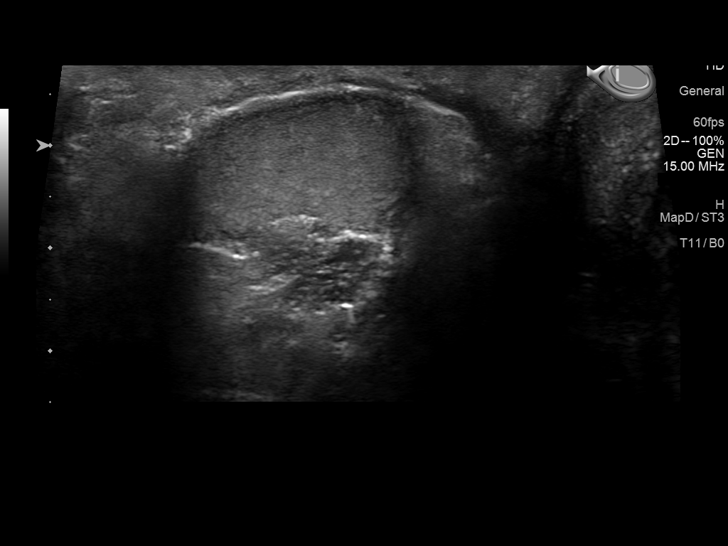
[im 27/80]
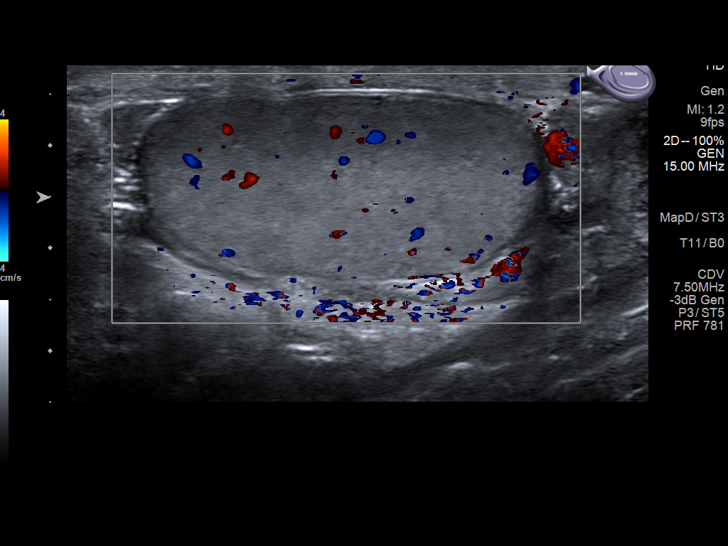
[im 33/80]
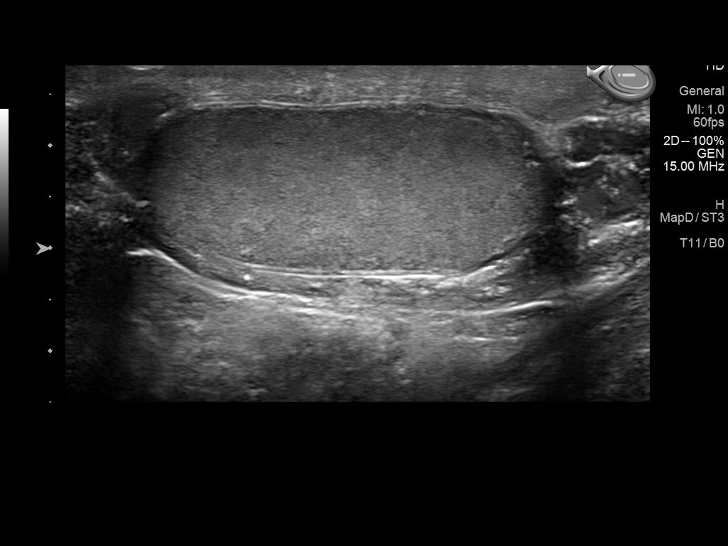
[im 40/80]
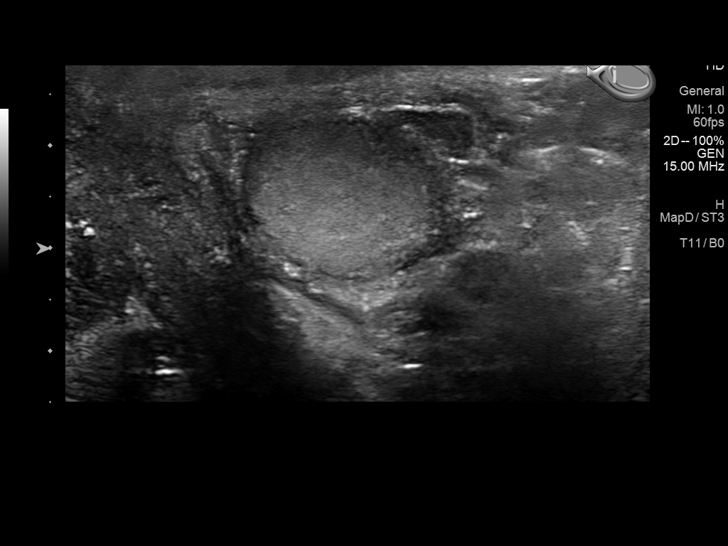
[im 47/80]
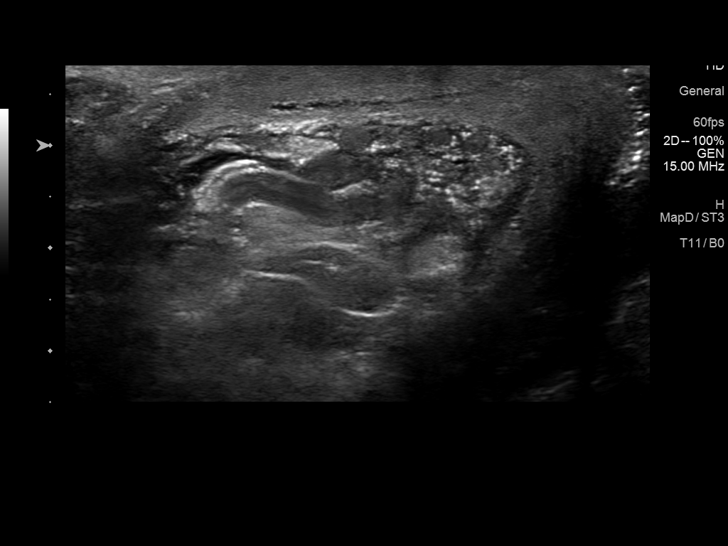
[im 53/80]
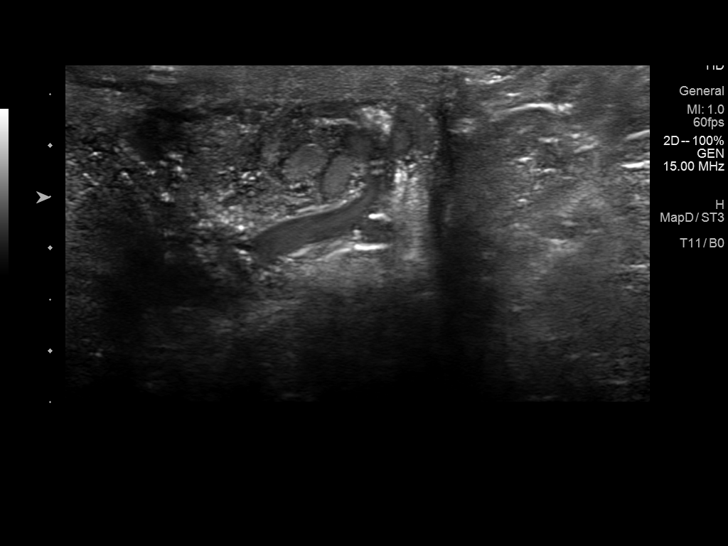
[im 60/80]
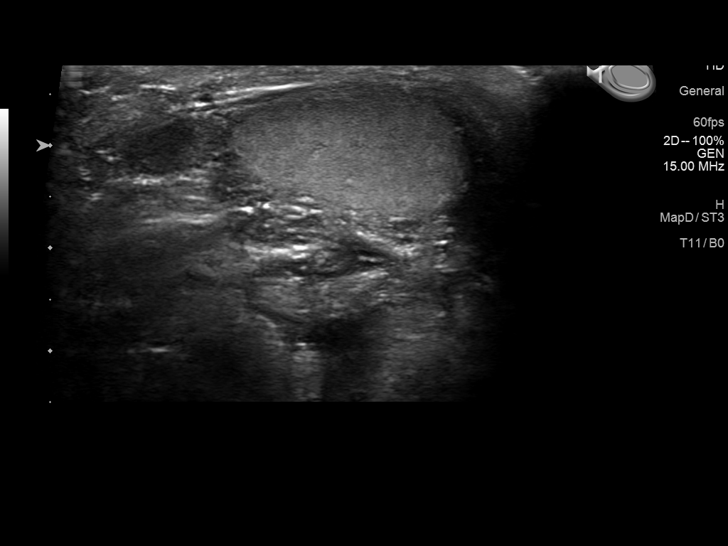
[im 66/80]
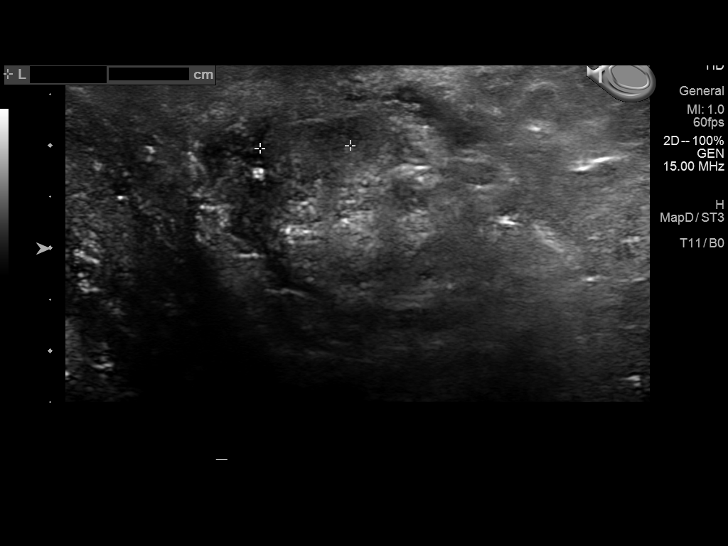
[im 73/80]
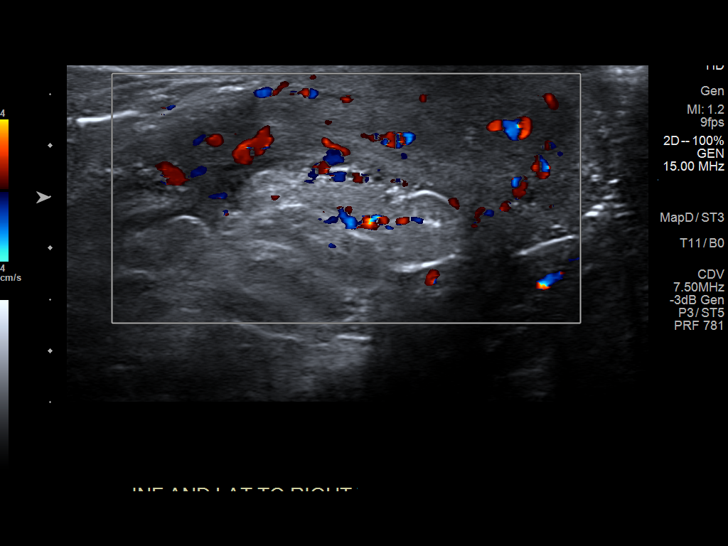
[im 80/80]
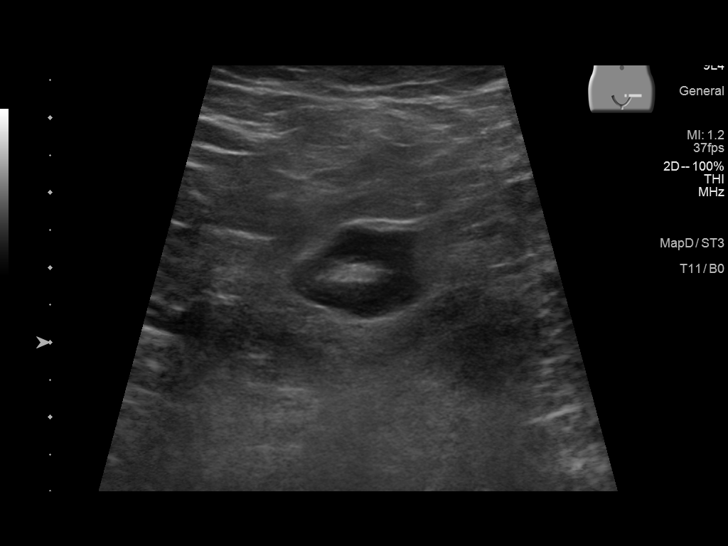

[13 of 25 positions shown; findings below may reference images not displayed]

FINDINGS: Right testicle

Measurements: 3.7 x 1.2 x 1.1 cm. Normal echogenicity without mass
or calcification. Internal blood flow present on color Doppler
imaging.

Left testicle

Measurements: 3.9 x 1.8 x 2.5 cm. Normal echogenicity without mass
or calcification. Internal blood flow present on color Doppler
imaging, question minimally less than on RIGHT.

Right epididymis: Enlarged and heterogeneous especially epididymal
tail which appears hypervascular. No focal mass.

Left epididymis:  Normal in size and appearance.

Hydrocele:  Absent bilaterally

Varicocele:  Present bilaterally

Pulsed Doppler interrogation of both testes demonstrates normal low
resistance arterial and venous waveforms bilaterally.

Significant soft tissue swelling of the RIGHT hemiscrotum with
thickening and edema of the RIGHT scrotal wall versus LEFT. The
images within PACS are high contrast and bright, creating scattered
bright echogenic artifacts; these do not have the appearance of soft
tissue gas or calcification.
IMPRESSION: No evidence of testicular mass or torsion.

Enlargement and heterogeneity of the RIGHT epididymis especially at
the tail raising question of epididymitis.

Overlying soft tissue swelling of the RIGHT hemiscrotum without
discrete abscess collection, question cellulitis.

BILATERAL varicoceles.

Scattered hyperechoic foci felt to represent nonshadowing artifacts
rather than calcification or gas scattered within the scrotum
bilaterally, not felt to represent gas as would be seen with soft
tissue infection such as Fournier gangrene; however, if patient has
persistent symptoms and clinical suspicion of progressive soft
tissue infection, may consider CT to exclude soft tissue gas.

These results will be called to the ordering clinician or
representative by the Radiologist Assistant, and communication
documented in the PACS or [REDACTED].

## 2021-01-14 IMAGING — CT CT PELVIS W/ CM
2 of 3 series · 16 of 46 positions shown, 18 images · IV contrast (APPLIED)
Comparison: Ultrasound 04/14/2019, CT 06/13/2017

CLINICAL DATA: Unclear if this is actually cellulitis given
recurrent symptoms within one month after two week treatment with
antibiotics. Scrotal pain and tightness.

EXAM:
CT PELVIS WITH CONTRAST
TECHNIQUE: Multidetector CT imaging of the pelvis was performed using the
standard protocol following the bolus administration of intravenous
contrast.
CONTRAST:  100mL OMNIPAQUE IOHEXOL 350 MG/ML SOLN

[Series 2: routine abd/pel with · axial · 0.98mm/px · z∈[-1122,-788]mm · 13 of 79 slices shown, 15 images]
[im 6/79  soft-tissue]
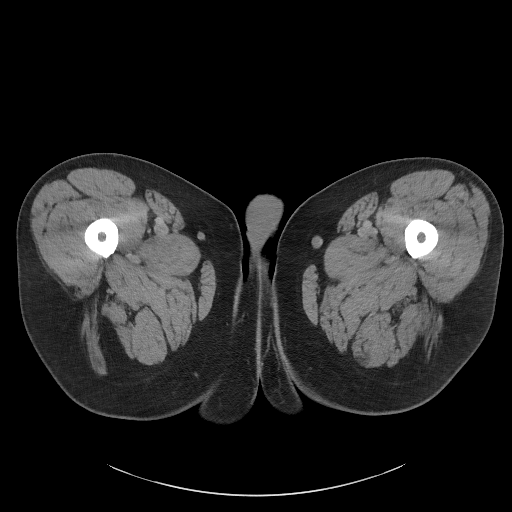
[im 6/79  bone]
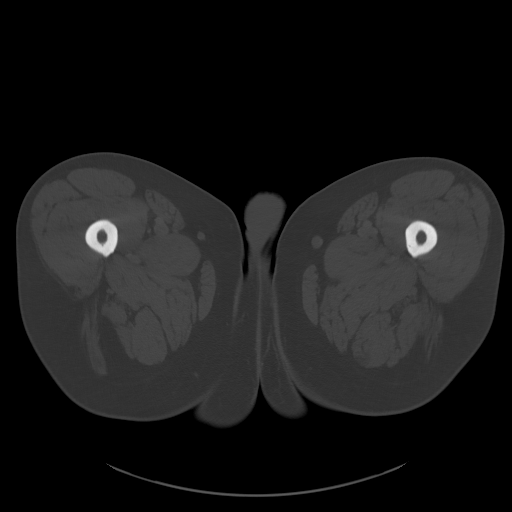
[im 11/79  soft-tissue]
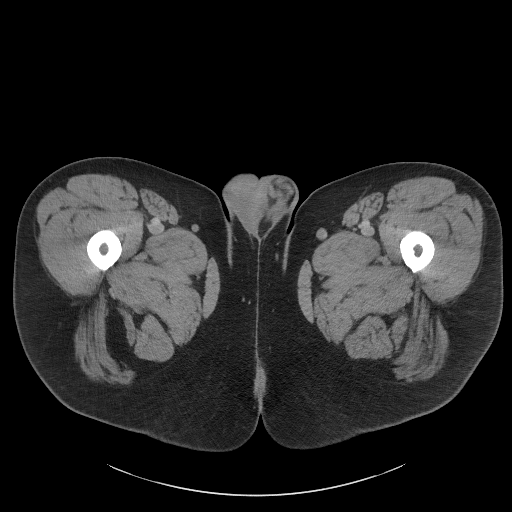
[im 16/79  soft-tissue]
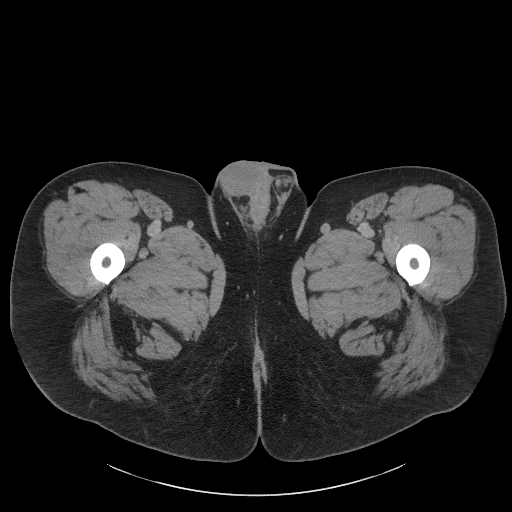
[im 23/79  soft-tissue]
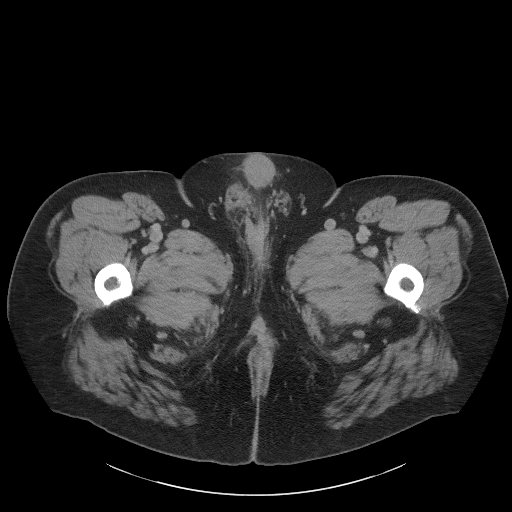
[im 28/79  soft-tissue]
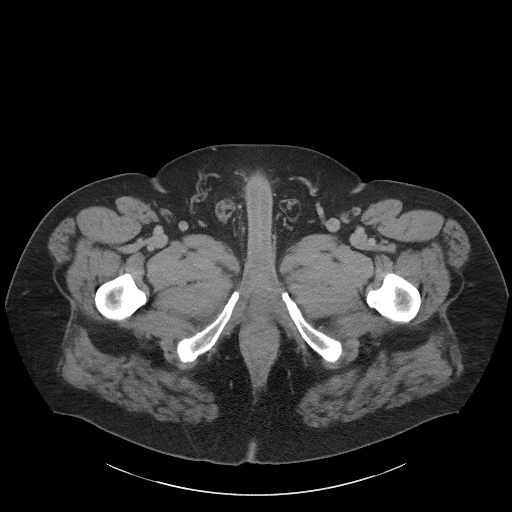
[im 33/79  soft-tissue]
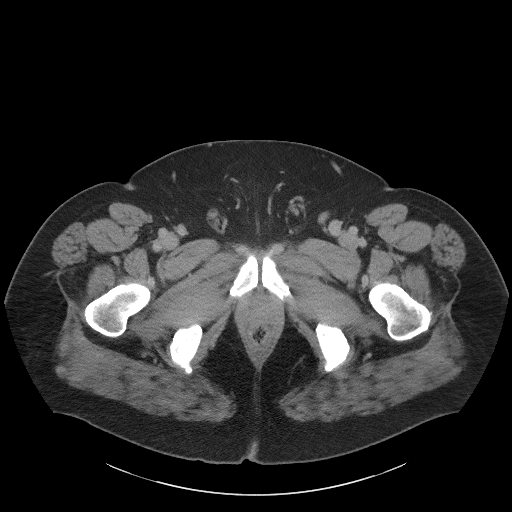
[im 41/79  soft-tissue]
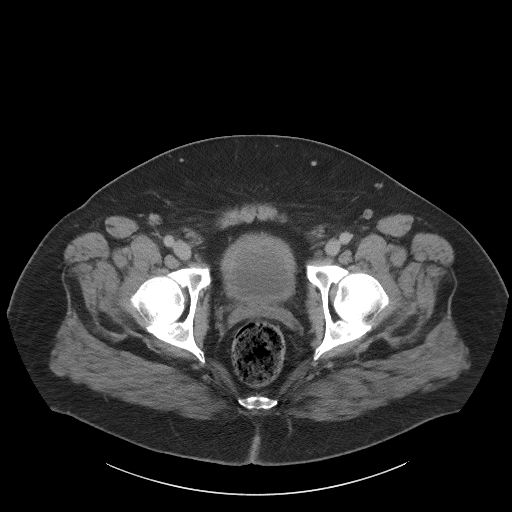
[im 46/79  soft-tissue]
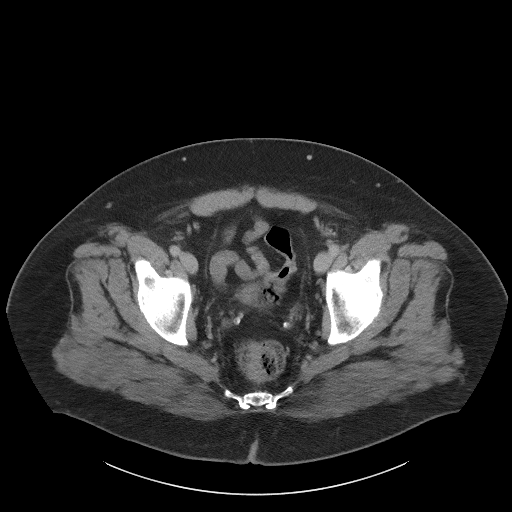
[im 51/79  soft-tissue]
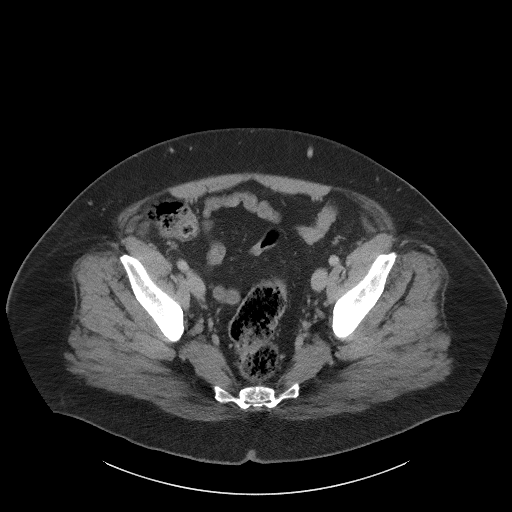
[im 51/79  bone]
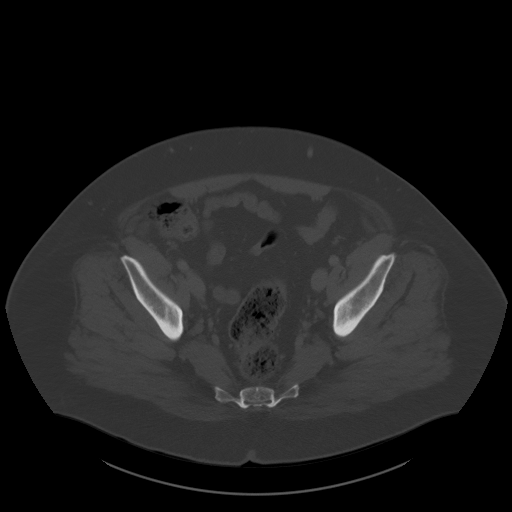
[im 56/79  soft-tissue]
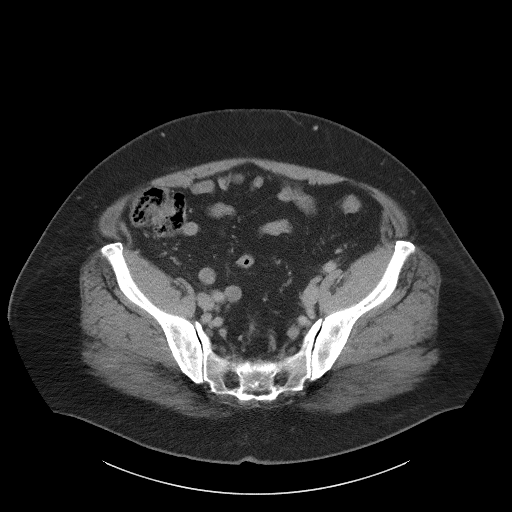
[im 63/79  soft-tissue]
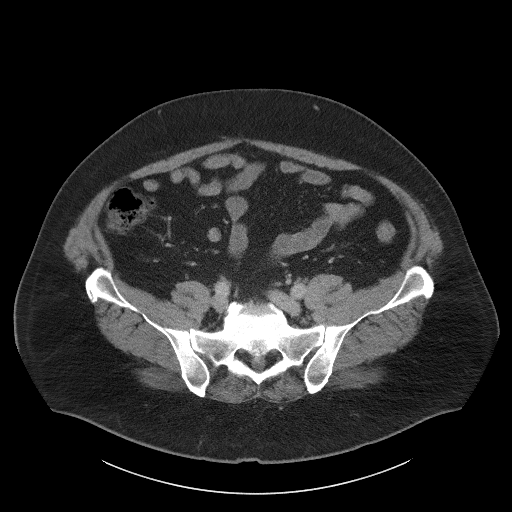
[im 68/79  soft-tissue]
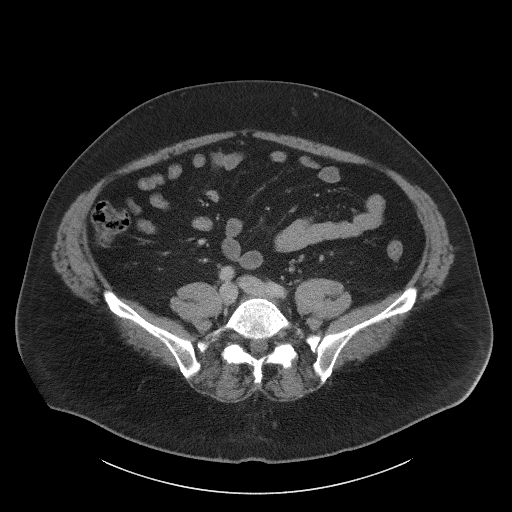
[im 73/79  soft-tissue]
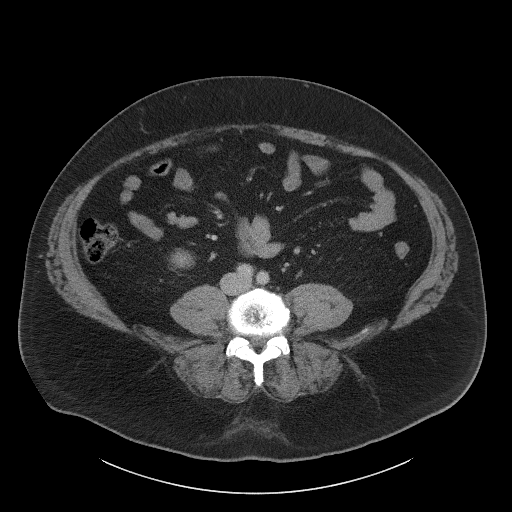

[Series 4: coronal st · coronal · 0.79mm/px · 3 of 121 slices shown]
[im 41/121  soft-tissue]
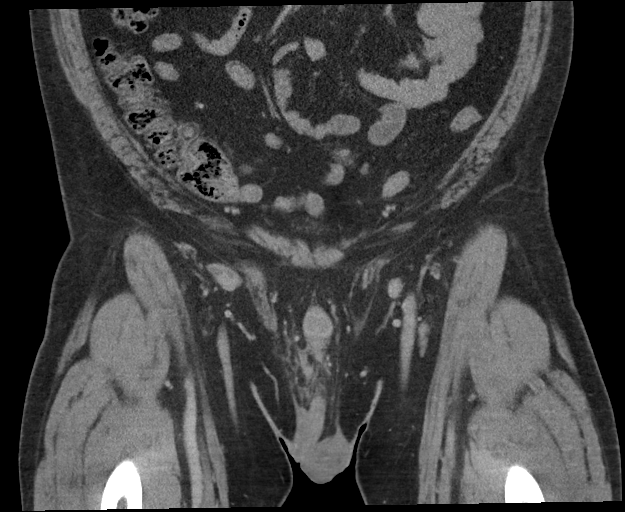
[im 54/121  soft-tissue]
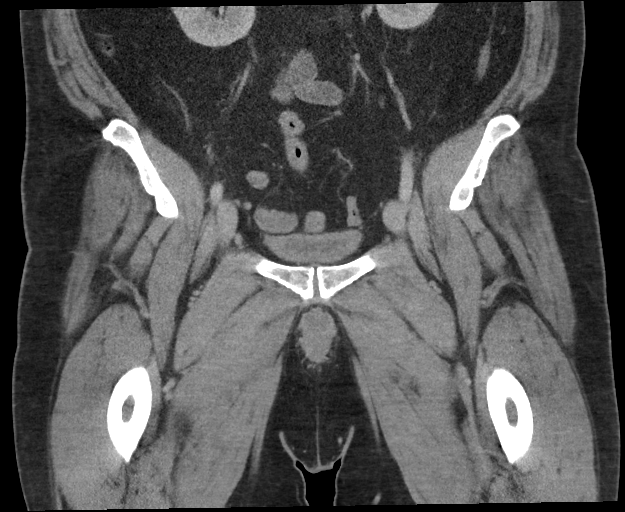
[im 67/121  soft-tissue]
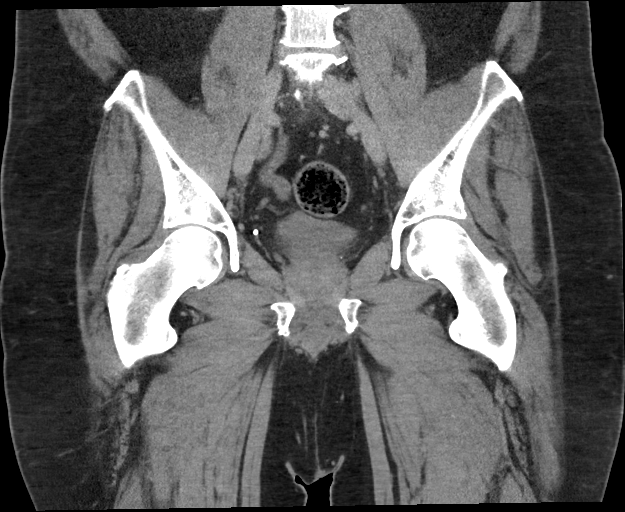

[16 of 46 positions shown; findings below may reference images not displayed]

FINDINGS: Urinary Tract: LOWER poles of both kidneys and ureters are
unremarkable. The bladder is decompressed. No ureteral stone.

Bowel:  Unremarkable visualized pelvic bowel loops. Normal appendix.

Vascular/Lymphatic: No significant vascular abnormality seen. Small
bilateral inguinal lymph nodes, largest on the RIGHT measuring
centimeters in short axis. These are likely reactive lymph nodes.

Reproductive: There is asymmetric edema involving the RIGHT
hemiscrotum. There is skin thickening of the scrotum, RIGHT greater
than LEFT. A single locule of gas is identified in the MEDIAL aspect
of the RIGHT hemiscrotum, associated with inflammatory changes.
Findings are suspicious for early infection by gas-forming
organisms. Appearance of the penis is unremarkable.

Other:  No ascites. Anterior abdominal wall is unremarkable.

Musculoskeletal: Degenerative changes are seen in the LOWER lumbar
spine. No suspicious lytic or blastic lesions are identified.
IMPRESSION: 1. Gas and inflammatory changes within the RIGHT hemiscrotum,
suspicious for developing Ardana H gangrene.
2. Small bilateral inguinal lymph nodes, largest on the RIGHT
measuring 1.4 centimeters in short axis, likely reactive.
3. Normal appendix.
4. Degenerative changes in the LOWER lumbar spine.

These results were called by telephone at the time of interpretation
on 06/05/2019 at [DATE] to provider CRNOV FALTIS , who verbally
acknowledged these results.

## 2021-04-02 ENCOUNTER — Other Ambulatory Visit: Payer: Self-pay | Admitting: Primary Care

## 2021-04-02 DIAGNOSIS — I10 Essential (primary) hypertension: Secondary | ICD-10-CM

## 2021-04-02 DIAGNOSIS — F411 Generalized anxiety disorder: Secondary | ICD-10-CM

## 2021-04-02 DIAGNOSIS — E781 Pure hyperglyceridemia: Secondary | ICD-10-CM

## 2021-04-18 ENCOUNTER — Other Ambulatory Visit: Payer: Self-pay | Admitting: Primary Care

## 2021-04-18 DIAGNOSIS — I10 Essential (primary) hypertension: Secondary | ICD-10-CM

## 2021-04-29 ENCOUNTER — Other Ambulatory Visit: Payer: Self-pay | Admitting: Primary Care

## 2021-04-29 DIAGNOSIS — E781 Pure hyperglyceridemia: Secondary | ICD-10-CM

## 2021-04-29 DIAGNOSIS — F411 Generalized anxiety disorder: Secondary | ICD-10-CM

## 2021-04-29 DIAGNOSIS — I10 Essential (primary) hypertension: Secondary | ICD-10-CM

## 2021-04-30 NOTE — Telephone Encounter (Signed)
Patient overdue for follow up/CPE.  ?Please schedule.  ?

## 2021-04-30 NOTE — Telephone Encounter (Signed)
Patient overdue for CPE/Follow up, please schedule  ?

## 2021-05-04 NOTE — Telephone Encounter (Signed)
No answer no voice mail  

## 2021-05-11 NOTE — Telephone Encounter (Signed)
Appointment has been made with patient. See previous refill request message for documentation.

## 2021-05-11 NOTE — Telephone Encounter (Signed)
Called patient left message to call office. Called wife patient was in car with her able to make CPE but could not get in until 6/7.  ?

## 2021-05-14 ENCOUNTER — Other Ambulatory Visit: Payer: Self-pay | Admitting: Primary Care

## 2021-05-14 DIAGNOSIS — I10 Essential (primary) hypertension: Secondary | ICD-10-CM

## 2021-06-01 ENCOUNTER — Other Ambulatory Visit: Payer: Self-pay | Admitting: Primary Care

## 2021-06-01 DIAGNOSIS — F411 Generalized anxiety disorder: Secondary | ICD-10-CM

## 2021-06-01 DIAGNOSIS — E781 Pure hyperglyceridemia: Secondary | ICD-10-CM

## 2021-06-01 DIAGNOSIS — I10 Essential (primary) hypertension: Secondary | ICD-10-CM

## 2021-06-15 ENCOUNTER — Other Ambulatory Visit: Payer: Self-pay | Admitting: Primary Care

## 2021-06-15 DIAGNOSIS — I10 Essential (primary) hypertension: Secondary | ICD-10-CM

## 2021-06-21 ENCOUNTER — Encounter: Payer: BC Managed Care – PPO | Admitting: Primary Care

## 2021-07-02 ENCOUNTER — Other Ambulatory Visit: Payer: Self-pay | Admitting: Primary Care

## 2021-07-02 DIAGNOSIS — E781 Pure hyperglyceridemia: Secondary | ICD-10-CM

## 2021-07-02 DIAGNOSIS — I10 Essential (primary) hypertension: Secondary | ICD-10-CM

## 2021-07-02 DIAGNOSIS — F411 Generalized anxiety disorder: Secondary | ICD-10-CM

## 2021-07-06 ENCOUNTER — Other Ambulatory Visit: Payer: Self-pay | Admitting: Primary Care

## 2021-07-06 DIAGNOSIS — F411 Generalized anxiety disorder: Secondary | ICD-10-CM

## 2021-07-06 NOTE — Telephone Encounter (Signed)
Patient is overdue for follow up. Looks like he cancelled his CPE for 06/21/21. Needs to be seen ASAP for refills of any of his medications. Let me know once he is scheduled.

## 2021-07-19 NOTE — Telephone Encounter (Signed)
Left message to return call to our office.  

## 2021-07-31 NOTE — Telephone Encounter (Signed)
Left detailed message on VM per DPR to call and schedule appt so the rx can be sent in. I sent a MyChart message, also.

## 2021-08-04 NOTE — Telephone Encounter (Signed)
Left message to return call to our office.  This is 2nd call to patient.  Has been sent my chart on 7/17

## 2021-08-08 NOTE — Telephone Encounter (Signed)
Left message to return call to our office.  This is 3rd call to patient and my chart has been sent. Please advise

## 2021-08-08 NOTE — Telephone Encounter (Signed)
error 

## 2021-09-07 ENCOUNTER — Encounter: Payer: Self-pay | Admitting: Primary Care

## 2021-09-07 ENCOUNTER — Ambulatory Visit (INDEPENDENT_AMBULATORY_CARE_PROVIDER_SITE_OTHER): Payer: BC Managed Care – PPO | Admitting: Primary Care

## 2021-09-07 VITALS — BP 180/110 | HR 78 | Temp 99.7°F | Ht 73.0 in | Wt 353.0 lb

## 2021-09-07 DIAGNOSIS — E781 Pure hyperglyceridemia: Secondary | ICD-10-CM | POA: Diagnosis not present

## 2021-09-07 DIAGNOSIS — Z0000 Encounter for general adult medical examination without abnormal findings: Secondary | ICD-10-CM

## 2021-09-07 DIAGNOSIS — Z1211 Encounter for screening for malignant neoplasm of colon: Secondary | ICD-10-CM | POA: Diagnosis not present

## 2021-09-07 DIAGNOSIS — I10 Essential (primary) hypertension: Secondary | ICD-10-CM

## 2021-09-07 DIAGNOSIS — F411 Generalized anxiety disorder: Secondary | ICD-10-CM

## 2021-09-07 MED ORDER — ESCITALOPRAM OXALATE 20 MG PO TABS: 20.0000 mg | ORAL_TABLET | Freq: Every day | ORAL | 3 refills | Status: DC

## 2021-09-07 MED ORDER — HYDROCHLOROTHIAZIDE 25 MG PO TABS: ORAL_TABLET | ORAL | 3 refills | Status: DC

## 2021-09-07 MED ORDER — OLMESARTAN MEDOXOMIL 20 MG PO TABS: ORAL_TABLET | ORAL | 3 refills | Status: DC

## 2021-09-07 NOTE — Assessment & Plan Note (Signed)
Immunizations UTD. Declines influenza vaccine. Colonoscopy due, referral placed to GI  Discussed the importance of a healthy diet and regular exercise in order for weight loss, and to reduce the risk of further co-morbidity.  Exam stable. Labs pending.  Follow up in 1 year for repeat physical.' 

## 2021-09-07 NOTE — Assessment & Plan Note (Signed)
Out of meds for 3 weeks, also atorvastatin causing myalgias.  Repeat lipid panel pending today.    Will likely resume fenofibrate 160 mg daily, consider adding Crestor.  Await results.   Discussed the importance of a healthy diet and regular exercise in order for weight loss, and to reduce the risk of further co-morbidity.

## 2021-09-07 NOTE — Patient Instructions (Signed)
Stop by the lab prior to leaving today. I will notify you of your results once received.   You will be contacted regarding your referral to GI.  Please let us know if you have not been contacted within two weeks.   Resume hydrochlorothiazide 25 mg daily for blood pressure now. Resume olmesartan 20 mg daily in one week.  Resume Lexapro 20 mg. Take 1/2 tablet daily x 2 weeks, then increase to 1 full tablet thereafter.   I will send your cholesterol medication once I have your labs back.  Please schedule a follow up visit to meet back with me in 4 weeks for blood pressure check.    It was a pleasure to see you today!   Preventive Care 45-17 Years Old, Male Preventive care refers to lifestyle choices and visits with your health care provider that can promote health and wellness. Preventive care visits are also called wellness exams. What can I expect for my preventive care visit? Counseling During your preventive care visit, your health care provider may ask about your: Medical history, including: Past medical problems. Family medical history. Current health, including: Emotional well-being. Home life and relationship well-being. Sexual activity. Lifestyle, including: Alcohol, nicotine or tobacco, and drug use. Access to firearms. Diet, exercise, and sleep habits. Safety issues such as seatbelt and bike helmet use. Sunscreen use. Work and work Astronomer. Physical exam Your health care provider will check your: Height and weight. These may be used to calculate your BMI (body mass index). BMI is a measurement that tells if you are at a healthy weight. Waist circumference. This measures the distance around your waistline. This measurement also tells if you are at a healthy weight and may help predict your risk of certain diseases, such as type 2 diabetes and high blood pressure. Heart rate and blood pressure. Body temperature. Skin for abnormal spots. What immunizations do I  need?  Vaccines are usually given at various ages, according to a schedule. Your health care provider will recommend vaccines for you based on your age, medical history, and lifestyle or other factors, such as travel or where you work. What tests do I need? Screening Your health care provider may recommend screening tests for certain conditions. This may include: Lipid and cholesterol levels. Diabetes screening. This is done by checking your blood sugar (glucose) after you have not eaten for a while (fasting). Hepatitis B test. Hepatitis C test. HIV (human immunodeficiency virus) test. STI (sexually transmitted infection) testing, if you are at risk. Lung cancer screening. Prostate cancer screening. Colorectal cancer screening. Talk with your health care provider about your test results, treatment options, and if necessary, the need for more tests. Follow these instructions at home: Eating and drinking  Eat a diet that includes fresh fruits and vegetables, whole grains, lean protein, and low-fat dairy products. Take vitamin and mineral supplements as recommended by your health care provider. Do not drink alcohol if your health care provider tells you not to drink. If you drink alcohol: Limit how much you have to 0-2 drinks a day. Know how much alcohol is in your drink. In the [place] one drink equals one 12 oz bottle of beer (355 mL), one 5 oz glass of wine (148 mL), or one 1 oz glass of hard liquor (44 mL). Lifestyle Brush your teeth every morning and night with fluoride toothpaste. Floss one time each day. Exercise for at least 30 minutes 5 or more days each week. Do not use any products that contain  nicotine or tobacco. These products include cigarettes, chewing tobacco, and vaping devices, such as e-cigarettes. If you need help quitting, ask your health care provider. Do not use drugs. If you are sexually active, practice safe sex. Use a condom or other form of protection to prevent  STIs. Take aspirin only as told by your health care provider. Make sure that you understand how much to take and what form to take. Work with your health care provider to find out whether it is safe and beneficial for you to take aspirin daily. Find healthy ways to manage stress, such as: Meditation, yoga, or listening to music. Journaling. Talking to a trusted person. Spending time with friends and family. Minimize exposure to UV radiation to reduce your risk of skin cancer. Safety Always wear your seat belt while driving or riding in a vehicle. Do not drive: If you have been drinking alcohol. Do not ride with someone who has been drinking. When you are tired or distracted. While texting. If you have been using any mind-altering substances or drugs. Wear a helmet and other protective equipment during sports activities. If you have firearms in your house, make sure you follow all gun safety procedures. What's next? Go to your health care provider once a year for an annual wellness visit. Ask your health care provider how often you should have your eyes and teeth checked. Stay up to date on all vaccines. This information is not intended to replace advice given to you by your health care provider. Make sure you discuss any questions you have with your health care provider. Document Revised: 06/29/2020 Document Reviewed: 06/29/2020 Elsevier Patient Education  2023 [employer].

## 2021-09-07 NOTE — Assessment & Plan Note (Addendum)
Uncontrolled. Out of medications for 2 to 3 weeks due to lack of follow-up.  Resume hydrochlorothiazide 25 mg daily first, then add olmesartan 20 mg daily several days later. CMP pending.  Follow up in 4 weeks for BP check.

## 2021-09-07 NOTE — Progress Notes (Signed)
Subjective:    Patient ID: [name], male    DOB: 1975/09/08, 46 y.o.   MRN: [id number]  HPI  [name] is a very pleasant 46 y.o. male who presents today for complete physical and follow up of chronic conditions.  Immunizations: -Tetanus: 2016 -Influenza: Due. Declines today  -Covid-19: Has not completed   Diet: Fair diet.  Exercise: No regular exercise.  Eye exam: Completed years ago Dental exam: Completed years ago  Colonoscopy: Never completed   BP Readings from Last 3 Encounters:  09/07/21 (!) 180/110  04/12/20 (!) 162/98  10/22/19 (!) 167/92      Review of Systems  Constitutional:  Negative for unexpected weight change.  HENT:  Negative for rhinorrhea.   Eyes:  Positive for visual disturbance.  Respiratory:  Negative for cough and shortness of breath.   Cardiovascular:  Negative for chest pain.  Gastrointestinal:  Negative for constipation and diarrhea.  Genitourinary:  Negative for difficulty urinating.  Musculoskeletal:  Negative for arthralgias and myalgias.  Skin:  Negative for rash.  Allergic/Immunologic: Negative for environmental allergies.  Neurological:  Positive for dizziness and headaches.  Psychiatric/Behavioral:  The patient is nervous/anxious.          Past Medical History:  Diagnosis Date   Anorectal abscess    Cellulitis of scrotum 04/21/2019   Chickenpox 06/17/2017   Epididymitis 06/05/2019   Erythrasma 2020   Essential hypertension 04/25/2017   Hydradenitis    Hyperlipidemia    Hypertension    Hypertriglyceridemia 04/25/2017    Social History   Socioeconomic History   Marital status: Married    Spouse name: Not on file   Number of children: Not on file   Years of education: Not on file   Highest education level: Not on file  Occupational History   Not on file  Tobacco Use   Smoking status: Never   Smokeless tobacco: Never  Vaping Use   Vaping Use: Never used  Substance and Sexual Activity    Alcohol use: Yes   Drug use: Never   Sexual activity: Not on file  Other Topics Concern   Not on file  Social History Narrative   Married.   No children.   Works as Medical illustrator.    Enjoys playing guitar, lifting weights, walking.   Social Determinants of Health   Financial Resource Strain: Not on file  Food Insecurity: Not on file  Transportation Needs: Not on file  Physical Activity: Not on file  Stress: Not on file  Social Connections: Not on file  Intimate Partner Violence: Not on file    History reviewed. No pertinent surgical history.  Family History  Problem Relation Age of Onset   Arthritis Mother    Diabetes Mother    Heart disease Mother    Hyperlipidemia Mother    Hypertension Mother    Cancer Father        lung   COPD Father    Arthritis Sister    Arthritis Maternal Grandmother    Diabetes Maternal Grandmother    Heart disease Maternal Grandmother    Hyperlipidemia Maternal Grandmother    Hypertension Maternal Grandmother    Arthritis Maternal Grandfather    Heart attack Maternal Grandfather    Heart disease Maternal Grandfather    Hyperlipidemia Maternal Grandfather    Hypertension Maternal Grandfather    Arthritis Paternal Grandmother    Melanoma Paternal Grandmother    Hyperlipidemia Paternal Grandmother    Hypertension Paternal Grandmother  Arthritis Paternal Grandfather    Diabetes Paternal Grandfather    Heart attack Paternal Grandfather    Hyperlipidemia Paternal Grandfather    Hypertension Paternal Grandfather     Allergies  Allergen Reactions   Bactrim [Sulfamethoxazole-Trimethoprim] Rash    Current Outpatient Medications on File Prior to Visit  Medication Sig Dispense Refill   B Complex-Biotin-FA (B-COMPLEX PO) Take 35 mg by mouth daily.     atorvastatin (LIPITOR) 20 MG tablet Take 1 tablet (20 mg total) by mouth daily. For cholesterol. (Patient not taking: Reported on 09/07/2021) 90 tablet 3   famotidine (PEPCID) 20 MG  tablet Take 20 mg by mouth daily. (Patient not taking: Reported on 09/07/2021)     fenofibrate 160 MG tablet Take 1 tablet (160 mg total) by mouth daily. for cholesterol. Office visit required for further refills. Final notice (Patient not taking: Reported on 09/07/2021) 30 tablet 0   No current facility-administered medications on file prior to visit.    BP (!) 180/110   Pulse 78   Temp 99.7 F (37.6 C) (Oral)   Ht 6\' 1"  (1.854 m)   Wt (!) 353 lb (160.1 kg)   SpO2 99%   BMI 46.57 kg/m  Objective:   Physical Exam HENT:     Right Ear: Tympanic membrane and ear canal normal.     Left Ear: Tympanic membrane and ear canal normal.     Nose: Nose normal.     Right Sinus: No maxillary sinus tenderness or frontal sinus tenderness.     Left Sinus: No maxillary sinus tenderness or frontal sinus tenderness.  Eyes:     Conjunctiva/sclera: Conjunctivae normal.  Neck:     Thyroid: No thyromegaly.     Vascular: No carotid bruit.  Cardiovascular:     Rate and Rhythm: Normal rate and regular rhythm.     Heart sounds: Normal heart sounds.  Pulmonary:     Effort: Pulmonary effort is normal.     Breath sounds: Normal breath sounds. No wheezing or rales.  Abdominal:     General: Bowel sounds are normal.     Palpations: Abdomen is soft.     Tenderness: There is no abdominal tenderness.  Musculoskeletal:        General: Normal range of motion.     Cervical back: Neck supple.  Skin:    General: Skin is warm and dry.  Neurological:     Mental Status: He is alert and oriented to person, place, and time.     Cranial Nerves: No cranial nerve deficit.     Deep Tendon Reflexes: Reflexes are normal and symmetric.  Psychiatric:        Mood and Affect: Mood normal.           Assessment & Plan:   Problem List Items Addressed This Visit       Cardiovascular and Mediastinum   Essential hypertension    Uncontrolled. Out of medications for 2 to 3 weeks due to lack of follow-up.  Resume  hydrochlorothiazide 25 mg daily first, then add olmesartan 20 mg daily several days later. CMP pending.  Follow up in 4 weeks for BP check.      Relevant Medications   hydrochlorothiazide (HYDRODIURIL) 25 MG tablet   olmesartan (BENICAR) 20 MG tablet   Other Relevant Orders   Comprehensive metabolic panel   Hemoglobin A1c   CBC     Other   Hypertriglyceridemia    Out of meds for 3 weeks, also atorvastatin causing myalgias.  Repeat lipid panel pending today.    Will likely resume fenofibrate 160 mg daily, consider adding Crestor.  Await results.   Discussed the importance of a healthy diet and regular exercise in order for weight loss, and to reduce the risk of further co-morbidity.       Relevant Medications   hydrochlorothiazide (HYDRODIURIL) 25 MG tablet   olmesartan (BENICAR) 20 MG tablet   Other Relevant Orders   Lipid panel   GAD (generalized anxiety disorder)    Uncontrolled, also out of meds for 3 weeks. He is controlled when taking Lexapro.   Resume Lexapro at 10 mg x 1-2 weeks, then increase to 20 mg thereafter.       Relevant Medications   escitalopram (LEXAPRO) 20 MG tablet   Preventative health care - Primary    Immunizations UTD. Declines influenza vaccine. Colonoscopy due, referral placed to GI.  Discussed the importance of a healthy diet and regular exercise in order for weight loss, and to reduce the risk of further co-morbidity.  Exam stable. Labs pending.  Follow up in 1 year for repeat physical.       Other Visit Diagnoses     Screening for colon cancer       Relevant Orders   Ambulatory referral to Gastroenterology          Doreene Nest, NP

## 2021-09-07 NOTE — Assessment & Plan Note (Signed)
Uncontrolled, also out of meds for 3 weeks. He is controlled when taking Lexapro.   Resume Lexapro at 10 mg x 1-2 weeks, then increase to 20 mg thereafter.

## 2021-09-08 ENCOUNTER — Other Ambulatory Visit: Payer: Self-pay | Admitting: Primary Care

## 2021-09-08 DIAGNOSIS — E781 Pure hyperglyceridemia: Secondary | ICD-10-CM

## 2021-09-08 LAB — COMPREHENSIVE METABOLIC PANEL
ALT: 27 U/L (ref 0–53)
AST: 28 U/L (ref 0–37)
Albumin: 4.1 g/dL (ref 3.5–5.2)
Alkaline Phosphatase: 60 U/L (ref 39–117)
BUN: 10 mg/dL (ref 6–23)
CO2: 24 mEq/L (ref 19–32)
Calcium: 9.1 mg/dL (ref 8.4–10.5)
Chloride: 98 mEq/L (ref 96–112)
Creatinine, Ser: 0.75 mg/dL (ref 0.40–1.50)
GFR: 109.09 mL/min (ref >60.00)
Glucose, Bld: 83 mg/dL (ref 70–99)
Potassium: 4.1 mEq/L (ref 3.5–5.1)
Sodium: 137 mEq/L (ref 135–145)
Total Bilirubin: 0.6 mg/dL (ref 0.2–1.2)
Total Protein: 7.9 g/dL (ref 6.0–8.3)

## 2021-09-08 LAB — HEMOGLOBIN A1C: Hgb A1c MFr Bld: 5.2 % (ref 4.6–6.5)

## 2021-09-08 LAB — LIPID PANEL
Cholesterol: 245 mg/dL — ABNORMAL HIGH (ref 0–200)
HDL: 45.2 mg/dL (ref >39.00)
Total CHOL/HDL Ratio: 5
Triglycerides: 441 mg/dL — ABNORMAL HIGH (ref 0.0–149.0)

## 2021-09-08 LAB — CBC
HCT: 43.5 % (ref 39.0–52.0)
Hemoglobin: 14.7 g/dL (ref 13.0–17.0)
MCHC: 33.8 g/dL (ref 30.0–36.0)
MCV: 95.5 fl (ref 78.0–100.0)
Platelets: 190 10*3/uL (ref 150.0–400.0)
RBC: 4.56 Mil/uL (ref 4.22–5.81)
RDW: 14.4 % (ref 11.5–15.5)
WBC: 7.9 10*3/uL (ref 4.0–10.5)

## 2021-09-08 LAB — LDL CHOLESTEROL, DIRECT: Direct LDL: 156 mg/dL

## 2021-09-08 MED ORDER — ROSUVASTATIN CALCIUM 10 MG PO TABS: 10.0000 mg | ORAL_TABLET | Freq: Every day | ORAL | 0 refills | Status: DC

## 2021-10-10 ENCOUNTER — Ambulatory Visit: Payer: BC Managed Care – PPO | Admitting: Primary Care

## 2021-12-09 ENCOUNTER — Other Ambulatory Visit: Payer: Self-pay | Admitting: Primary Care

## 2021-12-09 DIAGNOSIS — E781 Pure hyperglyceridemia: Secondary | ICD-10-CM

## 2022-08-17 ENCOUNTER — Ambulatory Visit: Payer: BC Managed Care – PPO | Admitting: Nurse Practitioner

## 2022-08-31 ENCOUNTER — Other Ambulatory Visit: Payer: Self-pay | Admitting: Primary Care

## 2022-08-31 DIAGNOSIS — F411 Generalized anxiety disorder: Secondary | ICD-10-CM

## 2022-08-31 DIAGNOSIS — I10 Essential (primary) hypertension: Secondary | ICD-10-CM

## 2022-08-31 NOTE — Telephone Encounter (Signed)
Patient is due for CPE/follow up soon, this will be required prior to any further refills. Last CPE was 09/06/21  Please schedule, thank you!

## 2022-08-31 NOTE — Telephone Encounter (Signed)
Lvmtcb, sent mychart message  

## 2022-09-19 ENCOUNTER — Other Ambulatory Visit: Payer: Self-pay | Admitting: Primary Care

## 2022-09-19 DIAGNOSIS — E781 Pure hyperglyceridemia: Secondary | ICD-10-CM

## 2022-09-22 ENCOUNTER — Other Ambulatory Visit: Payer: Self-pay | Admitting: Primary Care

## 2022-09-22 DIAGNOSIS — I10 Essential (primary) hypertension: Secondary | ICD-10-CM

## 2022-09-22 DIAGNOSIS — F411 Generalized anxiety disorder: Secondary | ICD-10-CM

## 2022-09-26 ENCOUNTER — Other Ambulatory Visit: Payer: Self-pay | Admitting: Primary Care

## 2022-09-26 DIAGNOSIS — F411 Generalized anxiety disorder: Secondary | ICD-10-CM

## 2022-09-26 DIAGNOSIS — I10 Essential (primary) hypertension: Secondary | ICD-10-CM

## 2022-10-03 ENCOUNTER — Other Ambulatory Visit: Payer: Self-pay | Admitting: Primary Care

## 2022-10-03 DIAGNOSIS — E781 Pure hyperglyceridemia: Secondary | ICD-10-CM

## 2022-10-10 ENCOUNTER — Encounter: Payer: Self-pay | Admitting: Primary Care

## 2022-10-10 ENCOUNTER — Ambulatory Visit (INDEPENDENT_AMBULATORY_CARE_PROVIDER_SITE_OTHER): Payer: PRIVATE HEALTH INSURANCE | Admitting: Primary Care

## 2022-10-10 VITALS — BP 138/70 | HR 90 | Temp 97.2°F | Ht 73.0 in | Wt 341.0 lb

## 2022-10-10 DIAGNOSIS — M7989 Other specified soft tissue disorders: Secondary | ICD-10-CM

## 2022-10-10 DIAGNOSIS — E781 Pure hyperglyceridemia: Secondary | ICD-10-CM | POA: Diagnosis not present

## 2022-10-10 DIAGNOSIS — F411 Generalized anxiety disorder: Secondary | ICD-10-CM | POA: Diagnosis not present

## 2022-10-10 DIAGNOSIS — Z1211 Encounter for screening for malignant neoplasm of colon: Secondary | ICD-10-CM

## 2022-10-10 DIAGNOSIS — Z0001 Encounter for general adult medical examination with abnormal findings: Secondary | ICD-10-CM

## 2022-10-10 DIAGNOSIS — M79674 Pain in right toe(s): Secondary | ICD-10-CM | POA: Insufficient documentation

## 2022-10-10 DIAGNOSIS — I10 Essential (primary) hypertension: Secondary | ICD-10-CM

## 2022-10-10 LAB — CBC WITH DIFFERENTIAL/PLATELET
Basophils Absolute: 0.1 10*3/uL (ref 0.0–0.1)
Basophils Relative: 0.6 % (ref 0.0–3.0)
Eosinophils Absolute: 0.2 10*3/uL (ref 0.0–0.7)
Eosinophils Relative: 1.9 % (ref 0.0–5.0)
HCT: 36.1 % — ABNORMAL LOW (ref 39.0–52.0)
Hemoglobin: 12.2 g/dL — ABNORMAL LOW (ref 13.0–17.0)
Lymphocytes Relative: 21.8 % (ref 12.0–46.0)
Lymphs Abs: 2.1 10*3/uL (ref 0.7–4.0)
MCHC: 33.7 g/dL (ref 30.0–36.0)
MCV: 93.5 fl (ref 78.0–100.0)
Monocytes Absolute: 0.9 10*3/uL (ref 0.1–1.0)
Monocytes Relative: 10.1 % (ref 3.0–12.0)
Neutro Abs: 6.2 10*3/uL (ref 1.4–7.7)
Neutrophils Relative %: 65.6 % (ref 43.0–77.0)
Platelets: 414 10*3/uL — ABNORMAL HIGH (ref 150.0–400.0)
RBC: 3.86 Mil/uL — ABNORMAL LOW (ref 4.22–5.81)
RDW: 13.9 % (ref 11.5–15.5)
WBC: 9.4 10*3/uL (ref 4.0–10.5)

## 2022-10-10 LAB — COMPREHENSIVE METABOLIC PANEL
ALT: 20 U/L (ref 0–53)
AST: 17 U/L (ref 0–37)
Albumin: 3.9 g/dL (ref 3.5–5.2)
Alkaline Phosphatase: 69 U/L (ref 39–117)
BUN: 21 mg/dL (ref 6–23)
CO2: 24 mEq/L (ref 19–32)
Calcium: 10 mg/dL (ref 8.4–10.5)
Chloride: 101 mEq/L (ref 96–112)
Creatinine, Ser: 0.92 mg/dL (ref 0.40–1.50)
GFR: 99.79 mL/min (ref >60.00)
Glucose, Bld: 99 mg/dL (ref 70–99)
Potassium: 4.6 mEq/L (ref 3.5–5.1)
Sodium: 135 mEq/L (ref 135–145)
Total Bilirubin: 0.4 mg/dL (ref 0.2–1.2)
Total Protein: 8.9 g/dL — ABNORMAL HIGH (ref 6.0–8.3)

## 2022-10-10 LAB — LIPID PANEL
Cholesterol: 194 mg/dL (ref 0–200)
HDL: 50 mg/dL (ref >39.00)
LDL Cholesterol: 113 mg/dL — ABNORMAL HIGH (ref 0–99)
NonHDL: 144.48
Total CHOL/HDL Ratio: 4
Triglycerides: 156 mg/dL — ABNORMAL HIGH (ref 0.0–149.0)
VLDL: 31.2 mg/dL (ref 0.0–40.0)

## 2022-10-10 LAB — URIC ACID: Uric Acid, Serum: 10.3 mg/dL — ABNORMAL HIGH (ref 4.0–7.8)

## 2022-10-10 MED ORDER — OLMESARTAN MEDOXOMIL 20 MG PO TABS
ORAL_TABLET | ORAL | 3 refills | Status: DC
Start: 2022-10-10 — End: 2023-10-01

## 2022-10-10 MED ORDER — TRAMADOL HCL 50 MG PO TABS
50.0000 mg | ORAL_TABLET | Freq: Three times a day (TID) | ORAL | 0 refills | Status: AC | PRN
Start: 2022-10-10 — End: 2022-10-15

## 2022-10-10 MED ORDER — DOXYCYCLINE HYCLATE 100 MG PO TABS
100.0000 mg | ORAL_TABLET | Freq: Two times a day (BID) | ORAL | 0 refills | Status: DC
Start: 2022-10-10 — End: 2022-12-12

## 2022-10-10 MED ORDER — HYDROCHLOROTHIAZIDE 25 MG PO TABS
ORAL_TABLET | ORAL | 3 refills | Status: DC
Start: 2022-10-10 — End: 2023-10-01

## 2022-10-10 MED ORDER — ESCITALOPRAM OXALATE 20 MG PO TABS: 20.0000 mg | ORAL_TABLET | Freq: Every day | ORAL | 3 refills | Status: DC

## 2022-10-10 MED ORDER — PREDNISONE 20 MG PO TABS
ORAL_TABLET | ORAL | 0 refills | Status: DC
Start: 2022-10-10 — End: 2022-12-12

## 2022-10-10 MED ORDER — ROSUVASTATIN CALCIUM 10 MG PO TABS
10.0000 mg | ORAL_TABLET | Freq: Every day | ORAL | 3 refills | Status: DC
Start: 2022-10-10 — End: 2023-01-28

## 2022-10-10 MED ORDER — METHYLPREDNISOLONE ACETATE 80 MG/ML IJ SUSP
80.0000 mg | Freq: Once | INTRAMUSCULAR | Status: AC
Start: 2022-10-10 — End: 2022-10-10
  Administered 2022-10-10: 80 mg via INTRAMUSCULAR

## 2022-10-10 NOTE — Progress Notes (Addendum)
Subjective:    Patient ID: [name], male    DOB: Nov 02, 1975, 47 y.o.   MRN: [id number]  HPI  [name] is a very pleasant 47 y.o. male who presents today for complete physical and follow up of chronic conditions.  He would also like to discuss foot pain. About 3 weeks ago he developed bilateral great toe pain which was intermittent. He then noticed pain moving into the feet. He stays hydrated, limits red meat, no recent alcohol use or eating shellfish. About four days ago he developed pain, erythema, and moderate swelling with pain to the right 5th toe. Since then he's noticed moderate swelling to the right foot and now ankle. He continues to experience stiffness to the left great toe. He's been taking Ibuprofen without improvement.   Immunizations: -Tetanus: Completed in 2016 -Influenza: Declines influenza vaccine.   Diet: Fair diet.  Exercise: No regular exercise.  Eye exam: Completed years ago Dental exam: Completed years ago  Colonoscopy: Never completed, did not complete last year   BP Readings from Last 3 Encounters:  10/10/22 138/70  09/07/21 (!) 180/110  04/12/20 (!) 162/98    Wt Readings from Last 3 Encounters:  10/10/22 (!) 341 lb (154.7 kg)  09/07/21 (!) 353 lb (160.1 kg)  04/12/20 (!) 338 lb 8 oz (153.5 kg)       Review of Systems  Constitutional:  Negative for unexpected weight change.  HENT:  Negative for rhinorrhea.   Respiratory:  Negative for cough and shortness of breath.   Cardiovascular:  Negative for chest pain.  Gastrointestinal:  Negative for constipation and diarrhea.  Genitourinary:  Negative for difficulty urinating.  Musculoskeletal:  Positive for arthralgias and joint swelling.  Skin:  Negative for rash.  Allergic/Immunologic: Negative for environmental allergies.  Neurological:  Negative for dizziness and headaches.  Psychiatric/Behavioral:  The patient is not nervous/anxious.          Past Medical  History:  Diagnosis Date   Anorectal abscess    Cellulitis of scrotum 04/21/2019   Chickenpox 06/17/2017   Epididymitis 06/05/2019   Erythrasma 2020   Essential hypertension 04/25/2017   Hydradenitis    Hyperlipidemia    Hypertension    Hypertriglyceridemia 04/25/2017   Scrotal pain 06/05/2019    Social History   Socioeconomic History   Marital status: Married    Spouse name: Not on file   Number of children: Not on file   Years of education: Not on file   Highest education level: Not on file  Occupational History   Not on file  Tobacco Use   Smoking status: Never   Smokeless tobacco: Never  Vaping Use   Vaping status: Never Used  Substance and Sexual Activity   Alcohol use: Yes   Drug use: Never   Sexual activity: Not on file  Other Topics Concern   Not on file  Social History Narrative   Married.   No children.   Works as Medical illustrator.    Enjoys playing guitar, lifting weights, walking.   Social Determinants of Health   Financial Resource Strain: Not on file  Food Insecurity: Not on file  Transportation Needs: Not on file  Physical Activity: Not on file  Stress: Not on file  Social Connections: Not on file  Intimate Partner Violence: Not on file    History reviewed. No pertinent surgical history.  Family History  Problem Relation Age of Onset   Arthritis Mother    Diabetes Mother  Heart disease Mother    Hyperlipidemia Mother    Hypertension Mother    Cancer Father        lung   COPD Father    Arthritis Sister    Arthritis Maternal Grandmother    Diabetes Maternal Grandmother    Heart disease Maternal Grandmother    Hyperlipidemia Maternal Grandmother    Hypertension Maternal Grandmother    Arthritis Maternal Grandfather    Heart attack Maternal Grandfather    Heart disease Maternal Grandfather    Hyperlipidemia Maternal Grandfather    Hypertension Maternal Grandfather    Arthritis Paternal Grandmother    Melanoma Paternal  Grandmother    Hyperlipidemia Paternal Grandmother    Hypertension Paternal Grandmother    Arthritis Paternal Grandfather    Diabetes Paternal Grandfather    Heart attack Paternal Grandfather    Hyperlipidemia Paternal Grandfather    Hypertension Paternal Grandfather     Allergies  Allergen Reactions   Bactrim [Sulfamethoxazole-Trimethoprim] Rash    Current Outpatient Medications on File Prior to Visit  Medication Sig Dispense Refill   B Complex-Biotin-FA (B-COMPLEX PO) Take 35 mg by mouth daily.     No current facility-administered medications on file prior to visit.    BP 138/70   Pulse 90   Temp (!) 97.2 F (36.2 C) (Temporal)   Ht 6\' 1"  (1.854 m)   Wt (!) 341 lb (154.7 kg)   SpO2 97%   BMI 44.99 kg/m  Objective:   Physical Exam HENT:     Right Ear: Tympanic membrane and ear canal normal.     Left Ear: Tympanic membrane and ear canal normal.     Nose: Nose normal.     Right Sinus: No maxillary sinus tenderness or frontal sinus tenderness.     Left Sinus: No maxillary sinus tenderness or frontal sinus tenderness.  Eyes:     Conjunctiva/sclera: Conjunctivae normal.  Neck:     Thyroid: No thyromegaly.     Vascular: No carotid bruit.  Cardiovascular:     Rate and Rhythm: Normal rate and regular rhythm.     Heart sounds: Normal heart sounds.  Pulmonary:     Effort: Pulmonary effort is normal.     Breath sounds: Normal breath sounds. No wheezing or rales.  Abdominal:     General: Bowel sounds are normal.     Palpations: Abdomen is soft.     Tenderness: There is no abdominal tenderness.  Musculoskeletal:        General: Normal range of motion.     Cervical back: Neck supple.       Feet:  Feet:     Right foot:     Skin integrity: Erythema and warmth present.     Comments: Moderate erythema to right 5th toe with moderate swelling. Also with generalized right foot swelling. Closed callous to right lateral 5th toe.  Skin:    General: Skin is warm and dry.      Findings: Erythema present.  Neurological:     Mental Status: He is alert and oriented to person, place, and time.     Cranial Nerves: No cranial nerve deficit.     Deep Tendon Reflexes: Reflexes are normal and symmetric.  Psychiatric:        Mood and Affect: Mood normal.           Assessment & Plan:  Encounter for annual general medical examination with abnormal findings in adult Assessment & Plan: Immunizations UTD. Declines influenza vaccine.  Colonoscopy  due, referral placed to GI  Discussed the importance of a healthy diet and regular exercise in order for weight loss, and to reduce the risk of further co-morbidity.  Exam as noted Labs pending.  Follow up in 1 year for repeat physical.    Essential hypertension Assessment & Plan: Overall controlled.  Continue hydrochlorothiazide 25 mg daily, olmesartan 20 mg daily. CMP pending.  Orders: -     Comprehensive metabolic panel -     hydroCHLOROthiazide; TAKE 1 TABLET BY MOUTH DAILY. FOR BLOOD PRESSURE.  Dispense: 90 tablet; Refill: 3 -     Olmesartan Medoxomil; TAKE 1 TABLET BY MOUTH DAILY. FOR BLOOD PRESSURE.  Dispense: 90 tablet; Refill: 3  Hypertriglyceridemia Assessment & Plan: Repeat lipid panel pending.  Discussed the importance of a healthy diet and regular exercise in order for weight loss, and to reduce the risk of further co-morbidity.  Continue rosuvastatin 10 mg daily  Orders: -     Lipid panel -     Rosuvastatin Calcium; Take 1 tablet (10 mg total) by mouth daily. for cholesterol.  Dispense: 90 tablet; Refill: 3  GAD (generalized anxiety disorder) Assessment & Plan: Controlled.  Continue Lexapro 20 mg daily.  Orders: -     Escitalopram Oxalate; Take 1 tablet (20 mg total) by mouth daily. For anxiety.  Dispense: 90 tablet; Refill: 3  Pain and swelling of toe, right Assessment & Plan: Appears to be acute gout flare with cellulitis.  IM Depo Medrol 80 mg provided today.  Checking labs  today including CBC with differential and uric acid.  Start Doxycycline antibiotic for the infection. Take 1 tablet by mouth twice daily for 7 days. Start prednisone 20 mg. Take 3 tablets my mouth once daily in the morning for 3 days, then 2 tablets for 3 days, then 1 tablet for 3 days. Prescription for tramadol 50 mg provided to use every 8 hours as needed.  He will update via MyChart with pictures early next week. Return precautions provided.  Work note provided.  Orders: -     Uric acid -     CBC with Differential/Platelet -     predniSONE; Take 3 tablets my mouth once daily in the morning for 3 days, then 2 tablets for 3 days, then 1 tablet for 3 days.  Dispense: 18 tablet; Refill: 0 -     Doxycycline Hyclate; Take 1 tablet (100 mg total) by mouth 2 (two) times daily.  Dispense: 14 tablet; Refill: 0 -     methylPREDNISolone Acetate -     traMADol HCl; Take 1 tablet (50 mg total) by mouth every 8 (eight) hours as needed for up to 5 days.  Dispense: 15 tablet; Refill: 0  Screening for colon cancer -     Ambulatory referral to Gastroenterology        Doreene Nest, NP

## 2022-10-10 NOTE — Assessment & Plan Note (Signed)
Controlled.  Continue Lexapro 20 mg daily.

## 2022-10-10 NOTE — Assessment & Plan Note (Addendum)
Appears to be acute gout flare with cellulitis.  IM Depo Medrol 80 mg provided today.  Checking labs today including CBC with differential and uric acid.  Start Doxycycline antibiotic for the infection. Take 1 tablet by mouth twice daily for 7 days. Start prednisone 20 mg. Take 3 tablets my mouth once daily in the morning for 3 days, then 2 tablets for 3 days, then 1 tablet for 3 days. Prescription for tramadol 50 mg provided to use every 8 hours as needed.  He will update via MyChart with pictures early next week. Return precautions provided.  Work note provided.

## 2022-10-10 NOTE — Patient Instructions (Signed)
Start prednisone for pain and inflammation.  Take 3 tablets my mouth once daily in the morning for 3 days, then 2 tablets for 3 days, then 1 tablet for 3 days.  Start Doxycycline antibiotic for the infection. Take 1 tablet by mouth twice daily for 7 days.  Continue warm soaks.  Stop by the lab prior to leaving today. I will notify you of your results once received.   Send me updates via MyChart early next week.  It was a pleasure to see you today!

## 2022-10-10 NOTE — Assessment & Plan Note (Signed)
Overall controlled.  Continue hydrochlorothiazide 25 mg daily, olmesartan 20 mg daily. CMP pending.

## 2022-10-10 NOTE — Assessment & Plan Note (Addendum)
Repeat lipid panel pending.  Discussed the importance of a healthy diet and regular exercise in order for weight loss, and to reduce the risk of further co-morbidity. Continue rosuvastatin 10 mg daily.

## 2022-10-10 NOTE — Addendum Note (Signed)
Addended by: Doreene Nest on: 10/10/2022 11:43 AM   Modules accepted: Orders

## 2022-10-10 NOTE — Assessment & Plan Note (Signed)
Immunizations UTD. Declines influenza vaccine.  Colonoscopy due, referral placed to GI  Discussed the importance of a healthy diet and regular exercise in order for weight loss, and to reduce the risk of further co-morbidity.  Exam as noted Labs pending.  Follow up in 1 year for repeat physical.

## 2022-10-23 ENCOUNTER — Encounter: Payer: Self-pay | Admitting: *Deleted

## 2022-12-11 ENCOUNTER — Ambulatory Visit: Payer: PRIVATE HEALTH INSURANCE | Admitting: Primary Care

## 2022-12-12 ENCOUNTER — Ambulatory Visit: Payer: PRIVATE HEALTH INSURANCE | Admitting: Primary Care

## 2022-12-12 VITALS — BP 136/78 | HR 89 | Temp 97.3°F | Ht 73.0 in | Wt 337.0 lb

## 2022-12-12 DIAGNOSIS — M5441 Lumbago with sciatica, right side: Secondary | ICD-10-CM | POA: Diagnosis not present

## 2022-12-12 DIAGNOSIS — G8929 Other chronic pain: Secondary | ICD-10-CM | POA: Diagnosis not present

## 2022-12-12 DIAGNOSIS — M1A9XX0 Chronic gout, unspecified, without tophus (tophi): Secondary | ICD-10-CM | POA: Diagnosis not present

## 2022-12-12 DIAGNOSIS — M7989 Other specified soft tissue disorders: Secondary | ICD-10-CM

## 2022-12-12 DIAGNOSIS — M79674 Pain in right toe(s): Secondary | ICD-10-CM

## 2022-12-12 NOTE — Assessment & Plan Note (Signed)
Improving.  Reviewed urgent care notes from November 2024 through Care Everywhere.  Continue to monitor.

## 2022-12-12 NOTE — Progress Notes (Signed)
Subjective:    Patient ID: [name], male    DOB: 1975/01/27, 47 y.o.   MRN: [id number]  Back Pain Associated symptoms include numbness.  Toe Pain  Associated symptoms include numbness.    [name] is a very pleasant 47 y.o. male with a history of acute gout, hypertension, hypertriglyceridemia, acute right 5th toe swelling and pain who presents today to discuss low back pain and for follow-up of toe pain  He was last evaluated on 10/10/2022 for his annual physical and also for a 3-week history of bilateral great toe pain.  4 days prior to this visit he developed moderate swelling and erythema to the right fifth toe and ankle.  During this visit he was initiated on doxycycline 100 mg twice daily x 7 days, prednisone taper, and tramadol as needed.  Evaluated by urgent care on 11/23/2022 for a 1 day history of right lower back pain after rising from a squatting position while working on an [facility] unit.  Also with radiculopathy to right lower extremity including buttocks and thigh.  During this visit he was treated with prednisone taper, Skelaxin muscle relaxer, hydrocodone.  He underwent plain films of the lumbar spine.  We do not have those records. He was also treated with amoxicillin/clavulanate 875-125 mg BID x 10 days for ongoing right fifth toe cellulitis.   Today he reports improved symptoms to the right 5th toe, less redness and swelling. He completed the course of Augmentin.  His toe continues to improve.  His lower back is better, but he continues to experience a dull pain with intermittent numbness to the right buttock.  He was told that the x-rays of his lumbar spine show degenerative disc disease with bone spurring and disc height loss.  He was referred to orthopedics but has not heard anything regarding this referral.  He's been taking an herbal uric acid reducer OTC with improvement.  His bilateral great toe pain has resolved.  BP Readings from Last 3  Encounters:  12/12/22 136/78  10/10/22 138/70  09/07/21 (!) 180/110   Wt Readings from Last 3 Encounters:  12/12/22 (!) 337 lb (152.9 kg)  10/10/22 (!) 341 lb (154.7 kg)  09/07/21 (!) 353 lb (160.1 kg)     Review of Systems  Musculoskeletal:  Positive for arthralgias and back pain. Negative for joint swelling.  Skin:  Negative for color change.  Neurological:  Positive for numbness.         Past Medical History:  Diagnosis Date   Anorectal abscess    Cellulitis of scrotum 04/21/2019   Chickenpox 06/17/2017   Epididymitis 06/05/2019   Erythrasma 2020   Essential hypertension 04/25/2017   Hydradenitis    Hyperlipidemia    Hypertension    Hypertriglyceridemia 04/25/2017   Scrotal pain 06/05/2019    Social History   Socioeconomic History   Marital status: Married    Spouse name: Not on file   Number of children: Not on file   Years of education: Not on file   Highest education level: Associate degree: occupational, Scientist, product/process development, or vocational program  Occupational History   Not on file  Tobacco Use   Smoking status: Never   Smokeless tobacco: Never  Vaping Use   Vaping status: Never Used  Substance and Sexual Activity   Alcohol use: Yes   Drug use: Never   Sexual activity: Not on file  Other Topics Concern   Not on file  Social History Narrative   Married.  No children.   Works as Medical illustrator.    Enjoys playing guitar, lifting weights, walking.   Social Determinants of Health   Financial Resource Strain: Low Risk  (12/12/2022)   Overall Financial Resource Strain (CARDIA)    Difficulty of Paying Living Expenses: Not very hard  Food Insecurity: No Food Insecurity (12/12/2022)   Hunger Vital Sign    Worried About Running Out of Food in the Last Year: Never true    Ran Out of Food in the Last Year: Never true  Transportation Needs: No Transportation Needs (12/12/2022)   PRAPARE - Administrator, Civil Service (Medical): No    Lack  of Transportation (Non-Medical): No  Physical Activity: Insufficiently Active (12/12/2022)   Exercise Vital Sign    Days of Exercise per Week: 6 days    Minutes of Exercise per Session: 20 min  Stress: No Stress Concern Present (12/12/2022)   [employer] of Occupational Health - Occupational Stress Questionnaire    Feeling of Stress : Not at all  Social Connections: Unknown (12/12/2022)   Social Connection and Isolation Panel [NHANES]    Frequency of Communication with Friends and Family: Three times a week    Frequency of Social Gatherings with Friends and Family: Once a week    Attends Religious Services: Patient declined    Database administrator or Organizations: No    Attends Engineer, structural: Not on file    Marital Status: Married  Catering manager Violence: Not on file    History reviewed. No pertinent surgical history.  Family History  Problem Relation Age of Onset   Arthritis Mother    Diabetes Mother    Heart disease Mother    Hyperlipidemia Mother    Hypertension Mother    Cancer Father        lung   COPD Father    Arthritis Sister    Arthritis Maternal Grandmother    Diabetes Maternal Grandmother    Heart disease Maternal Grandmother    Hyperlipidemia Maternal Grandmother    Hypertension Maternal Grandmother    Arthritis Maternal Grandfather    Heart attack Maternal Grandfather    Heart disease Maternal Grandfather    Hyperlipidemia Maternal Grandfather    Hypertension Maternal Grandfather    Arthritis Paternal Grandmother    Melanoma Paternal Grandmother    Hyperlipidemia Paternal Grandmother    Hypertension Paternal Grandmother    Arthritis Paternal Grandfather    Diabetes Paternal Grandfather    Heart attack Paternal Grandfather    Hyperlipidemia Paternal Grandfather    Hypertension Paternal Grandfather     Allergies  Allergen Reactions   Bactrim [Sulfamethoxazole-Trimethoprim] Rash    Current Outpatient Medications on File  Prior to Visit  Medication Sig Dispense Refill   B Complex-Biotin-FA (B-COMPLEX PO) Take 35 mg by mouth daily.     escitalopram (LEXAPRO) 20 MG tablet Take 1 tablet (20 mg total) by mouth daily. For anxiety. 90 tablet 3   hydrochlorothiazide (HYDRODIURIL) 25 MG tablet TAKE 1 TABLET BY MOUTH DAILY. FOR BLOOD PRESSURE. 90 tablet 3   olmesartan (BENICAR) 20 MG tablet TAKE 1 TABLET BY MOUTH DAILY. FOR BLOOD PRESSURE. 90 tablet 3   rosuvastatin (CRESTOR) 10 MG tablet Take 1 tablet (10 mg total) by mouth daily. for cholesterol. 90 tablet 3   No current facility-administered medications on file prior to visit.    BP 136/78   Pulse 89   Temp (!) 97.3 F (36.3 C) (Temporal)   Ht  6\' 1"  (1.854 m)   Wt (!) 337 lb (152.9 kg)   SpO2 96%   BMI 44.46 kg/m  Objective:   Physical Exam Cardiovascular:     Rate and Rhythm: Normal rate and regular rhythm.  Pulmonary:     Effort: Pulmonary effort is normal.     Breath sounds: Normal breath sounds.  Musculoskeletal:     Cervical back: Neck supple.  Skin:    General: Skin is warm and dry.     Comments: Scabbing noted to the right lateral fifth toe with mild light pink discoloration.  Neurological:     Mental Status: He is alert and oriented to person, place, and time.  Psychiatric:        Mood and Affect: Mood normal.           Assessment & Plan:  Chronic gout involving toe without tophus, unspecified cause, unspecified laterality Assessment & Plan: Last flare resolved.  Checking uric acid levels today. Consider allopurinol if needed.  He will update.  Orders: -     Uric acid  Chronic right-sided low back pain with right-sided sciatica Assessment & Plan: Acute on chronic flare, reviewed urgent care notes from care everywhere from November 2024.  Referral placed to neurosurgery for further evaluation, potential injections, physical therapy.   Orders: -     Ambulatory referral to Neurosurgery  Pain and swelling of toe,  right Assessment & Plan: Improving.  Reviewed urgent care notes from November 2024 through Care Everywhere.  Continue to monitor.          Doreene Nest, NP

## 2022-12-12 NOTE — Assessment & Plan Note (Signed)
Acute on chronic flare, reviewed urgent care notes from care everywhere from November 2024.  Referral placed to neurosurgery for further evaluation, potential injections, physical therapy.

## 2022-12-12 NOTE — Patient Instructions (Signed)
Stop by the lab prior to leaving today. I will notify you of your results once received.   You will either be contacted via phone regarding your referral to neurosurgery, or you may receive a letter on your MyChart portal from our referral team with instructions for scheduling an appointment. Please let us know if you have not been contacted by anyone within two weeks.  It was a pleasure to see you today!

## 2022-12-12 NOTE — Assessment & Plan Note (Signed)
Last flare resolved.  Checking uric acid levels today. Consider allopurinol if needed.  He will update.

## 2022-12-13 LAB — URIC ACID: Uric Acid, Serum: 10.7 mg/dL — ABNORMAL HIGH (ref 4.0–8.0)

## 2022-12-14 DIAGNOSIS — M1A9XX0 Chronic gout, unspecified, without tophus (tophi): Secondary | ICD-10-CM

## 2022-12-14 DIAGNOSIS — G8929 Other chronic pain: Secondary | ICD-10-CM

## 2022-12-18 MED ORDER — ALLOPURINOL 100 MG PO TABS
100.0000 mg | ORAL_TABLET | Freq: Every day | ORAL | 0 refills | Status: DC
Start: 2022-12-18 — End: 2023-03-17

## 2022-12-20 MED ORDER — METAXALONE 800 MG PO TABS: 800.0000 mg | ORAL_TABLET | Freq: Three times a day (TID) | ORAL | 0 refills | Status: DC | PRN

## 2023-01-01 NOTE — Progress Notes (Deleted)
Referring Physician:  Doreene Nest, NP [address] [place],  [place] [address]  Primary Physician:  Doreene Nest, NP  History of Present Illness: 01/01/2023*** Mr. Kaydon Wittler has a history of anxiety, HTN, obesity, hypertriglyceridemia, and gout.    Seen by UC at [facility] for LBP and right small toe infection on 11/23/22. Was referred to [facility] for his toe.   He started with right sided LBP and right *** leg pain on 11/22/22.    Given prednisone, skelaxin, and hydrocodone from UC.   Duration: *** Location: *** Quality: *** Severity: ***  Precipitating: aggravated by *** Modifying factors: made better by *** Weakness: none Timing: *** Bowel/Bladder Dysfunction: none  Conservative measures:  Physical therapy: ***  Multimodal medical therapy including regular antiinflammatories: ***  Injections: *** epidural steroid injections  Past Surgery: ***  Sarita Bottom has ***no symptoms of cervical myelopathy.  The symptoms are causing a significant impact on the patient's life.   Review of Systems:  A 10 point review of systems is negative, except for the pertinent positives and negatives detailed in the HPI.  Past Medical History: Past Medical History:  Diagnosis Date   Anorectal abscess    Cellulitis of scrotum 04/21/2019   Chickenpox 06/17/2017   Epididymitis 06/05/2019   Erythrasma 2020   Essential hypertension 04/25/2017   Hydradenitis    Hyperlipidemia    Hypertension    Hypertriglyceridemia 04/25/2017   Scrotal pain 06/05/2019    Past Surgical History: No past surgical history on file.  Allergies: Allergies as of 01/03/2023 - Review Complete 12/12/2022  Allergen Reaction Noted   Bactrim [sulfamethoxazole-trimethoprim] Rash 06/12/2019    Medications: Outpatient Encounter Medications as of 01/03/2023  Medication Sig   allopurinol (ZYLOPRIM) 100 MG tablet Take 1 tablet (100 mg total) by mouth daily. For gout  prevention   B Complex-Biotin-FA (B-COMPLEX PO) Take 35 mg by mouth daily.   escitalopram (LEXAPRO) 20 MG tablet Take 1 tablet (20 mg total) by mouth daily. For anxiety.   hydrochlorothiazide (HYDRODIURIL) 25 MG tablet TAKE 1 TABLET BY MOUTH DAILY. FOR BLOOD PRESSURE.   metaxalone (SKELAXIN) 800 MG tablet Take 1 tablet (800 mg total) by mouth 3 (three) times daily as needed for muscle spasms.   olmesartan (BENICAR) 20 MG tablet TAKE 1 TABLET BY MOUTH DAILY. FOR BLOOD PRESSURE.   rosuvastatin (CRESTOR) 10 MG tablet Take 1 tablet (10 mg total) by mouth daily. for cholesterol.   No facility-administered encounter medications on file as of 01/03/2023.    Social History: Social History   Tobacco Use   Smoking status: Never   Smokeless tobacco: Never  Vaping Use   Vaping status: Never Used  Substance Use Topics   Alcohol use: Yes   Drug use: Never    Family Medical History: Family History  Problem Relation Age of Onset   Arthritis Mother    Diabetes Mother    Heart disease Mother    Hyperlipidemia Mother    Hypertension Mother    Cancer Father        lung   COPD Father    Arthritis Sister    Arthritis Maternal Grandmother    Diabetes Maternal Grandmother    Heart disease Maternal Grandmother    Hyperlipidemia Maternal Grandmother    Hypertension Maternal Grandmother    Arthritis Maternal Grandfather    Heart attack Maternal Grandfather    Heart disease Maternal Grandfather    Hyperlipidemia Maternal Grandfather    Hypertension Maternal  Grandfather    Arthritis Paternal Grandmother    Melanoma Paternal Grandmother    Hyperlipidemia Paternal Grandmother    Hypertension Paternal Grandmother    Arthritis Paternal Grandfather    Diabetes Paternal Grandfather    Heart attack Paternal Grandfather    Hyperlipidemia Paternal Grandfather    Hypertension Paternal Grandfather     Physical Examination: There were no vitals filed for this visit.  General: Patient is well  developed, well nourished, calm, collected, and in no apparent distress. Attention to examination is appropriate.  Respiratory: Patient is breathing without any difficulty.   NEUROLOGICAL:     Awake, alert, oriented to person, place, and time.  Speech is clear and fluent. Fund of knowledge is appropriate.   Cranial Nerves: Pupils equal round and reactive to light.  Facial tone is symmetric.    *** ROM of cervical spine *** pain *** posterior cervical tenderness. *** tenderness in bilateral trapezial region.   *** ROM of lumbar spine *** pain *** posterior lumbar tenderness.   No abnormal lesions on exposed skin.   Strength: Side Biceps Triceps Deltoid Interossei Grip Wrist Ext. Wrist Flex.  R 5 5 5 5 5 5 5   L 5 5 5 5 5 5 5    Side Iliopsoas Quads Hamstring PF DF EHL  R 5 5 5 5 5 5   L 5 5 5 5 5 5    Reflexes are ***2+ and symmetric at the biceps, brachioradialis, patella and achilles.   Hoffman's is absent.  Clonus is not present.   Bilateral upper and lower extremity sensation is intact to light touch.     Gait is normal.   ***No difficulty with tandem gait.    Medical Decision Making  Imaging: Lumbar xrays dated 11/23/22:  ***  I have personally reviewed the images and agree with the above interpretation.  Assessment and Plan: Mr. Bourgault is a pleasant 47 y.o. male has ***  Treatment options discussed with patient and following plan made:   - Order for physical therapy for *** spine ***. Patient to call to schedule appointment. *** - Continue current medications including ***. Reviewed dosing and side effects.  - Prescription for ***. Reviewed dosing and side effects. Take with food.  - Prescription for *** to take prn muscle spasms. Reviewed dosing and side effects. Discussed this can cause drowsiness.  - MRI of *** to further evaluate *** radiculopathy. No improvement time or medications (***).  - Referral to PMR at [facility] to discuss possible *** injections.  -  Will schedule phone visit to review MRI results once I get them back.   I spent a total of *** minutes in face-to-face and non-face-to-face activities related to this patient's care today including review of outside records, review of imaging, review of symptoms, physical exam, discussion of differential diagnosis, discussion of treatment options, and documentation.   Thank you for involving me in the care of this patient.   Drake Leach PA-C Dept. of Neurosurgery

## 2023-01-02 ENCOUNTER — Inpatient Hospital Stay
Admission: RE | Admit: 2023-01-02 | Discharge: 2023-01-02 | Disposition: A | Discharge status: Home / Self Care | Payer: Self-pay | Source: Ambulatory Visit | Attending: Orthopedic Surgery | Admitting: Orthopedic Surgery

## 2023-01-02 ENCOUNTER — Other Ambulatory Visit: Payer: Self-pay | Admitting: Family Medicine

## 2023-01-02 DIAGNOSIS — Z049 Encounter for examination and observation for unspecified reason: Secondary | ICD-10-CM

## 2023-01-03 ENCOUNTER — Ambulatory Visit: Payer: PRIVATE HEALTH INSURANCE | Admitting: Orthopedic Surgery

## 2023-01-24 NOTE — Progress Notes (Signed)
 Referring Physician:  Gretta Comer POUR, NP [address] [place],  [place] [address]  Primary Physician:  Gretta Comer POUR, NP  History of Present Illness: 01/28/2023 [name] has a history of anxiety, HTN, obesity, hypertriglyceridemia, and gout.    Seen by UC at [facility] for LBP and right small toe infection on 11/23/22. Was referred to [facility] for his toe.   He has chronic constant right sided LBP and intermittent right posterior/anterior leg pain. Right leg pain started in November. Pain is worse with lifting. Pain is better with laying flat/still. No tingling or weakness. He has numbness.   Given prednisone , skelaxin , and hydrocodone from UC. Not sure how much it helped. He continues on skelaxin  and advil.   He does not smoke.   Bowel/Bladder Dysfunction: none  Conservative measures:  Physical therapy: has not participated in PT  Multimodal medical therapy including regular antiinflammatories: Prednisone , Hydrocodone, Metaxalone   Injections: no epidural steroid injections  Past Surgery: none  [name] has no symptoms of cervical myelopathy.  The symptoms are causing a significant impact on the patient's life.   Review of Systems:  A 10 point review of systems is negative, except for the pertinent positives and negatives detailed in the HPI.  Past Medical History: Past Medical History:  Diagnosis Date   Anorectal abscess    Cellulitis of scrotum 04/21/2019   Chickenpox 06/17/2017   Epididymitis 06/05/2019   Erythrasma 2020   Essential hypertension 04/25/2017   Hydradenitis    Hyperlipidemia    Hypertension    Hypertriglyceridemia 04/25/2017   Scrotal pain 06/05/2019    Past Surgical History: History reviewed. No pertinent surgical history.  Allergies: Allergies as of 01/28/2023 - Review Complete 01/28/2023  Allergen Reaction Noted   Bactrim  [sulfamethoxazole -trimethoprim ] Rash 06/12/2019    Medications: Outpatient  Encounter Medications as of 01/28/2023  Medication Sig   allopurinol  (ZYLOPRIM ) 100 MG tablet Take 1 tablet (100 mg total) by mouth daily. For gout prevention   atorvastatin  (LIPITOR) 20 MG tablet TAKE 1 TABLET BY MOUTH DAILY. FOR CHOLESTEROL   B Complex-Biotin-FA (B-COMPLEX PO) Take 35 mg by mouth daily.   escitalopram  (LEXAPRO ) 20 MG tablet Take 1 tablet (20 mg total) by mouth daily. For anxiety.   hydrochlorothiazide  (HYDRODIURIL ) 25 MG tablet TAKE 1 TABLET BY MOUTH DAILY. FOR BLOOD PRESSURE.   ibuprofen (ADVIL) 200 MG tablet Take 200 mg by mouth every 6 (six) hours as needed.   metaxalone  (SKELAXIN ) 800 MG tablet Take 1 tablet (800 mg total) by mouth 3 (three) times daily as needed for muscle spasms.   olmesartan  (BENICAR ) 20 MG tablet TAKE 1 TABLET BY MOUTH DAILY. FOR BLOOD PRESSURE.   [DISCONTINUED] rosuvastatin  (CRESTOR ) 10 MG tablet Take 1 tablet (10 mg total) by mouth daily. for cholesterol.   No facility-administered encounter medications on file as of 01/28/2023.    Social History: Social History   Tobacco Use   Smoking status: Never   Smokeless tobacco: Never  Vaping Use   Vaping status: Never Used  Substance Use Topics   Alcohol use: Yes   Drug use: Never    Family Medical History: Family History  Problem Relation Age of Onset   Arthritis Mother    Diabetes Mother    Heart disease Mother    Hyperlipidemia Mother    Hypertension Mother    Cancer Father        lung   COPD Father    Arthritis Sister    Arthritis  Maternal Grandmother    Diabetes Maternal Grandmother    Heart disease Maternal Grandmother    Hyperlipidemia Maternal Grandmother    Hypertension Maternal Grandmother    Arthritis Maternal Grandfather    Heart attack Maternal Grandfather    Heart disease Maternal Grandfather    Hyperlipidemia Maternal Grandfather    Hypertension Maternal Grandfather    Arthritis Paternal Grandmother    Melanoma Paternal Grandmother    Hyperlipidemia Paternal  Grandmother    Hypertension Paternal Grandmother    Arthritis Paternal Grandfather    Diabetes Paternal Grandfather    Heart attack Paternal Grandfather    Hyperlipidemia Paternal Grandfather    Hypertension Paternal Grandfather     Physical Examination: Vitals:   01/28/23 0953 01/28/23 1038  BP: (!) 184/102 (!) 172/112    General: Patient is well developed, well nourished, calm, collected, and in no apparent distress. Attention to examination is appropriate.  Respiratory: Patient is breathing without any difficulty.   NEUROLOGICAL:     Awake, alert, oriented to person, place, and time.  Speech is clear and fluent. Fund of knowledge is appropriate.   Cranial Nerves: Pupils equal round and reactive to light.  Facial tone is symmetric.    Mild lower central and right sided posterior lumbar tenderness.   No abnormal lesions on exposed skin.   Strength: Side Biceps Triceps Deltoid Interossei Grip Wrist Ext. Wrist Flex.  R 5 5 5 5 5 5 5   L 5 5 5 5 5 5 5    Side Iliopsoas Quads Hamstring PF DF EHL  R 5 5 5 5 5 5   L 5 5 5 5 5 5    Reflexes are 2+ and symmetric at the biceps, brachioradialis, patella and achilles.   Hoffman's is absent.  Clonus is not present.   Bilateral upper and lower extremity sensation is intact to light touch.     Gait is normal.     Medical Decision Making  Imaging: Lumbar xrays dated 11/23/22:  Diffuse lumbar spondylosis and DDD with anterior spurring.   Xray report not available for above images.   Assessment and Plan: [name] has chronic constant right sided LBP and intermittent right posterior/anterior leg pain. Right leg pain started in November. No tingling or weakness. He has numbness.  He has known diffuse lumbar spondylosis and DDD with anterior spurring.   Treatment options discussed with patient and following plan made:   - MRI of lumbar spine to further evaluate right lumbar radiculopathy. No improvement with time or  medications (prednisone , skelaxin , motrin). Will do WIDE BORE MRI at [facility].  - As above, no relief with prednisone . Continue on prn skelaxin  and motrin. Reviewed dosing and side effects.  - Will schedule follow up visit to review MRI results once I get them back.   BP was elevated. No symptoms of chest pain, shortness of breath, blurry vision, or headaches. He checks BP at home and it generally runs 130s/90s. He was very nervous/anxious for this visit today.   He will recheck at home and call PCP if not improved. If he develops CP, SOB, blurry vision, or headaches, then he will go to ED.     I spent a total of 35 minutes in face-to-face and non-face-to-face activities related to this patient's care today including review of outside records, review of imaging, review of symptoms, physical exam, discussion of differential diagnosis, discussion of treatment options, and documentation.   Thank you for involving me in the care of this patient.  Glade Boys PA-C Dept. of Neurosurgery

## 2023-01-28 ENCOUNTER — Ambulatory Visit (INDEPENDENT_AMBULATORY_CARE_PROVIDER_SITE_OTHER): Payer: PRIVATE HEALTH INSURANCE | Admitting: Orthopedic Surgery

## 2023-01-28 ENCOUNTER — Encounter: Payer: Self-pay | Admitting: Orthopedic Surgery

## 2023-01-28 VITALS — BP 172/112 | Ht 73.0 in | Wt 345.0 lb

## 2023-01-28 DIAGNOSIS — M47816 Spondylosis without myelopathy or radiculopathy, lumbar region: Secondary | ICD-10-CM | POA: Diagnosis not present

## 2023-01-28 DIAGNOSIS — M5416 Radiculopathy, lumbar region: Secondary | ICD-10-CM

## 2023-01-28 DIAGNOSIS — M51362 Other intervertebral disc degeneration, lumbar region with discogenic back pain and lower extremity pain: Secondary | ICD-10-CM | POA: Diagnosis not present

## 2023-01-28 NOTE — Patient Instructions (Signed)
 It was so nice to see you today. Thank you so much for coming in.    You have some wear and tear (arthritis) in your lower back and this is likely causing some of your pain.   I want to get an MRI of your lower back to look into things further. We will get this approved through your insurance and [facility] will call you to schedule the appointment. Make sure you are scheduled for the WIDE BORE MRI.   After you have the MRI, it takes 14-21 days for me to get the results back. Once I have them, we will call you to schedule a follow up visit with me to review them.   Your blood pressure was elevated today. I want you to recheck it at home in a few hours and follow up with your PCP if it remains high. If you have any chest pain, shortness of breath, blurry vision, or headaches then you need to go to ED.    Please do not hesitate to call if you have any questions or concerns. You can also message me in MyChart.   Glade Boys PA-C [phone]     The physicians and staff at [facility] at [facility] are committed to providing excellent care. You may receive a survey asking for feedback about your experience at our office. We value you your feedback and appreciate you taking the time to to fill it out. The [facility] leadership team is also available to discuss your experience in person, feel free to contact us  [phone].

## 2023-01-31 ENCOUNTER — Encounter: Payer: Self-pay | Admitting: *Deleted

## 2023-02-03 ENCOUNTER — Other Ambulatory Visit: Payer: PRIVATE HEALTH INSURANCE

## 2023-02-11 NOTE — Addendum Note (Signed)
Addended by: Ernie Hew on: 02/11/2023 09:49 AM   Modules accepted: Orders

## 2023-02-26 NOTE — Telephone Encounter (Signed)
MRI report has been scanned from [facility]. Please request images.

## 2023-02-28 ENCOUNTER — Other Ambulatory Visit: Payer: Self-pay

## 2023-02-28 ENCOUNTER — Inpatient Hospital Stay
Admission: RE | Admit: 2023-02-28 | Discharge: 2023-02-28 | Disposition: A | Discharge status: Home / Self Care | Payer: Self-pay | Source: Ambulatory Visit | Attending: Orthopedic Surgery | Admitting: Orthopedic Surgery

## 2023-02-28 DIAGNOSIS — Z049 Encounter for examination and observation for unspecified reason: Secondary | ICD-10-CM

## 2023-02-28 NOTE — Telephone Encounter (Signed)
Please schedule him a follow up with me in clinic to review his MRI results.

## 2023-02-28 NOTE — Telephone Encounter (Signed)
Images have been loaded to pt's chart

## 2023-03-01 NOTE — Progress Notes (Unsigned)
Referring Physician:  Doreene Nest, NP [address] [place],  [place] [address]  Primary Physician:  Doreene Nest, NP  History of Present Illness: 03/01/2023 [name] has a history of anxiety, HTN, obesity, hypertriglyceridemia, and gout.    Last seen by me on 01/28/23 for back and right leg pain. He has known diffuse lumbar spondylosis and DDD with anterior spurring.   He is here to review his lumbar MRI results.        Seen by UC at [facility] for LBP and right small toe infection on 11/23/22. Was referred to [facility] for his toe.   He has chronic constant right sided LBP and intermittent right posterior/anterior leg pain. Right leg pain started in November. Pain is worse with lifting. Pain is better with laying flat/still. No tingling or weakness. He has numbness.   Given prednisone, skelaxin, and hydrocodone from UC. Not sure how much it helped. He continues on skelaxin and advil.   He does not smoke.   Bowel/Bladder Dysfunction: none  Conservative measures:  Physical therapy: has not participated in PT  Multimodal medical therapy including regular antiinflammatories: Prednisone, Hydrocodone, Metaxalone  Injections: no epidural steroid injections  Past Surgery: none  [name] has no symptoms of cervical myelopathy.  The symptoms are causing a significant impact on the patient's life.   Review of Systems:  A 10 point review of systems is negative, except for the pertinent positives and negatives detailed in the HPI.  Past Medical History: Past Medical History:  Diagnosis Date   Anorectal abscess    Cellulitis of scrotum 04/21/2019   Chickenpox 06/17/2017   Epididymitis 06/05/2019   Erythrasma 2020   Essential hypertension 04/25/2017   Hydradenitis    Hyperlipidemia    Hypertension    Hypertriglyceridemia 04/25/2017   Scrotal pain 06/05/2019    Past Surgical History: No past surgical history on  file.  Allergies: Allergies as of 03/06/2023 - Review Complete 01/28/2023  Allergen Reaction Noted   Bactrim [sulfamethoxazole-trimethoprim] Rash 06/12/2019    Medications: Outpatient Encounter Medications as of 03/06/2023  Medication Sig   allopurinol (ZYLOPRIM) 100 MG tablet Take 1 tablet (100 mg total) by mouth daily. For gout prevention   atorvastatin (LIPITOR) 20 MG tablet TAKE 1 TABLET BY MOUTH DAILY. FOR CHOLESTEROL   B Complex-Biotin-FA (B-COMPLEX PO) Take 35 mg by mouth daily.   escitalopram (LEXAPRO) 20 MG tablet Take 1 tablet (20 mg total) by mouth daily. For anxiety.   hydrochlorothiazide (HYDRODIURIL) 25 MG tablet TAKE 1 TABLET BY MOUTH DAILY. FOR BLOOD PRESSURE.   ibuprofen (ADVIL) 200 MG tablet Take 200 mg by mouth every 6 (six) hours as needed.   metaxalone (SKELAXIN) 800 MG tablet Take 1 tablet (800 mg total) by mouth 3 (three) times daily as needed for muscle spasms.   olmesartan (BENICAR) 20 MG tablet TAKE 1 TABLET BY MOUTH DAILY. FOR BLOOD PRESSURE.   No facility-administered encounter medications on file as of 03/06/2023.    Social History: Social History   Tobacco Use   Smoking status: Never   Smokeless tobacco: Never  Vaping Use   Vaping status: Never Used  Substance Use Topics   Alcohol use: Yes   Drug use: Never    Family Medical History: Family History  Problem Relation Age of Onset   Arthritis Mother    Diabetes Mother    Heart disease Mother    Hyperlipidemia Mother    Hypertension Mother    Cancer  Father        lung   COPD Father    Arthritis Sister    Arthritis Maternal Grandmother    Diabetes Maternal Grandmother    Heart disease Maternal Grandmother    Hyperlipidemia Maternal Grandmother    Hypertension Maternal Grandmother    Arthritis Maternal Grandfather    Heart attack Maternal Grandfather    Heart disease Maternal Grandfather    Hyperlipidemia Maternal Grandfather    Hypertension Maternal Grandfather    Arthritis Paternal  Grandmother    Melanoma Paternal Grandmother    Hyperlipidemia Paternal Grandmother    Hypertension Paternal Grandmother    Arthritis Paternal Grandfather    Diabetes Paternal Grandfather    Heart attack Paternal Grandfather    Hyperlipidemia Paternal Grandfather    Hypertension Paternal Grandfather     Physical Examination: There were no vitals filed for this visit.    Awake, alert, oriented to person, place, and time.  Speech is clear and fluent. Fund of knowledge is appropriate.   Cranial Nerves: Pupils equal round and reactive to light.  Facial tone is symmetric.    Mild lower central and right sided posterior lumbar tenderness.   No abnormal lesions on exposed skin.   Strength: Side Biceps Triceps Deltoid Interossei Grip Wrist Ext. Wrist Flex.  R 5 5 5 5 5 5 5   L 5 5 5 5 5 5 5    Side Iliopsoas Quads Hamstring PF DF EHL  R 5 5 5 5 5 5   L 5 5 5 5 5 5    Reflexes are 2+ and symmetric at the biceps, brachioradialis, patella and achilles.   Hoffman's is absent.  Clonus is not present.   Bilateral upper and lower extremity sensation is intact to light touch.     Gait is normal.     Medical Decision Making  Imaging: MRI of lumbar spine dated 02/26/23:  ***  I have personally reviewed the images and agree with the above interpretation.  Report is scanned into chart under media.   Assessment and Plan: [name] has chronic constant right sided LBP and intermittent right posterior/anterior leg pain. Right leg pain started in November. No tingling or weakness. He has numbness.  He has known diffuse lumbar spondylosis and DDD with anterior spurring.   Treatment options discussed with patient and following plan made:   - MRI of lumbar spine to further evaluate right lumbar radiculopathy. No improvement with time or medications (prednisone, skelaxin, motrin). Will do WIDE BORE MRI at [facility].  - As above, no relief with prednisone. Continue on prn skelaxin  and motrin. Reviewed dosing and side effects.  - Will schedule follow up visit to review MRI results once I get them back.   BP was elevated. No symptoms of chest pain, shortness of breath, blurry vision, or headaches. He checks BP at home and it generally runs 130s/90s. He was very nervous/anxious for this visit today.   He will recheck at home and call PCP if not improved. If he develops CP, SOB, blurry vision, or headaches, then he will go to ED.     I spent a total of 35 minutes in face-to-face and non-face-to-face activities related to this patient's care today including review of outside records, review of imaging, review of symptoms, physical exam, discussion of differential diagnosis, discussion of treatment options, and documentation.   Thank you for involving me in the care of this patient.   Drake Leach PA-C Dept. of Neurosurgery

## 2023-03-06 ENCOUNTER — Ambulatory Visit (INDEPENDENT_AMBULATORY_CARE_PROVIDER_SITE_OTHER): Payer: PRIVATE HEALTH INSURANCE | Admitting: Orthopedic Surgery

## 2023-03-06 ENCOUNTER — Encounter: Payer: Self-pay | Admitting: Orthopedic Surgery

## 2023-03-06 DIAGNOSIS — M5136 Other intervertebral disc degeneration, lumbar region with discogenic back pain only: Secondary | ICD-10-CM

## 2023-03-06 DIAGNOSIS — M47816 Spondylosis without myelopathy or radiculopathy, lumbar region: Secondary | ICD-10-CM | POA: Diagnosis not present

## 2023-03-16 ENCOUNTER — Other Ambulatory Visit: Payer: Self-pay | Admitting: Primary Care

## 2023-03-16 DIAGNOSIS — M1A9XX0 Chronic gout, unspecified, without tophus (tophi): Secondary | ICD-10-CM

## 2023-03-20 ENCOUNTER — Ambulatory Visit: Payer: PRIVATE HEALTH INSURANCE | Admitting: Family Medicine

## 2023-03-20 ENCOUNTER — Encounter: Payer: Self-pay | Admitting: Family Medicine

## 2023-03-20 VITALS — BP 130/90 | HR 117 | Temp 98.0°F | Ht 73.0 in

## 2023-03-20 DIAGNOSIS — M10062 Idiopathic gout, left knee: Secondary | ICD-10-CM | POA: Diagnosis not present

## 2023-03-20 MED ORDER — TRIAMCINOLONE ACETONIDE 40 MG/ML IJ SUSP
40.0000 mg | Freq: Once | INTRAMUSCULAR | Status: AC
  Administered 2023-03-20: 40 mg via INTRA_ARTICULAR

## 2023-03-20 MED ORDER — COLCHICINE 0.6 MG PO TABS: 0.6000 mg | ORAL_TABLET | Freq: Two times a day (BID) | ORAL | 2 refills | Status: AC

## 2023-03-20 MED ORDER — INDOMETHACIN 50 MG PO CAPS: 50.0000 mg | ORAL_CAPSULE | Freq: Three times a day (TID) | ORAL | 2 refills | Status: AC

## 2023-03-20 NOTE — Progress Notes (Unsigned)
   [name] T. Kimberl Vig, MD, CAQ Sports Medicine [facility] at [facility] [address] [place], [address]  Phone: [phone]  FAX: [phone]  [name] - 48 y.o. male  MRN [id number]  Date of Birth: 23-Oct-1975  Date: 03/20/2023  PCP: Cody Nest, NP  Referral: Cody Nest, NP  Chief Complaint  Patient presents with   Gout    Left Knee   Subjective:   [name] is a 48 y.o. very pleasant male patient with Body mass index is 45.52 kg/m. who presents with the following:  Sunday had some pain underneath his leg. Now down the leg, knee has gotten swollen - feels like gout and behind  Tylenol and motrin Sunday  L gout in his L knee  Knee iinj L  Review of Systems is noted in the HPI, as appropriate  Objective:   BP (!) 130/90 (BP Location: Left Arm, Patient Position: Sitting, Cuff Size: Large)   Pulse (!) 117   Temp 98 F (36.7 C) (Temporal)   Ht 6\' 1"  (1.854 m)   SpO2 98%   BMI 45.52 kg/m   GEN: No acute distress; alert,appropriate. PULM: Breathing comfortably in no respiratory distress PSYCH: Normally interactive.   Laboratory and Imaging Data:  Assessment and Plan:   ***

## 2023-04-19 ENCOUNTER — Telehealth: Payer: Self-pay | Admitting: Orthopedic Surgery

## 2023-04-19 NOTE — Telephone Encounter (Signed)
 He has appt with me on Wednesday for f/u after PT. Can we get his PT notes from [facility] in [place] please?   Thanks!

## 2023-04-19 NOTE — Progress Notes (Deleted)
 Referring Physician:  Doreene Nest, NP [address] [place],  [place] [address]  Primary Physician:  Doreene Nest, NP  History of Present Illness: 04/19/2023 [name] has a history of anxiety, HTN, obesity, hypertriglyceridemia, and gout.    Did phone visit with me on 03/06/23. He has known diffuse lumbar spondylosis and DDD with multilevel disc bulging and foraminal stenosis. Worst levels appear to be L3-L4 and L4-L5.    LBP is likely due to underlying DDD/spondylosis.   His LBP was improving at his last visit. He was sent to PT and is here for follow up.        He is doing a little better since his last visit. He continues with chronic more constant right sided LBP that varies in severity. His right leg pain has improved, it is mostly gone. Pain is worse with lifting. Pain is better with laying flat/still. No tingling or weakness. He has numbness.    He continues on skelaxin and advil.    He states his blood pressure has been better- it was elevated at his last visit.    He does not smoke.    Bowel/Bladder Dysfunction: none   Conservative measures:  Physical therapy: [facility] to [facility] at [facility]*** Multimodal medical therapy including regular antiinflammatories: Prednisone, Hydrocodone, Metaxalone  Injections: no epidural steroid injections   Past Surgery: none    Review of Systems:  A 10 point review of systems is negative, except for the pertinent positives and negatives detailed in the HPI.  Past Medical History: Past Medical History:  Diagnosis Date   Anorectal abscess    Cellulitis of scrotum 04/21/2019   Chickenpox 06/17/2017   Epididymitis 06/05/2019   Erythrasma 2020   Essential hypertension 04/25/2017   Hydradenitis    Hyperlipidemia    Hypertension    Hypertriglyceridemia 04/25/2017   Scrotal pain 06/05/2019    Past Surgical History: No past surgical history on file.  Allergies: Allergies as of 04/24/2023 - Review  Complete 03/20/2023  Allergen Reaction Noted   Bactrim [sulfamethoxazole-trimethoprim] Rash 06/12/2019    Medications: Outpatient Encounter Medications as of 04/24/2023  Medication Sig   allopurinol (ZYLOPRIM) 100 MG tablet TAKE 1 TABLET (100 MG TOTAL) BY MOUTH DAILY. FOR GOUT PREVENTION   atorvastatin (LIPITOR) 20 MG tablet TAKE 1 TABLET BY MOUTH DAILY. FOR CHOLESTEROL   B Complex-Biotin-FA (B-COMPLEX PO) Take 35 mg by mouth daily.   colchicine 0.6 MG tablet Take 1 tablet (0.6 mg total) by mouth 2 (two) times daily.   escitalopram (LEXAPRO) 20 MG tablet Take 1 tablet (20 mg total) by mouth daily. For anxiety.   hydrochlorothiazide (HYDRODIURIL) 25 MG tablet TAKE 1 TABLET BY MOUTH DAILY. FOR BLOOD PRESSURE.   ibuprofen (ADVIL) 200 MG tablet Take 200 mg by mouth every 6 (six) hours as needed.   indomethacin (INDOCIN) 50 MG capsule Take 1 capsule (50 mg total) by mouth 3 (three) times daily with meals.   metaxalone (SKELAXIN) 800 MG tablet Take 1 tablet (800 mg total) by mouth 3 (three) times daily as needed for muscle spasms.   olmesartan (BENICAR) 20 MG tablet TAKE 1 TABLET BY MOUTH DAILY. FOR BLOOD PRESSURE.   No facility-administered encounter medications on file as of 04/24/2023.    Social History: Social History   Tobacco Use   Smoking status: Never   Smokeless tobacco: Never  Vaping Use   Vaping status: Never Used  Substance Use Topics   Alcohol use: Yes   Drug use:  Never    Family Medical History: Family History  Problem Relation Age of Onset   Arthritis Mother    Diabetes Mother    Heart disease Mother    Hyperlipidemia Mother    Hypertension Mother    Cancer Father        lung   COPD Father    Arthritis Sister    Arthritis Maternal Grandmother    Diabetes Maternal Grandmother    Heart disease Maternal Grandmother    Hyperlipidemia Maternal Grandmother    Hypertension Maternal Grandmother    Arthritis Maternal Grandfather    Heart attack Maternal Grandfather     Heart disease Maternal Grandfather    Hyperlipidemia Maternal Grandfather    Hypertension Maternal Grandfather    Arthritis Paternal Grandmother    Melanoma Paternal Grandmother    Hyperlipidemia Paternal Grandmother    Hypertension Paternal Grandmother    Arthritis Paternal Grandfather    Diabetes Paternal Grandfather    Heart attack Paternal Grandfather    Hyperlipidemia Paternal Grandfather    Hypertension Paternal Grandfather     Physical Examination: There were no vitals filed for this visit.    Awake, alert, oriented to person, place, and time.  Speech is clear and fluent. Fund of knowledge is appropriate.   Cranial Nerves: Pupils equal round and reactive to light.  Facial tone is symmetric.    Mild lower central and right sided posterior lumbar tenderness.   No abnormal lesions on exposed skin.   Strength: Side Biceps Triceps Deltoid Interossei Grip Wrist Ext. Wrist Flex.  R 5 5 5 5 5 5 5   L 5 5 5 5 5 5 5    Side Iliopsoas Quads Hamstring PF DF EHL  R 5 5 5 5 5 5   L 5 5 5 5 5 5    Reflexes are 2+ and symmetric at the biceps, brachioradialis, patella and achilles.   Hoffman's is absent.  Clonus is not present.   Bilateral upper and lower extremity sensation is intact to light touch.     Gait is normal.     Medical Decision Making  Imaging: none  Assessment and Plan: Mr. Clasby has chronic more constant right sided LBP. His right leg pain has improved and is mostly gone. No tingling or weakness. He has numbness.   He has known diffuse lumbar spondylosis and DDD with multilevel disc bulging and foraminal stenosis. Worst levels appear to be L3-L4 and L4-L5.    LBP is likely due to underlying DDD/spondylosis.    Treatment options discussed with patient and following plan made:    - PT orders to [facility] in [place].  - Discussed injections. Will hold off as LBP is improving. May revisit if pain gets worse.  - Continue on prn skelaxin and motrin.  Reviewed dosing and side effects.  - Follow up with me in 6-8 weeks and prn.   I spent a total of *** minutes in face-to-face and non-face-to-face activities related to this patient's care today including review of outside records, review of imaging, review of symptoms, physical exam, discussion of differential diagnosis, discussion of treatment options, and documentation.   Drake Leach PA-C Dept. of Neurosurgery

## 2023-04-24 ENCOUNTER — Ambulatory Visit: Payer: PRIVATE HEALTH INSURANCE | Admitting: Orthopedic Surgery

## 2023-04-24 NOTE — Telephone Encounter (Signed)
 Samantha from [facility] said that patient declined the referral.

## 2023-04-24 NOTE — Telephone Encounter (Signed)
 [name] a message to send over notes.

## 2023-06-07 ENCOUNTER — Encounter: Payer: Self-pay | Admitting: Primary Care

## 2023-06-07 ENCOUNTER — Ambulatory Visit (INDEPENDENT_AMBULATORY_CARE_PROVIDER_SITE_OTHER)
Admission: RE | Admit: 2023-06-07 | Discharge: 2023-06-07 | Disposition: A | Discharge status: Home / Self Care | Payer: PRIVATE HEALTH INSURANCE | Source: Ambulatory Visit | Attending: Primary Care | Admitting: Primary Care

## 2023-06-07 ENCOUNTER — Ambulatory Visit (INDEPENDENT_AMBULATORY_CARE_PROVIDER_SITE_OTHER): Payer: PRIVATE HEALTH INSURANCE | Admitting: Primary Care

## 2023-06-07 VITALS — BP 118/74 | HR 89 | Temp 97.1°F | Ht 73.0 in | Wt 350.0 lb

## 2023-06-07 DIAGNOSIS — R350 Frequency of micturition: Secondary | ICD-10-CM | POA: Diagnosis not present

## 2023-06-07 LAB — POC URINALSYSI DIPSTICK (AUTOMATED)
Bilirubin, UA: NEGATIVE
Blood, UA: POSITIVE
Glucose, UA: NEGATIVE
Ketones, UA: NEGATIVE
Leukocytes, UA: NEGATIVE
Nitrite, UA: NEGATIVE
Protein, UA: NEGATIVE
Spec Grav, UA: 1.015 (ref 1.010–1.025)
Urobilinogen, UA: 0.2 U/dL
pH, UA: 6 (ref 5.0–8.0)

## 2023-06-07 LAB — POCT GLYCOSYLATED HEMOGLOBIN (HGB A1C): Hemoglobin A1C: 5.1 % (ref 4.0–5.6)

## 2023-06-07 MED ORDER — LEVOFLOXACIN 500 MG PO TABS: 500.0000 mg | ORAL_TABLET | Freq: Every day | ORAL | 0 refills | Status: AC

## 2023-06-07 NOTE — Assessment & Plan Note (Addendum)
 Differentials include acute prostatitis, BPH, renal stone.  Urinalysis today with trace blood, otherwise unremarkable. Urine culture ordered and pending.  DG KUB ordered to evaluate for renal stone. A1C today of 5.1.   Labs pending today including CBC with diff and BMP. Start levofloxacin 500 mg tablets daily x 7 days.  May need to extend if symptoms are improving.  If no symptom improvement then consider tamsulosin 0.4 mg daily.  He will update early next week.

## 2023-06-07 NOTE — Progress Notes (Signed)
 Subjective:    Patient ID: [name], male    DOB: 03/20/1975, 48 y.o.   MRN: [id number]  Urinary Frequency  Associated symptoms include flank pain, frequency and urgency. Pertinent negatives include no chills or hematuria.    [name] is a very pleasant 48 y.o. male with a history of hypertension, gout, hypertriglyceridemia who presents today to discuss urinary frequency.  Symptom onset three weeks ago with nocturia (3-5 times nightly), urinary urgency with leakage, a sensation of incomplete bladder emptying, dysuria, intermittent groin pain, flank pain.   He denies fevers, hematuria, new supplements. He is drinking plenty of water daily. Denies polydipsia, polyphagia. He denies a family history of prostate issues. He's never personally had prostate problems.    Review of Systems  Constitutional:  Negative for chills and fever.  Gastrointestinal:  Negative for abdominal pain.  Genitourinary:  Positive for flank pain, frequency and urgency. Negative for hematuria.         Past Medical History:  Diagnosis Date   Anorectal abscess    Cellulitis of scrotum 04/21/2019   Chickenpox 06/17/2017   Epididymitis 06/05/2019   Erythrasma 2020   Essential hypertension 04/25/2017   Hydradenitis    Hyperlipidemia    Hypertension    Hypertriglyceridemia 04/25/2017   Scrotal pain 06/05/2019    Social History   Socioeconomic History   Marital status: Married    Spouse name: Not on file   Number of children: Not on file   Years of education: Not on file   Highest education level: Associate degree: occupational, Scientist, product/process development, or vocational program  Occupational History   Not on file  Tobacco Use   Smoking status: Never   Smokeless tobacco: Never  Vaping Use   Vaping status: Never Used  Substance and Sexual Activity   Alcohol use: Yes   Drug use: Never   Sexual activity: Not on file  Other Topics Concern   Not on file  Social History Narrative    Married.   No children.   Works as Medical illustrator.    Enjoys playing guitar, lifting weights, walking.   Social Drivers of Health   Financial Resource Strain: Medium Risk (03/19/2023)   Overall Financial Resource Strain (CARDIA)    Difficulty of Paying Living Expenses: Somewhat hard  Food Insecurity: No Food Insecurity (03/19/2023)   Hunger Vital Sign    Worried About Running Out of Food in the Last Year: Never true    Ran Out of Food in the Last Year: Never true  Transportation Needs: No Transportation Needs (03/19/2023)   PRAPARE - Administrator, Civil Service (Medical): No    Lack of Transportation (Non-Medical): No  Physical Activity: Sufficiently Active (03/19/2023)   Exercise Vital Sign    Days of Exercise per Week: 3 days    Minutes of Exercise per Session: 50 min  Stress: No Stress Concern Present (03/19/2023)   [employer] of Occupational Health - Occupational Stress Questionnaire    Feeling of Stress : Only a little  Social Connections: Moderately Integrated (03/19/2023)   Social Connection and Isolation Panel [NHANES]    Frequency of Communication with Friends and Family: Three times a week    Frequency of Social Gatherings with Friends and Family: Once a week    Attends Religious Services: 1 to 4 times per year    Active Member of [employer] or Organizations: No    Attends Banker Meetings: Not on file    Marital Status:  Married  Intimate Partner Violence: Not on file    History reviewed. No pertinent surgical history.  Family History  Problem Relation Age of Onset   Arthritis Mother    Diabetes Mother    Heart disease Mother    Hyperlipidemia Mother    Hypertension Mother    Cancer Father        lung   COPD Father    Arthritis Sister    Arthritis Maternal Grandmother    Diabetes Maternal Grandmother    Heart disease Maternal Grandmother    Hyperlipidemia Maternal Grandmother    Hypertension Maternal Grandmother    Arthritis  Maternal Grandfather    Heart attack Maternal Grandfather    Heart disease Maternal Grandfather    Hyperlipidemia Maternal Grandfather    Hypertension Maternal Grandfather    Arthritis Paternal Grandmother    Melanoma Paternal Grandmother    Hyperlipidemia Paternal Grandmother    Hypertension Paternal Grandmother    Arthritis Paternal Grandfather    Diabetes Paternal Grandfather    Heart attack Paternal Grandfather    Hyperlipidemia Paternal Grandfather    Hypertension Paternal Grandfather     Allergies  Allergen Reactions   Bactrim  [Sulfamethoxazole -Trimethoprim ] Rash    Current Outpatient Medications on File Prior to Visit  Medication Sig Dispense Refill   allopurinol  (ZYLOPRIM ) 100 MG tablet TAKE 1 TABLET (100 MG TOTAL) BY MOUTH DAILY. FOR GOUT PREVENTION 90 tablet 1   Ascorbic Acid (VITAMIN C) 100 MG tablet Take 100 mg by mouth daily.     atorvastatin  (LIPITOR) 20 MG tablet TAKE 1 TABLET BY MOUTH DAILY. FOR CHOLESTEROL     B Complex-Biotin-FA (B-COMPLEX PO) Take 35 mg by mouth daily.     colchicine  0.6 MG tablet Take 1 tablet (0.6 mg total) by mouth 2 (two) times daily. 60 tablet 2   escitalopram  (LEXAPRO ) 20 MG tablet Take 1 tablet (20 mg total) by mouth daily. For anxiety. 90 tablet 3   hydrochlorothiazide  (HYDRODIURIL ) 25 MG tablet TAKE 1 TABLET BY MOUTH DAILY. FOR BLOOD PRESSURE. 90 tablet 3   ibuprofen (ADVIL) 200 MG tablet Take 200 mg by mouth every 6 (six) hours as needed.     indomethacin  (INDOCIN ) 50 MG capsule Take 1 capsule (50 mg total) by mouth 3 (three) times daily with meals. 50 capsule 2   metaxalone  (SKELAXIN ) 800 MG tablet Take 1 tablet (800 mg total) by mouth 3 (three) times daily as needed for muscle spasms. 30 tablet 0   olmesartan  (BENICAR ) 20 MG tablet TAKE 1 TABLET BY MOUTH DAILY. FOR BLOOD PRESSURE. 90 tablet 3   No current facility-administered medications on file prior to visit.    BP 118/74   Pulse 89   Temp (!) 97.1 F (36.2 C) (Temporal)    Ht 6\' 1"  (1.854 m)   Wt (!) 350 lb (158.8 kg)   SpO2 96%   BMI 46.18 kg/m  Objective:   Physical Exam Cardiovascular:     Rate and Rhythm: Normal rate and regular rhythm.  Pulmonary:     Effort: Pulmonary effort is normal.  Abdominal:     Palpations: Abdomen is soft.     Tenderness: There is no abdominal tenderness. There is no right CVA tenderness or left CVA tenderness.  Musculoskeletal:     Cervical back: Neck supple.  Skin:    General: Skin is warm and dry.  Neurological:     Mental Status: He is alert and oriented to person, place, and time.  Psychiatric:  Mood and Affect: Mood normal.           Assessment & Plan:  Urinary frequency Assessment & Plan: Differentials include acute prostatitis, BPH, renal stone.  Urinalysis today with trace blood, otherwise unremarkable. Urine culture ordered and pending.  DG KUB ordered to evaluate for renal stone. A1C today of 5.1.   Labs pending today including CBC with diff and BMP. Start levofloxacin 500 mg tablets daily x 7 days.  May need to extend if symptoms are improving.  If no symptom improvement then consider tamsulosin 0.4 mg daily.  He will update early next week.  Orders: -     POCT glycosylated hemoglobin (Hb A1C) -     Basic metabolic panel with GFR -     CBC with Differential/Platelet -     DG Abd 1 View -     Urine Culture -     POCT Urinalysis Dipstick (Automated) -     levoFLOXacin; Take 1 tablet (500 mg total) by mouth daily for 7 days.  Dispense: 7 tablet; Refill: 0        Gabriel John, NP

## 2023-06-07 NOTE — Patient Instructions (Signed)
 Start levofloxacin 500 mg antibiotics for presumed prostate infection. Take 1 tablet by mouth once daily.  Stop by the lab and xray prior to leaving today. I will notify you of your results once received.   Please update me Tuesday next week.   It was a pleasure to see you today!

## 2023-06-08 LAB — BASIC METABOLIC PANEL WITH GFR
BUN: 18 mg/dL (ref 7–25)
CO2: 27 mmol/L (ref 20–32)
Calcium: 9.4 mg/dL (ref 8.6–10.3)
Chloride: 98 mmol/L (ref 98–110)
Creat: 0.77 mg/dL (ref 0.60–1.29)
Glucose, Bld: 88 mg/dL (ref 65–99)
Potassium: 4.2 mmol/L (ref 3.5–5.3)
Sodium: 136 mmol/L (ref 135–146)
eGFR: 111 mL/min/{1.73_m2} (ref >=60)

## 2023-06-08 LAB — CBC WITH DIFFERENTIAL/PLATELET
Absolute Lymphocytes: 2271 {cells}/uL (ref 850–3900)
Absolute Monocytes: 922 {cells}/uL (ref 200–950)
Basophils Absolute: 67 {cells}/uL (ref 0–200)
Basophils Relative: 0.7 %
Eosinophils Absolute: 162 {cells}/uL (ref 15–500)
Eosinophils Relative: 1.7 %
HCT: 39.8 % (ref 38.5–50.0)
Hemoglobin: 13.2 g/dL (ref 13.2–17.1)
MCH: 31.1 pg (ref 27.0–33.0)
MCHC: 33.2 g/dL (ref 32.0–36.0)
MCV: 93.9 fL (ref 80.0–100.0)
MPV: 9.2 fL (ref 7.5–12.5)
Monocytes Relative: 9.7 %
Neutro Abs: 6080 {cells}/uL (ref 1500–7800)
Neutrophils Relative %: 64 %
Platelets: 265 10*3/uL (ref 140–400)
RBC: 4.24 10*6/uL (ref 4.20–5.80)
RDW: 13.1 % (ref 11.0–15.0)
Total Lymphocyte: 23.9 %
WBC: 9.5 10*3/uL (ref 3.8–10.8)

## 2023-06-08 LAB — URINE CULTURE
MICRO NUMBER:: 16495067
Result:: NO GROWTH
SPECIMEN QUALITY:: ADEQUATE

## 2023-06-09 ENCOUNTER — Ambulatory Visit: Payer: Self-pay | Admitting: Primary Care

## 2023-06-09 DIAGNOSIS — R350 Frequency of micturition: Secondary | ICD-10-CM

## 2023-06-11 MED ORDER — LEVOFLOXACIN 500 MG PO TABS: 500.0000 mg | ORAL_TABLET | Freq: Every day | ORAL | 0 refills | Status: AC

## 2023-09-01 ENCOUNTER — Other Ambulatory Visit: Payer: Self-pay | Admitting: Primary Care

## 2023-09-01 DIAGNOSIS — M1A9XX0 Chronic gout, unspecified, without tophus (tophi): Secondary | ICD-10-CM

## 2023-09-01 NOTE — Telephone Encounter (Signed)
 Patient is due for CPE/follow up in early October, this will be required prior to any further refills.  Please schedule, thank you!

## 2023-09-02 NOTE — Telephone Encounter (Signed)
 lvm for pt to call office to schedule appt.

## 2023-09-03 NOTE — Telephone Encounter (Signed)
 Lvmtcb, sent mychart

## 2023-09-24 ENCOUNTER — Ambulatory Visit: Payer: PRIVATE HEALTH INSURANCE | Admitting: Primary Care

## 2023-09-27 ENCOUNTER — Ambulatory Visit: Payer: PRIVATE HEALTH INSURANCE | Admitting: Primary Care

## 2023-10-01 ENCOUNTER — Other Ambulatory Visit: Payer: Self-pay | Admitting: Primary Care

## 2023-10-01 DIAGNOSIS — F411 Generalized anxiety disorder: Secondary | ICD-10-CM

## 2023-10-01 DIAGNOSIS — E781 Pure hyperglyceridemia: Secondary | ICD-10-CM

## 2023-10-01 DIAGNOSIS — I10 Essential (primary) hypertension: Secondary | ICD-10-CM

## 2023-10-22 ENCOUNTER — Ambulatory Visit: Payer: PRIVATE HEALTH INSURANCE | Admitting: Primary Care

## 2023-10-22 ENCOUNTER — Encounter: Payer: Self-pay | Admitting: Primary Care

## 2023-10-22 ENCOUNTER — Ambulatory Visit: Payer: Self-pay | Admitting: Primary Care

## 2023-10-22 VITALS — BP 138/76 | HR 86 | Temp 97.0°F | Ht 73.0 in | Wt 347.0 lb

## 2023-10-22 DIAGNOSIS — I10 Essential (primary) hypertension: Secondary | ICD-10-CM | POA: Diagnosis not present

## 2023-10-22 DIAGNOSIS — M5441 Lumbago with sciatica, right side: Secondary | ICD-10-CM

## 2023-10-22 DIAGNOSIS — G8929 Other chronic pain: Secondary | ICD-10-CM | POA: Diagnosis not present

## 2023-10-22 DIAGNOSIS — M1A9XX0 Chronic gout, unspecified, without tophus (tophi): Secondary | ICD-10-CM | POA: Diagnosis not present

## 2023-10-22 DIAGNOSIS — F411 Generalized anxiety disorder: Secondary | ICD-10-CM

## 2023-10-22 DIAGNOSIS — R300 Dysuria: Secondary | ICD-10-CM

## 2023-10-22 DIAGNOSIS — E781 Pure hyperglyceridemia: Secondary | ICD-10-CM | POA: Diagnosis not present

## 2023-10-22 DIAGNOSIS — Z0001 Encounter for general adult medical examination with abnormal findings: Secondary | ICD-10-CM

## 2023-10-22 LAB — COMPREHENSIVE METABOLIC PANEL WITH GFR
ALT: 30 U/L (ref 0–53)
AST: 30 U/L (ref 0–37)
Albumin: 4.4 g/dL (ref 3.5–5.2)
Alkaline Phosphatase: 55 U/L (ref 39–117)
BUN: 36 mg/dL — ABNORMAL HIGH (ref 6–23)
CO2: 25 meq/L (ref 19–32)
Calcium: 9.3 mg/dL (ref 8.4–10.5)
Chloride: 97 meq/L (ref 96–112)
Creatinine, Ser: 1.47 mg/dL (ref 0.40–1.50)
GFR: 56.45 mL/min — ABNORMAL LOW (ref >60.00)
Glucose, Bld: 91 mg/dL (ref 70–99)
Potassium: 4.5 meq/L (ref 3.5–5.1)
Sodium: 136 meq/L (ref 135–145)
Total Bilirubin: 0.4 mg/dL (ref 0.2–1.2)
Total Protein: 7.9 g/dL (ref 6.0–8.3)

## 2023-10-22 LAB — LIPID PANEL
Cholesterol: 156 mg/dL (ref 0–200)
HDL: 43 mg/dL (ref >39.00)
NonHDL: 112.74
Total CHOL/HDL Ratio: 4
Triglycerides: 513 mg/dL — ABNORMAL HIGH (ref 0.0–149.0)
VLDL: 102.6 mg/dL — ABNORMAL HIGH (ref 0.0–40.0)

## 2023-10-22 LAB — LDL CHOLESTEROL, DIRECT: Direct LDL: 67 mg/dL

## 2023-10-22 LAB — URIC ACID: Uric Acid, Serum: 8.5 mg/dL — ABNORMAL HIGH (ref 4.0–7.8)

## 2023-10-22 MED ORDER — SERTRALINE HCL 50 MG PO TABS: 50.0000 mg | ORAL_TABLET | Freq: Every day | ORAL | 0 refills | Status: DC

## 2023-10-22 NOTE — Patient Instructions (Addendum)
 Stop by the lab prior to leaving today. I will notify you of your results once received.   Stop taking Lexapro  20 mg daily for anxiety.  Start taking Zoloft 50 mg daily for anxiety.  Please keep me updated regarding your symptoms.  It was a pleasure to see you today!

## 2023-10-22 NOTE — Assessment & Plan Note (Signed)
 No recent flares.  Continue allopurinol  100 mg daily and colchicine /indomethacin  PRN Uric acid level pending.

## 2023-10-22 NOTE — Progress Notes (Signed)
 Subjective:    Patient ID: [name], male    DOB: [birth year], 48 y.o.   MRN: [id number]  [name] is a very pleasant 48 y.o. male who presents today for complete physical and follow up of chronic conditions.  He would also like to discuss anxiety. Chronic history of anxiety, but over the last 1 year his symptoms have increased. Symptoms include worrying, feeling overwhelmed, trouble relaxing, feeling down/sad.  He wants felt that Lexapro  was effective but no longer feels this is the case.  He has not tried other treatment for anxiety.     10/22/2023    7:56 AM 04/12/2020    8:44 AM 05/09/2017    8:50 AM  GAD 7 : Generalized Anxiety Score  Nervous, Anxious, on Edge 2 0 2  Control/stop worrying 2 0 2  Worry too much - different things 2 1 2   Trouble relaxing 2 0 1  Restless 2 0 3  Easily annoyed or irritable 1 0 0  Afraid - awful might happen 0 0 2  Total GAD 7 Score 11 1 12   Anxiety Difficulty Somewhat difficult Not difficult at all Somewhat difficult      10/22/2023    7:56 AM 10/10/2022   10:59 AM 09/07/2021    2:51 PM  PHQ9 SCORE ONLY  PHQ-9 Total Score 4 0  0      Data saved with a previous flowsheet row definition      Immunizations: -Tetanus: Completed in 2016 -Influenza: Declines influenza vaccine.  Diet: Fair diet.  Exercise: No regular exercise. Some walking  Eye exam: Completes annually  Dental exam: Completes semi-annually    Colonoscopy: Never completed.   BP Readings from Last 3 Encounters:  10/22/23 138/76  06/07/23 118/74  03/20/23 (!) 130/90    Wt Readings from Last 3 Encounters:  10/22/23 (!) 347 lb (157.4 kg)  06/07/23 (!) 350 lb (158.8 kg)  01/28/23 (!) 345 lb (156.5 kg)     Review of Systems  Constitutional:  Negative for unexpected weight change.  HENT:  Negative for rhinorrhea.   Respiratory:  Negative for cough and shortness of breath.   Cardiovascular:  Negative for chest pain.  Gastrointestinal:   Negative for constipation and diarrhea.  Genitourinary:  Negative for difficulty urinating.  Musculoskeletal:  Negative for arthralgias and myalgias.  Skin:  Negative for rash.  Allergic/Immunologic: Negative for environmental allergies.  Neurological:  Negative for dizziness, numbness and headaches.         Past Medical History:  Diagnosis Date   Anorectal abscess    Cellulitis of scrotum 04/21/2019   Chickenpox 06/17/2017   Epididymitis 06/05/2019   Erythrasma 2020   Essential hypertension 04/25/2017   Hydradenitis    Hyperlipidemia    Hypertension    Hypertriglyceridemia 04/25/2017   Scrotal pain 06/05/2019    Social History   Socioeconomic History   Marital status: Married    Spouse name: Not on file   Number of children: Not on file   Years of education: Not on file   Highest education level: Associate degree: occupational, Scientist, product/process development, or vocational program  Occupational History   Not on file  Tobacco Use   Smoking status: Never   Smokeless tobacco: Never  Vaping Use   Vaping status: Never Used  Substance and Sexual Activity   Alcohol use: Yes   Drug use: Never   Sexual activity: Not on file  Other Topics Concern   Not on file  Social History Narrative  Married.   No children.   Works as Medical illustrator.    Enjoys playing guitar, lifting weights, walking.   Social Drivers of Health   Financial Resource Strain: Medium Risk (10/21/2023)   Overall Financial Resource Strain (CARDIA)    Difficulty of Paying Living Expenses: Somewhat hard  Food Insecurity: No Food Insecurity (10/21/2023)   Hunger Vital Sign    Worried About Running Out of Food in the Last Year: Never true    Ran Out of Food in the Last Year: Never true  Transportation Needs: No Transportation Needs (10/21/2023)   PRAPARE - Administrator, Civil Service (Medical): No    Lack of Transportation (Non-Medical): No  Physical Activity: Insufficiently Active (10/21/2023)    Exercise Vital Sign    Days of Exercise per Week: 2 days    Minutes of Exercise per Session: 20 min  Stress: Stress Concern Present (10/21/2023)   [employer] of Occupational Health - Occupational Stress Questionnaire    Feeling of Stress: To some extent  Social Connections: Moderately Isolated (10/21/2023)   Social Connection and Isolation Panel    Frequency of Communication with Friends and Family: More than three times a week    Frequency of Social Gatherings with Friends and Family: Once a week    Attends Religious Services: Never    Database administrator or Organizations: No    Attends Engineer, structural: Not on file    Marital Status: Married  Catering manager Violence: Not on file    History reviewed. No pertinent surgical history.  Family History  Problem Relation Age of Onset   Arthritis Mother    Diabetes Mother    Heart disease Mother    Hyperlipidemia Mother    Hypertension Mother    Cancer Father        lung   COPD Father    Arthritis Sister    Arthritis Maternal Grandmother    Diabetes Maternal Grandmother    Heart disease Maternal Grandmother    Hyperlipidemia Maternal Grandmother    Hypertension Maternal Grandmother    Arthritis Maternal Grandfather    Heart attack Maternal Grandfather    Heart disease Maternal Grandfather    Hyperlipidemia Maternal Grandfather    Hypertension Maternal Grandfather    Arthritis Paternal Grandmother    Melanoma Paternal Grandmother    Hyperlipidemia Paternal Grandmother    Hypertension Paternal Grandmother    Arthritis Paternal Grandfather    Diabetes Paternal Grandfather    Heart attack Paternal Grandfather    Hyperlipidemia Paternal Grandfather    Hypertension Paternal Grandfather     Allergies  Allergen Reactions   Bactrim  [Sulfamethoxazole -Trimethoprim ] Rash    Current Outpatient Medications on File Prior to Visit  Medication Sig Dispense Refill   allopurinol  (ZYLOPRIM ) 100 MG tablet TAKE 1  TABLET (100 MG TOTAL) BY MOUTH DAILY. FOR GOUT PREVENTION 90 tablet 0   Ascorbic Acid (VITAMIN C) 100 MG tablet Take 100 mg by mouth daily.     B Complex-Biotin-FA (B-COMPLEX PO) Take 35 mg by mouth daily.     hydrochlorothiazide  (HYDRODIURIL ) 25 MG tablet TAKE 1 TABLET BY MOUTH DAILY FOR BLOOD PRESSURE 30 tablet 0   ibuprofen (ADVIL) 200 MG tablet Take 200 mg by mouth every 6 (six) hours as needed.     metaxalone  (SKELAXIN ) 800 MG tablet Take 1 tablet (800 mg total) by mouth 3 (three) times daily as needed for muscle spasms. 30 tablet 0   olmesartan  (BENICAR ) 20 MG tablet  TAKE 1 TABLET BY MOUTH DAILY FOR BLOOD PRESSURE 30 tablet 0   rosuvastatin  (CRESTOR ) 10 MG tablet TAKE 1 TABLET BY MOUTH DAILY. FOR CHOLESTEROL 30 tablet 0   colchicine  0.6 MG tablet Take 1 tablet (0.6 mg total) by mouth 2 (two) times daily. (Patient not taking: Reported on 10/22/2023) 60 tablet 2   indomethacin  (INDOCIN ) 50 MG capsule Take 1 capsule (50 mg total) by mouth 3 (three) times daily with meals. (Patient not taking: Reported on 10/22/2023) 50 capsule 2   No current facility-administered medications on file prior to visit.    BP 138/76   Pulse 86   Temp (!) 97 F (36.1 C) (Temporal)   Ht 6' 1 (1.854 m)   Wt (!) 347 lb (157.4 kg)   SpO2 96%   BMI 45.78 kg/m  Objective:   Physical Exam HENT:     Right Ear: Tympanic membrane and ear canal normal.     Left Ear: Tympanic membrane and ear canal normal.  Eyes:     Pupils: Pupils are equal, round, and reactive to light.  Cardiovascular:     Rate and Rhythm: Normal rate and regular rhythm.  Pulmonary:     Effort: Pulmonary effort is normal.     Breath sounds: Normal breath sounds.  Abdominal:     General: Bowel sounds are normal.     Palpations: Abdomen is soft.     Tenderness: There is no abdominal tenderness.  Musculoskeletal:        General: Normal range of motion.     Cervical back: Neck supple.  Skin:    General: Skin is warm and dry.  Neurological:      Mental Status: He is alert and oriented to person, place, and time.     Cranial Nerves: No cranial nerve deficit.     Deep Tendon Reflexes:     Reflex Scores:      Patellar reflexes are 2+ on the right side and 2+ on the left side. Psychiatric:        Mood and Affect: Mood normal.     Physical Exam        Assessment & Plan:  Encounter for annual general medical examination with abnormal findings in adult Assessment & Plan: Immunizations UTD. Declines influenza vaccine.' Colonoscopy due, he would like to wait until he gets better insurance and will notify when ready  Discussed the importance of a healthy diet and regular exercise in order for weight loss, and to reduce the risk of further co-morbidity.  Exam stable. Labs pending.  Follow up in 1 year for repeat physical.    Essential hypertension Assessment & Plan: Borderline high.  Discussed exercise and healthy diet.  Continue hydrochlorothiazide  25 mg daily, olmesartan  20 mg daily.   CMP pending.  Orders: -     Comprehensive metabolic panel with GFR  Chronic right-sided low back pain with right-sided sciatica Assessment & Plan: Stable.  Continue Skelaxin  800 mg TID PRN.    Chronic gout involving toe without tophus, unspecified cause, unspecified laterality Assessment & Plan: No recent flares.  Continue allopurinol  100 mg daily and colchicine /indomethacin  PRN Uric acid level pending.  Orders: -     Uric acid  Hypertriglyceridemia Assessment & Plan: Repeat lipid panel pending.  Continue rosuvastatin  10 mg daily.  Orders: -     Lipid panel  GAD (generalized anxiety disorder) Assessment & Plan: Deteriorated.  Will switch from Lexapro  20 mg to Zoloft 50 mg daily. He will update  Orders: -     Sertraline HCl; Take 1 tablet (50 mg total) by mouth daily. for anxiety and depression.  Dispense: 90 tablet; Refill: 0    Assessment and Plan Assessment & Plan         Comer MARLA Gaskins, NP   History of Present Illness

## 2023-10-22 NOTE — Assessment & Plan Note (Signed)
 Repeat lipid panel pending. Continue rosuvastatin 10 mg daily.

## 2023-10-22 NOTE — Assessment & Plan Note (Signed)
 Borderline high.  Discussed exercise and healthy diet.  Continue hydrochlorothiazide  25 mg daily, olmesartan  20 mg daily.   CMP pending.

## 2023-10-22 NOTE — Assessment & Plan Note (Signed)
 Deteriorated.  Will switch from Lexapro  20 mg to Zoloft 50 mg daily. He will update

## 2023-10-22 NOTE — Assessment & Plan Note (Signed)
 Immunizations UTD. Declines influenza vaccine.' Colonoscopy due, he would like to wait until he gets better insurance and will notify when ready  Discussed the importance of a healthy diet and regular exercise in order for weight loss, and to reduce the risk of further co-morbidity.  Exam stable. Labs pending.  Follow up in 1 year for repeat physical.

## 2023-10-22 NOTE — Assessment & Plan Note (Signed)
 Stable.  Continue Skelaxin  800 mg TID PRN.

## 2023-10-24 ENCOUNTER — Other Ambulatory Visit (INDEPENDENT_AMBULATORY_CARE_PROVIDER_SITE_OTHER): Payer: PRIVATE HEALTH INSURANCE

## 2023-10-24 DIAGNOSIS — R300 Dysuria: Secondary | ICD-10-CM

## 2023-10-24 LAB — URINALYSIS, ROUTINE W REFLEX MICROSCOPIC
Bilirubin Urine: NEGATIVE
Ketones, ur: NEGATIVE
Leukocytes,Ua: NEGATIVE
Nitrite: NEGATIVE
Specific Gravity, Urine: 1.01 (ref 1.000–1.030)
Total Protein, Urine: NEGATIVE
Urine Glucose: NEGATIVE
Urobilinogen, UA: 0.2 (ref 0.0–1.0)
pH: 6 (ref 5.0–8.0)

## 2023-10-25 ENCOUNTER — Other Ambulatory Visit: Payer: Self-pay | Admitting: Primary Care

## 2023-10-25 DIAGNOSIS — I10 Essential (primary) hypertension: Secondary | ICD-10-CM

## 2023-10-25 DIAGNOSIS — E781 Pure hyperglyceridemia: Secondary | ICD-10-CM

## 2023-10-25 LAB — URINE CULTURE
MICRO NUMBER:: 17079096
Result:: NO GROWTH
SPECIMEN QUALITY:: ADEQUATE

## 2023-10-27 ENCOUNTER — Ambulatory Visit: Payer: Self-pay | Admitting: Primary Care

## 2023-10-27 DIAGNOSIS — R350 Frequency of micturition: Secondary | ICD-10-CM

## 2023-10-27 DIAGNOSIS — R300 Dysuria: Secondary | ICD-10-CM

## 2023-10-27 MED ORDER — LEVOFLOXACIN 500 MG PO TABS: 500.0000 mg | ORAL_TABLET | Freq: Every day | ORAL | 0 refills | Status: AC

## 2023-11-27 ENCOUNTER — Other Ambulatory Visit: Payer: Self-pay | Admitting: Primary Care

## 2023-11-27 DIAGNOSIS — G8929 Other chronic pain: Secondary | ICD-10-CM

## 2023-11-30 ENCOUNTER — Other Ambulatory Visit: Payer: Self-pay | Admitting: Primary Care

## 2023-11-30 DIAGNOSIS — M1A9XX0 Chronic gout, unspecified, without tophus (tophi): Secondary | ICD-10-CM

## 2024-01-12 ENCOUNTER — Other Ambulatory Visit: Payer: Self-pay | Admitting: Primary Care

## 2024-01-12 DIAGNOSIS — F411 Generalized anxiety disorder: Secondary | ICD-10-CM

## 2024-01-25 ENCOUNTER — Other Ambulatory Visit: Payer: Self-pay | Admitting: Primary Care

## 2024-01-25 DIAGNOSIS — G8929 Other chronic pain: Secondary | ICD-10-CM
